# Patient Record
Sex: Male | Born: 1975
Health system: Southern US, Community
[De-identification: ages and names within clinical notes are randomized; demographics above are authoritative.]

## PROBLEM LIST (undated history)

## (undated) DIAGNOSIS — T7840XA Allergy, unspecified, initial encounter: Secondary | ICD-10-CM

## (undated) DIAGNOSIS — M86171 Other acute osteomyelitis, right ankle and foot: Secondary | ICD-10-CM

## (undated) DIAGNOSIS — G629 Polyneuropathy, unspecified: Secondary | ICD-10-CM

## (undated) DIAGNOSIS — M14671 Charcot's joint, right ankle and foot: Secondary | ICD-10-CM

## (undated) DIAGNOSIS — E875 Hyperkalemia: Secondary | ICD-10-CM

## (undated) DIAGNOSIS — N289 Disorder of kidney and ureter, unspecified: Secondary | ICD-10-CM

## (undated) DIAGNOSIS — I10 Essential (primary) hypertension: Secondary | ICD-10-CM

## (undated) DIAGNOSIS — IMO0002 Reserved for concepts with insufficient information to code with codable children: Secondary | ICD-10-CM

## (undated) DIAGNOSIS — E119 Type 2 diabetes mellitus without complications: Secondary | ICD-10-CM

## (undated) DIAGNOSIS — K219 Gastro-esophageal reflux disease without esophagitis: Secondary | ICD-10-CM

## (undated) DIAGNOSIS — E109 Type 1 diabetes mellitus without complications: Secondary | ICD-10-CM

## (undated) DIAGNOSIS — Z5189 Encounter for other specified aftercare: Secondary | ICD-10-CM

## (undated) DIAGNOSIS — L97411 Non-pressure chronic ulcer of right heel and midfoot limited to breakdown of skin: Secondary | ICD-10-CM

## (undated) DIAGNOSIS — G4730 Sleep apnea, unspecified: Secondary | ICD-10-CM

## (undated) DIAGNOSIS — D689 Coagulation defect, unspecified: Secondary | ICD-10-CM

## (undated) HISTORY — DX: Reserved for concepts with insufficient information to code with codable children: IMO0002

## (undated) HISTORY — DX: Disorder of kidney and ureter, unspecified: N28.9

## (undated) HISTORY — DX: Allergy, unspecified, initial encounter: T78.40XA

## (undated) HISTORY — DX: Encounter for other specified aftercare: Z51.89

## (undated) HISTORY — PX: APPENDECTOMY: SHX54

## (undated) HISTORY — DX: Coagulation defect, unspecified: D68.9

## (undated) HISTORY — DX: Gastro-esophageal reflux disease without esophagitis: K21.9

## (undated) HISTORY — PX: ANKLE SURGERY: SHX546

## (undated) HISTORY — PX: EYE SURGERY: SHX253

## (undated) HISTORY — DX: Type 2 diabetes mellitus without complications: E11.9

---

## 2011-03-26 ENCOUNTER — Ambulatory Visit (INDEPENDENT_AMBULATORY_CARE_PROVIDER_SITE_OTHER): Payer: BC Managed Care – PPO | Admitting: Ophthalmology

## 2011-03-26 DIAGNOSIS — H43819 Vitreous degeneration, unspecified eye: Secondary | ICD-10-CM

## 2011-03-26 DIAGNOSIS — H2510 Age-related nuclear cataract, unspecified eye: Secondary | ICD-10-CM

## 2011-03-26 DIAGNOSIS — E11359 Type 2 diabetes mellitus with proliferative diabetic retinopathy without macular edema: Secondary | ICD-10-CM

## 2011-04-25 ENCOUNTER — Other Ambulatory Visit: Payer: Self-pay | Admitting: Internal Medicine

## 2011-04-25 DIAGNOSIS — R7989 Other specified abnormal findings of blood chemistry: Secondary | ICD-10-CM

## 2011-04-25 DIAGNOSIS — R809 Proteinuria, unspecified: Secondary | ICD-10-CM

## 2011-05-02 ENCOUNTER — Ambulatory Visit
Admission: RE | Admit: 2011-05-02 | Discharge: 2011-05-02 | Disposition: A | Discharge status: Home / Self Care | Payer: BC Managed Care – PPO | Source: Ambulatory Visit | Attending: Internal Medicine | Admitting: Internal Medicine

## 2011-05-02 DIAGNOSIS — R809 Proteinuria, unspecified: Secondary | ICD-10-CM

## 2011-05-02 DIAGNOSIS — R7989 Other specified abnormal findings of blood chemistry: Secondary | ICD-10-CM

## 2011-08-13 ENCOUNTER — Other Ambulatory Visit (HOSPITAL_COMMUNITY): Payer: Self-pay | Admitting: *Deleted

## 2011-08-14 ENCOUNTER — Encounter (HOSPITAL_COMMUNITY)
Admission: RE | Admit: 2011-08-14 | Discharge: 2011-08-14 | Disposition: A | Discharge status: Home / Self Care | Payer: BC Managed Care – PPO | Source: Ambulatory Visit | Attending: Nephrology | Admitting: Nephrology

## 2011-08-14 DIAGNOSIS — D509 Iron deficiency anemia, unspecified: Secondary | ICD-10-CM | POA: Insufficient documentation

## 2011-08-14 MED ORDER — FERUMOXYTOL INJECTION 510 MG/17 ML
INTRAVENOUS | Status: AC
  Administered 2011-08-14: 510 mg via INTRAVENOUS
  Filled 2011-08-14: qty 17

## 2011-08-14 MED ORDER — FERUMOXYTOL INJECTION 510 MG/17 ML
510.0000 mg | INTRAVENOUS | Status: DC
  Administered 2011-08-14: 510 mg via INTRAVENOUS

## 2011-08-14 MED ORDER — SODIUM CHLORIDE 0.9 % IV SOLN
INTRAVENOUS | Status: DC
  Administered 2011-08-14: 250 mL via INTRAVENOUS

## 2011-08-21 ENCOUNTER — Encounter (HOSPITAL_COMMUNITY)
Admission: RE | Admit: 2011-08-21 | Discharge: 2011-08-21 | Disposition: A | Discharge status: Home / Self Care | Payer: BC Managed Care – PPO | Source: Ambulatory Visit | Attending: Nephrology | Admitting: Nephrology

## 2011-08-21 MED ORDER — FERUMOXYTOL INJECTION 510 MG/17 ML: 510.0000 mg | INTRAVENOUS | Status: DC

## 2011-08-21 MED ORDER — FERUMOXYTOL INJECTION 510 MG/17 ML
INTRAVENOUS | Status: AC
  Administered 2011-08-21: 510 mg via INTRAVENOUS
  Filled 2011-08-21: qty 17

## 2011-08-21 MED ORDER — SODIUM CHLORIDE 0.9 % IV SOLN
INTRAVENOUS | Status: AC
  Administered 2011-08-21: 09:00:00 via INTRAVENOUS

## 2011-09-24 ENCOUNTER — Ambulatory Visit (INDEPENDENT_AMBULATORY_CARE_PROVIDER_SITE_OTHER): Payer: BC Managed Care – PPO | Admitting: Ophthalmology

## 2011-09-24 DIAGNOSIS — E1065 Type 1 diabetes mellitus with hyperglycemia: Secondary | ICD-10-CM

## 2011-09-24 DIAGNOSIS — H2510 Age-related nuclear cataract, unspecified eye: Secondary | ICD-10-CM

## 2011-09-24 DIAGNOSIS — E1039 Type 1 diabetes mellitus with other diabetic ophthalmic complication: Secondary | ICD-10-CM

## 2011-09-24 DIAGNOSIS — E11359 Type 2 diabetes mellitus with proliferative diabetic retinopathy without macular edema: Secondary | ICD-10-CM

## 2011-09-24 DIAGNOSIS — H43819 Vitreous degeneration, unspecified eye: Secondary | ICD-10-CM

## 2011-09-24 DIAGNOSIS — H2700 Aphakia, unspecified eye: Secondary | ICD-10-CM

## 2012-03-26 ENCOUNTER — Ambulatory Visit (INDEPENDENT_AMBULATORY_CARE_PROVIDER_SITE_OTHER): Payer: BC Managed Care – PPO | Admitting: Ophthalmology

## 2012-03-26 DIAGNOSIS — H35039 Hypertensive retinopathy, unspecified eye: Secondary | ICD-10-CM

## 2012-03-26 DIAGNOSIS — H43819 Vitreous degeneration, unspecified eye: Secondary | ICD-10-CM

## 2012-03-26 DIAGNOSIS — E11359 Type 2 diabetes mellitus with proliferative diabetic retinopathy without macular edema: Secondary | ICD-10-CM

## 2012-03-26 DIAGNOSIS — E1165 Type 2 diabetes mellitus with hyperglycemia: Secondary | ICD-10-CM

## 2012-03-26 DIAGNOSIS — I10 Essential (primary) hypertension: Secondary | ICD-10-CM

## 2012-03-26 DIAGNOSIS — E1139 Type 2 diabetes mellitus with other diabetic ophthalmic complication: Secondary | ICD-10-CM

## 2012-07-21 ENCOUNTER — Other Ambulatory Visit (HOSPITAL_COMMUNITY): Payer: Self-pay

## 2012-07-22 ENCOUNTER — Other Ambulatory Visit (HOSPITAL_COMMUNITY): Payer: Self-pay

## 2012-07-22 DIAGNOSIS — R0989 Other specified symptoms and signs involving the circulatory and respiratory systems: Secondary | ICD-10-CM

## 2012-08-08 ENCOUNTER — Encounter (HOSPITAL_COMMUNITY): Payer: BC Managed Care – PPO

## 2012-08-26 ENCOUNTER — Ambulatory Visit (HOSPITAL_COMMUNITY)
Admission: RE | Admit: 2012-08-26 | Discharge: 2012-08-26 | Disposition: A | Discharge status: Home / Self Care | Payer: BC Managed Care – PPO | Source: Ambulatory Visit | Attending: Cardiovascular Disease | Admitting: Cardiovascular Disease

## 2012-08-26 DIAGNOSIS — R0989 Other specified symptoms and signs involving the circulatory and respiratory systems: Secondary | ICD-10-CM | POA: Insufficient documentation

## 2012-08-26 NOTE — Progress Notes (Signed)
ABIs and lower extremity duplex doppler completed. Aris [place] RVT

## 2012-09-24 ENCOUNTER — Ambulatory Visit (INDEPENDENT_AMBULATORY_CARE_PROVIDER_SITE_OTHER): Payer: BC Managed Care – PPO | Admitting: Ophthalmology

## 2012-09-24 DIAGNOSIS — H43819 Vitreous degeneration, unspecified eye: Secondary | ICD-10-CM

## 2012-09-24 DIAGNOSIS — E1139 Type 2 diabetes mellitus with other diabetic ophthalmic complication: Secondary | ICD-10-CM

## 2012-09-24 DIAGNOSIS — E11359 Type 2 diabetes mellitus with proliferative diabetic retinopathy without macular edema: Secondary | ICD-10-CM

## 2012-09-24 DIAGNOSIS — H35039 Hypertensive retinopathy, unspecified eye: Secondary | ICD-10-CM

## 2012-09-24 DIAGNOSIS — H35379 Puckering of macula, unspecified eye: Secondary | ICD-10-CM

## 2012-09-24 DIAGNOSIS — H2510 Age-related nuclear cataract, unspecified eye: Secondary | ICD-10-CM

## 2012-09-24 DIAGNOSIS — I10 Essential (primary) hypertension: Secondary | ICD-10-CM

## 2013-01-15 ENCOUNTER — Ambulatory Visit
Admission: RE | Admit: 2013-01-15 | Discharge: 2013-01-15 | Disposition: A | Discharge status: Home / Self Care | Payer: BC Managed Care – PPO | Source: Ambulatory Visit | Attending: Nephrology | Admitting: Nephrology

## 2013-01-15 ENCOUNTER — Other Ambulatory Visit: Payer: Self-pay | Admitting: Nephrology

## 2013-01-15 DIAGNOSIS — R509 Fever, unspecified: Secondary | ICD-10-CM

## 2013-01-15 DIAGNOSIS — R059 Cough, unspecified: Secondary | ICD-10-CM

## 2013-01-15 DIAGNOSIS — R05 Cough: Secondary | ICD-10-CM

## 2013-03-27 ENCOUNTER — Ambulatory Visit (INDEPENDENT_AMBULATORY_CARE_PROVIDER_SITE_OTHER): Payer: BC Managed Care – PPO | Admitting: Ophthalmology

## 2013-03-27 DIAGNOSIS — I10 Essential (primary) hypertension: Secondary | ICD-10-CM

## 2013-03-27 DIAGNOSIS — E1139 Type 2 diabetes mellitus with other diabetic ophthalmic complication: Secondary | ICD-10-CM

## 2013-03-27 DIAGNOSIS — H43819 Vitreous degeneration, unspecified eye: Secondary | ICD-10-CM

## 2013-03-27 DIAGNOSIS — H35039 Hypertensive retinopathy, unspecified eye: Secondary | ICD-10-CM

## 2013-03-27 DIAGNOSIS — H35379 Puckering of macula, unspecified eye: Secondary | ICD-10-CM

## 2013-03-27 DIAGNOSIS — H2510 Age-related nuclear cataract, unspecified eye: Secondary | ICD-10-CM

## 2013-03-27 DIAGNOSIS — E11359 Type 2 diabetes mellitus with proliferative diabetic retinopathy without macular edema: Secondary | ICD-10-CM

## 2013-04-14 ENCOUNTER — Ambulatory Visit (INDEPENDENT_AMBULATORY_CARE_PROVIDER_SITE_OTHER): Payer: BC Managed Care – PPO

## 2013-04-14 VITALS — BP 126/75 | HR 77 | Resp 12 | Ht 66.0 in | Wt 198.0 lb

## 2013-04-14 DIAGNOSIS — M79609 Pain in unspecified limb: Secondary | ICD-10-CM

## 2013-04-14 DIAGNOSIS — E1049 Type 1 diabetes mellitus with other diabetic neurological complication: Secondary | ICD-10-CM

## 2013-04-14 DIAGNOSIS — E1040 Type 1 diabetes mellitus with diabetic neuropathy, unspecified: Secondary | ICD-10-CM

## 2013-04-14 DIAGNOSIS — B351 Tinea unguium: Secondary | ICD-10-CM

## 2013-04-14 DIAGNOSIS — E1142 Type 2 diabetes mellitus with diabetic polyneuropathy: Secondary | ICD-10-CM

## 2013-04-14 NOTE — Patient Instructions (Signed)
Diabetes and Foot Care Diabetes may cause you to have problems because of poor blood supply (circulation) to your feet and legs. This may cause the skin on your feet to become thinner, break easier, and heal more slowly. Your skin may become dry, and the skin may peel and crack. You may also have nerve damage in your legs and feet causing decreased feeling in them. You may not notice minor injuries to your feet that could lead to infections or more serious problems. Taking care of your feet is one of the most important things you can do for yourself.  HOME CARE INSTRUCTIONS  Wear shoes at all times, even in the house. Do not go barefoot. Bare feet are easily injured.  Check your feet daily for blisters, cuts, and redness. If you cannot see the bottom of your feet, use a mirror or ask someone for help.  Wash your feet with warm water (do not use hot water) and mild soap. Then pat your feet and the areas between your toes until they are completely dry. Do not soak your feet as this can dry your skin.  Apply a moisturizing lotion or petroleum jelly (that does not contain alcohol and is unscented) to the skin on your feet and to dry, brittle toenails. Do not apply lotion between your toes.  Trim your toenails straight across. Do not dig under them or around the cuticle. File the edges of your nails with an emery board or nail file.  Do not cut corns or calluses or try to remove them with medicine.  Wear clean socks or stockings every day. Make sure they are not too tight. Do not wear knee-high stockings since they may decrease blood flow to your legs.  Wear shoes that fit properly and have enough cushioning. To break in new shoes, wear them for just a few hours a day. This prevents you from injuring your feet. Always look in your shoes before you put them on to be sure there are no objects inside.  Do not cross your legs. This may decrease the blood flow to your feet.  If you find a minor scrape,  cut, or break in the skin on your feet, keep it and the skin around it clean and dry. These areas may be cleansed with mild soap and water. Do not cleanse the area with peroxide, alcohol, or iodine.  When you remove an adhesive bandage, be sure not to damage the skin around it.  If you have a wound, look at it several times a day to make sure it is healing.  Do not use heating pads or hot water bottles. They may burn your skin. If you have lost feeling in your feet or legs, you may not know it is happening until it is too late.  Make sure your health care provider performs a complete foot exam at least annually or more often if you have foot problems. Report any cuts, sores, or bruises to your health care provider immediately. SEEK MEDICAL CARE IF:   You have an injury that is not healing.  You have cuts or breaks in the skin.  You have an ingrown nail.  You notice redness on your legs or feet.  You feel burning or tingling in your legs or feet.  You have pain or cramps in your legs and feet.  Your legs or feet are numb.  Your feet always feel cold. SEEK IMMEDIATE MEDICAL CARE IF:   There is increasing redness,   swelling, or pain in or around a wound.  There is a red line that goes up your leg.  Pus is coming from a wound.  You develop a fever or as directed by your health care provider.  You notice a bad smell coming from an ulcer or wound. Document Released: 05/18/2000 Document Revised: 01/21/2013 Document Reviewed: 10/28/2012 ExitCare Patient Information 2014 [employer].  

## 2013-04-14 NOTE — Progress Notes (Signed)
  Subjective:    Patient ID: Javier Hunt, male    DOB: July 27, 1975, 37 y.o.   MRN: [id number]  HPI Comments: '' toenails trim''     Review of Systems  Respiratory: Positive for wheezing.   All other systems reviewed and are negative.       Objective:   Physical Exam Patient's last A1c was 8.6 continues to have difficulty with his diabetes. Patient is on insulin pump insulin-dependent diabetes. The extremity objective findings as follows vascular status intact DP postal for PT pulse one over 4 bilateral Refill timed 3-4 seconds all digits neurologically epicritic and proprioceptive sensations significantly diminished on Semmes Weinstein testing to forefoot and plantar digits and plantar arch bilateral. DTRs not elicited. There is normal plantar response noted. Dermatologically nails thick friable yellow discolored and brittle, there is also some dry scaling skin.       Assessment & Plan:  Diabetes with peripheral neuropathy no changes medication her health status continues to have difficulty with control, patient works third shifts has difficulty with his diet and medication scheduled. Continues to have thick brittle friable mycotic nails debrided x10 the presence of diabetes cocking factors as well as onychomycosis painful and tender nails debrided reappointed 3 months for continued palliative diabetic foot and nail care  Harriet Masson DPM

## 2013-06-16 ENCOUNTER — Ambulatory Visit (INDEPENDENT_AMBULATORY_CARE_PROVIDER_SITE_OTHER): Payer: BC Managed Care – PPO

## 2013-06-16 VITALS — BP 121/74 | HR 73 | Resp 16 | Ht 66.0 in | Wt 194.0 lb

## 2013-06-16 DIAGNOSIS — IMO0002 Reserved for concepts with insufficient information to code with codable children: Secondary | ICD-10-CM

## 2013-06-16 DIAGNOSIS — M79609 Pain in unspecified limb: Secondary | ICD-10-CM

## 2013-06-16 DIAGNOSIS — E1142 Type 2 diabetes mellitus with diabetic polyneuropathy: Secondary | ICD-10-CM

## 2013-06-16 DIAGNOSIS — E1065 Type 1 diabetes mellitus with hyperglycemia: Secondary | ICD-10-CM

## 2013-06-16 DIAGNOSIS — B351 Tinea unguium: Secondary | ICD-10-CM

## 2013-06-16 DIAGNOSIS — E1040 Type 1 diabetes mellitus with diabetic neuropathy, unspecified: Secondary | ICD-10-CM

## 2013-06-16 DIAGNOSIS — E1049 Type 1 diabetes mellitus with other diabetic neurological complication: Secondary | ICD-10-CM

## 2013-06-16 NOTE — Progress Notes (Signed)
   Subjective:    Patient ID: Javier Hunt, male    DOB: 04/09/1976, 38 y.o.   MRN: [id number]  HPI Comments: Toenail clipping today.     Review of Systems no new changes or findings     Objective:   Physical Exam Neurovascular status is intact pedal pulses palpable DP +2/4 bilateral PT plus one over 4 bilateral. Capillary refill time 4 seconds all digits. Neurologically epicritic and proprioceptive sensations intact although diminished on Semmes Weinstein testing to the digits and plantar forefoot. There is normal plantar response DTRs not listed. Dermatologically skin color pigment and hair growth are normal nails thick yellow brittle discolored friable consistent with onychomycosis. They're painful tender both on debridement and with enclosed shoe wear and ambulation. Patient been using Naftin cream for the athletes foot and this completed suggested topical antifungal such as over-the-counter Fungi-Nail to the affected nails twice daily for 12 months duration.       Assessment & Plan:  Assessment diabetes with peripheral neuropathy and angiopathy. Mycotic friable discolored nails debrided x10 the presence of diabetes and complications initiate topical antifungal therapy with Fungi-Nail recheck in 3 months for continued palliative nail care.  Harriet Masson DPM

## 2013-06-16 NOTE — Patient Instructions (Signed)
Onychomycosis/Fungal Toenails  WHAT IS IT? An infection that lies within the keratin of your nail plate that is caused by a fungus.  WHY ME? Fungal infections affect all ages, sexes, races, and creeds.  There may be many factors that predispose you to a fungal infection such as age, coexisting medical conditions such as diabetes, or an autoimmune disease; stress, medications, fatigue, genetics, etc.  Bottom line: fungus thrives in a warm, moist environment and your shoes offer such a location.  IS IT CONTAGIOUS? Theoretically, yes.  You do not want to share shoes, nail clippers or files with someone who has fungal toenails.  Walking around barefoot in the same room or sleeping in the same bed is unlikely to transfer the organism.  It is important to realize, however, that fungus can spread easily from one nail to the next on the same foot.  HOW DO WE TREAT THIS?  There are several ways to treat this condition.  Treatment may depend on many factors such as age, medications, pregnancy, liver and kidney conditions, etc.  It is best to ask your doctor which options are available to you.  1. No treatment.   Unlike many other medical concerns, you can live with this condition.  However for many people this can be a painful condition and may lead to ingrown toenails or a bacterial infection.  It is recommended that you keep the nails cut short to help reduce the amount of fungal nail. 2. Topical treatment.  These range from herbal remedies to prescription strength nail lacquers.  About 40-50% effective, topicals require twice daily application for approximately 9 to 12 months or until an entirely new nail has grown out.  The most effective topicals are medical grade medications available through physicians offices. 3. Oral antifungal medications.  With an 80-90% cure rate, the most common oral medication requires 3 to 4 months of therapy and stays in your system for a year as the new nail grows out.  Oral  antifungal medications do require blood work to make sure it is a safe drug for you.  A liver function panel will be performed prior to starting the medication and after the first month of treatment.  It is important to have the blood work performed to avoid any harmful side effects.  In general, this medication safe but blood work is required. 4. Laser Therapy.  This treatment is performed by applying a specialized laser to the affected nail plate.  This therapy is noninvasive, fast, and non-painful.  It is not covered by insurance and is therefore, out of pocket.  The results have been very good with a 80-95% cure rate.  The [facility] is the only practice in the area to offer this therapy. 5. Permanent Nail Avulsion.  Removing the entire nail so that a new nail will not grow back.  Recommendations to obtain over-the-counter topical antifungal such as Fungi-Nail, can be obtained at any pharmacy without prescription. Apply the medication twice daily to the affected nails for 12 months duration apply a very thin coat each affected nails every morning every evening after bath or shower reapply again. Discontinue only for causing skin irritation or rash it will take a minimum of one year to begin improving the nails.

## 2013-09-08 ENCOUNTER — Ambulatory Visit: Payer: BC Managed Care – PPO

## 2013-09-28 ENCOUNTER — Ambulatory Visit (INDEPENDENT_AMBULATORY_CARE_PROVIDER_SITE_OTHER): Payer: BC Managed Care – PPO | Admitting: Ophthalmology

## 2013-09-28 DIAGNOSIS — H43819 Vitreous degeneration, unspecified eye: Secondary | ICD-10-CM

## 2013-09-28 DIAGNOSIS — H35039 Hypertensive retinopathy, unspecified eye: Secondary | ICD-10-CM

## 2013-09-28 DIAGNOSIS — E11359 Type 2 diabetes mellitus with proliferative diabetic retinopathy without macular edema: Secondary | ICD-10-CM

## 2013-09-28 DIAGNOSIS — E1039 Type 1 diabetes mellitus with other diabetic ophthalmic complication: Secondary | ICD-10-CM

## 2013-09-28 DIAGNOSIS — H35379 Puckering of macula, unspecified eye: Secondary | ICD-10-CM

## 2013-09-28 DIAGNOSIS — H2510 Age-related nuclear cataract, unspecified eye: Secondary | ICD-10-CM

## 2013-09-28 DIAGNOSIS — I10 Essential (primary) hypertension: Secondary | ICD-10-CM

## 2013-09-28 DIAGNOSIS — E1065 Type 1 diabetes mellitus with hyperglycemia: Secondary | ICD-10-CM

## 2013-10-06 ENCOUNTER — Ambulatory Visit (INDEPENDENT_AMBULATORY_CARE_PROVIDER_SITE_OTHER): Payer: BC Managed Care – PPO

## 2013-10-06 VITALS — BP 102/56 | HR 75 | Resp 16

## 2013-10-06 DIAGNOSIS — E1040 Type 1 diabetes mellitus with diabetic neuropathy, unspecified: Secondary | ICD-10-CM

## 2013-10-06 DIAGNOSIS — M79609 Pain in unspecified limb: Secondary | ICD-10-CM

## 2013-10-06 DIAGNOSIS — E1049 Type 1 diabetes mellitus with other diabetic neurological complication: Secondary | ICD-10-CM

## 2013-10-06 DIAGNOSIS — B351 Tinea unguium: Secondary | ICD-10-CM

## 2013-10-06 DIAGNOSIS — E1065 Type 1 diabetes mellitus with hyperglycemia: Secondary | ICD-10-CM

## 2013-10-06 DIAGNOSIS — IMO0002 Reserved for concepts with insufficient information to code with codable children: Secondary | ICD-10-CM

## 2013-10-06 DIAGNOSIS — E1142 Type 2 diabetes mellitus with diabetic polyneuropathy: Secondary | ICD-10-CM

## 2013-10-06 MED ORDER — TAVABOROLE 5 % EX SOLN: CUTANEOUS | Status: DC

## 2013-10-06 NOTE — Progress Notes (Signed)
   Subjective:    Patient ID: Javier Hunt, male    DOB: 1975-11-19, 38 y.o.   MRN: [id number]  HPI Comments: "Trim the toenails"     Review of Systems no new systemic changes or findings noted    Objective:   Physical Exam Lower extremity objective findings follows vascular status is intact with DP +2/4 PT plus one over 4 bilateral capillary refill time 4 seconds all digits epicritic and proprioceptive sensations decreased on Semmes Weinstein testing to the toes and plantar arch. There is thickening friability discoloration nails with dark discoloration and brittleness hallux through fifth digits bilateral painful tender symptomatically on palpation and with ambulation include shoe wear. Patient been using topical Naftin cream we discussed Fungi-Nail my recommendation is that is a prescription prescription for Cranford Mon [facility] patient will initiate daily treatments for the next 12 months for the fungus nail       Assessment & Plan:  Assessment diabetes with peripheral neuropathy and complications painful mycotic dystrophic brittle nails debrided 1 through 5 bilateral also need keratin is forwarded to the pharmacy reappointed 3 months for continued palliative nail care as needed  Harriet Masson DPM

## 2013-10-06 NOTE — Patient Instructions (Signed)
Diabetes and Foot Care Diabetes may cause you to have problems because of poor blood supply (circulation) to your feet and legs. This may cause the skin on your feet to become thinner, break easier, and heal more slowly. Your skin may become dry, and the skin may peel and crack. You may also have nerve damage in your legs and feet causing decreased feeling in them. You may not notice minor injuries to your feet that could lead to infections or more serious problems. Taking care of your feet is one of the most important things you can do for yourself.  HOME CARE INSTRUCTIONS  Wear shoes at all times, even in the house. Do not go barefoot. Bare feet are easily injured.  Check your feet daily for blisters, cuts, and redness. If you cannot see the bottom of your feet, use a mirror or ask someone for help.  Wash your feet with warm water (do not use hot water) and mild soap. Then pat your feet and the areas between your toes until they are completely dry. Do not soak your feet as this can dry your skin.  Apply a moisturizing lotion or petroleum jelly (that does not contain alcohol and is unscented) to the skin on your feet and to dry, brittle toenails. Do not apply lotion between your toes.  Trim your toenails straight across. Do not dig under them or around the cuticle. File the edges of your nails with an emery board or nail file.  Do not cut corns or calluses or try to remove them with medicine.  Wear clean socks or stockings every day. Make sure they are not too tight. Do not wear knee-high stockings since they may decrease blood flow to your legs.  Wear shoes that fit properly and have enough cushioning. To break in new shoes, wear them for just a few hours a day. This prevents you from injuring your feet. Always look in your shoes before you put them on to be sure there are no objects inside.  Do not cross your legs. This may decrease the blood flow to your feet.  If you find a minor scrape,  cut, or break in the skin on your feet, keep it and the skin around it clean and dry. These areas may be cleansed with mild soap and water. Do not cleanse the area with peroxide, alcohol, or iodine.  When you remove an adhesive bandage, be sure not to damage the skin around it.  If you have a wound, look at it several times a day to make sure it is healing.  Do not use heating pads or hot water bottles. They may burn your skin. If you have lost feeling in your feet or legs, you may not know it is happening until it is too late.  Make sure your health care provider performs a complete foot exam at least annually or more often if you have foot problems. Report any cuts, sores, or bruises to your health care provider immediately. SEEK MEDICAL CARE IF:   You have an injury that is not healing.  You have cuts or breaks in the skin.  You have an ingrown nail.  You notice redness on your legs or feet.  You feel burning or tingling in your legs or feet.  You have pain or cramps in your legs and feet.  Your legs or feet are numb.  Your feet always feel cold. SEEK IMMEDIATE MEDICAL CARE IF:   There is increasing redness,   swelling, or pain in or around a wound.  There is a red line that goes up your leg.  Pus is coming from a wound.  You develop a fever or as directed by your health care provider.  You notice a bad smell coming from an ulcer or wound. Document Released: 05/18/2000 Document Revised: 01/21/2013 Document Reviewed: 10/28/2012 ExitCare Patient Information 2014 [employer].  

## 2014-01-12 ENCOUNTER — Ambulatory Visit (INDEPENDENT_AMBULATORY_CARE_PROVIDER_SITE_OTHER): Payer: BC Managed Care – PPO

## 2014-01-12 DIAGNOSIS — E1049 Type 1 diabetes mellitus with other diabetic neurological complication: Secondary | ICD-10-CM

## 2014-01-12 DIAGNOSIS — E1040 Type 1 diabetes mellitus with diabetic neuropathy, unspecified: Secondary | ICD-10-CM

## 2014-01-12 DIAGNOSIS — E1065 Type 1 diabetes mellitus with hyperglycemia: Secondary | ICD-10-CM

## 2014-01-12 DIAGNOSIS — M79609 Pain in unspecified limb: Secondary | ICD-10-CM

## 2014-01-12 DIAGNOSIS — B351 Tinea unguium: Secondary | ICD-10-CM

## 2014-01-12 DIAGNOSIS — M79606 Pain in leg, unspecified: Secondary | ICD-10-CM

## 2014-01-12 DIAGNOSIS — E1142 Type 2 diabetes mellitus with diabetic polyneuropathy: Secondary | ICD-10-CM

## 2014-01-12 DIAGNOSIS — IMO0002 Reserved for concepts with insufficient information to code with codable children: Secondary | ICD-10-CM

## 2014-01-12 NOTE — Progress Notes (Signed)
   Subjective:    Patient ID: Javier Hunt, male    DOB: [birth year], 38 y.o.   MRN: [id number]  HPI Comments: Pt presents for debridement of 10 toenails.     Review of Systems no new findings or systemic changes     Objective:   Physical Exam Lower extremity objective findings reveal pedal pulses palpable DP +2/4 bilateral PT one over 4 bilateral capillary refill time 3 seconds decreased sensation Semmes Weinstein to forefoot and digits there is normal plantar response DTRs not elicited neurologically skin color pigment normal hair growth absent nails thick brittle friable crumbly and dystrophic. Patient is been applying topical Kerydin as instructed advised to continue to do so for leaks 12 months duration it may be some early proximal clearing noted no open wounds or ulcers no secondary infection is noted current time mild flexible digital contractures noted       Assessment & Plan:  Assessment diabetes with peripheral neuropathy and complications painful mycotic dystrophic nails 1 through 5 bilateral debridement at this time return in 3 months for continued palliative care in the future. Continue topical Kerydin applications as instructed next  [employer] DPM

## 2014-01-12 NOTE — Patient Instructions (Signed)

## 2014-04-01 ENCOUNTER — Ambulatory Visit (INDEPENDENT_AMBULATORY_CARE_PROVIDER_SITE_OTHER): Payer: BC Managed Care – PPO | Admitting: Ophthalmology

## 2014-04-01 DIAGNOSIS — H35033 Hypertensive retinopathy, bilateral: Secondary | ICD-10-CM

## 2014-04-01 DIAGNOSIS — I10 Essential (primary) hypertension: Secondary | ICD-10-CM

## 2014-04-01 DIAGNOSIS — H43813 Vitreous degeneration, bilateral: Secondary | ICD-10-CM

## 2014-04-01 DIAGNOSIS — H35371 Puckering of macula, right eye: Secondary | ICD-10-CM

## 2014-04-01 DIAGNOSIS — E10351 Type 1 diabetes mellitus with proliferative diabetic retinopathy with macular edema: Secondary | ICD-10-CM

## 2014-04-01 DIAGNOSIS — E10359 Type 1 diabetes mellitus with proliferative diabetic retinopathy without macular edema: Secondary | ICD-10-CM

## 2014-04-01 DIAGNOSIS — E10311 Type 1 diabetes mellitus with unspecified diabetic retinopathy with macular edema: Secondary | ICD-10-CM

## 2014-04-20 ENCOUNTER — Ambulatory Visit (INDEPENDENT_AMBULATORY_CARE_PROVIDER_SITE_OTHER): Payer: BC Managed Care – PPO

## 2014-04-20 VITALS — BP 108/68 | HR 77 | Resp 18

## 2014-04-20 DIAGNOSIS — B351 Tinea unguium: Secondary | ICD-10-CM

## 2014-04-20 DIAGNOSIS — E1041 Type 1 diabetes mellitus with diabetic mononeuropathy: Secondary | ICD-10-CM

## 2014-04-20 DIAGNOSIS — E1065 Type 1 diabetes mellitus with hyperglycemia: Secondary | ICD-10-CM

## 2014-04-20 DIAGNOSIS — M79606 Pain in leg, unspecified: Secondary | ICD-10-CM

## 2014-04-20 DIAGNOSIS — E1040 Type 1 diabetes mellitus with diabetic neuropathy, unspecified: Secondary | ICD-10-CM

## 2014-04-20 DIAGNOSIS — IMO0002 Reserved for concepts with insufficient information to code with codable children: Secondary | ICD-10-CM

## 2014-04-20 NOTE — Progress Notes (Signed)
   Subjective:    Patient ID: Javier Hunt, male    DOB: [birth year], 38 y.o.   MRN: [id number]  HPI i am here to get my toenails trimmed up    Review of Systemsno new findings or systemic changes noted     Objective:   Physical Exam Lower extremity objective findings reveal vascular status intact DP +2 PT 1 over 4 bilateral capillary refill time 3 seconds. Normal plantar response DTRs not elicited decreased sensation to the forefoot digits and arch. Nails thick crumbly friable dystrophic 1 through 5 bilateral. No open wounds no ulcers no secondary infections. Mild digital contractures slight hammertoe deformities noted bilateral       Assessment & Plan:  Assessment diabetes with history peripheral neuropathy and complications patient does have painful mycotic dystrophic nails 1 through 5 bilateral which are debrided at this time. Recommended 3 month follow-up for palliative care in the future use topical antifungal as instructed follow-up in 3 months  Harriet Masson DPM

## 2014-06-23 ENCOUNTER — Telehealth: Payer: Self-pay | Admitting: *Deleted

## 2014-06-23 NOTE — Telephone Encounter (Signed)
"  I'm calling to check on the status of her authorization for Kerydin."  We just sent the information to [facility].  We are waiting on a response.  "Okay, it is still pending.  Thank you very much."

## 2014-06-23 NOTE — Telephone Encounter (Signed)
Request for authorization of Cranford Mon was sent to [facility] for approval.  Diagnosis is Onychomycosis. Patient has tried Fungi Nail and Kerydin in the past.  She is Diabetic with Neuropathy.  Cannot take oral medications, already on medications that can make liver toxic.  Nails are friable, brittle and discolored.  Patient has tried OTC medications, vinegar, listerine, etc with no success per Dr. Blenda Mounts.

## 2014-07-12 NOTE — Telephone Encounter (Signed)
[employer] sent a request for documentation to review to make decision about authorization of Kerydin.  I faxed the chart notes.

## 2014-07-20 ENCOUNTER — Ambulatory Visit (INDEPENDENT_AMBULATORY_CARE_PROVIDER_SITE_OTHER): Payer: BLUE CROSS/BLUE SHIELD

## 2014-07-20 VITALS — BP 106/62 | HR 76 | Resp 12

## 2014-07-20 DIAGNOSIS — E1065 Type 1 diabetes mellitus with hyperglycemia: Secondary | ICD-10-CM

## 2014-07-20 DIAGNOSIS — IMO0002 Reserved for concepts with insufficient information to code with codable children: Secondary | ICD-10-CM

## 2014-07-20 DIAGNOSIS — M79676 Pain in unspecified toe(s): Secondary | ICD-10-CM

## 2014-07-20 DIAGNOSIS — E1041 Type 1 diabetes mellitus with diabetic mononeuropathy: Secondary | ICD-10-CM

## 2014-07-20 DIAGNOSIS — E1040 Type 1 diabetes mellitus with diabetic neuropathy, unspecified: Secondary | ICD-10-CM

## 2014-07-20 DIAGNOSIS — B351 Tinea unguium: Secondary | ICD-10-CM

## 2014-07-20 DIAGNOSIS — M79606 Pain in leg, unspecified: Secondary | ICD-10-CM

## 2014-07-20 NOTE — Progress Notes (Signed)
   Subjective:    Patient ID: Savir Erdmann, male    DOB: Feb 12, 1976, 39 y.o.   MRN: [id number]  HPI patient presents this time for nail care Toenails trim.  Review of Systems no new findings or changes noted     Objective:   Physical Exam Vascular status appears to be intact DP +2 PT 1 over 4 bilateral Refill time 3 seconds. Plantar response DTRs not noted. Dermatologic the skin color pigment normal hair growth absent nails thick crumbly friable discolored 1 through 5 bilateral. No infections no open wounds no ulcers no secondary infections.       Assessment & Plan:  Assessment diabetes history peripheral neuropathy and angiopathy. Painful mycotic nails debrided 10 for future palliative care is needed patient should note his blood sugars been fluctuating last A1c back in January was 8.1 likely due to the holidays elevating his blood sugars. You monitor recheck in 3 months as recommended  Harriet Masson DPM

## 2014-07-20 NOTE — Patient Instructions (Signed)
Diabetes and Foot Care Diabetes may cause you to have problems because of poor blood supply (circulation) to your feet and legs. This may cause the skin on your feet to become thinner, break easier, and heal more slowly. Your skin may become dry, and the skin may peel and crack. You may also have nerve damage in your legs and feet causing decreased feeling in them. You may not notice minor injuries to your feet that could lead to infections or more serious problems. Taking care of your feet is one of the most important things you can do for yourself.  HOME CARE INSTRUCTIONS  Wear shoes at all times, even in the house. Do not go barefoot. Bare feet are easily injured.  Check your feet daily for blisters, cuts, and redness. If you cannot see the bottom of your feet, use a mirror or ask someone for help.  Wash your feet with warm water (do not use hot water) and mild soap. Then pat your feet and the areas between your toes until they are completely dry. Do not soak your feet as this can dry your skin.  Apply a moisturizing lotion or petroleum jelly (that does not contain alcohol and is unscented) to the skin on your feet and to dry, brittle toenails. Do not apply lotion between your toes.  Trim your toenails straight across. Do not dig under them or around the cuticle. File the edges of your nails with an emery board or nail file.  Do not cut corns or calluses or try to remove them with medicine.  Wear clean socks or stockings every day. Make sure they are not too tight. Do not wear knee-high stockings since they may decrease blood flow to your legs.  Wear shoes that fit properly and have enough cushioning. To break in new shoes, wear them for just a few hours a day. This prevents you from injuring your feet. Always look in your shoes before you put them on to be sure there are no objects inside.  Do not cross your legs. This may decrease the blood flow to your feet.  If you find a minor scrape,  cut, or break in the skin on your feet, keep it and the skin around it clean and dry. These areas may be cleansed with mild soap and water. Do not cleanse the area with peroxide, alcohol, or iodine.  When you remove an adhesive bandage, be sure not to damage the skin around it.  If you have a wound, look at it several times a day to make sure it is healing.  Do not use heating pads or hot water bottles. They may burn your skin. If you have lost feeling in your feet or legs, you may not know it is happening until it is too late.  Make sure your health care provider performs a complete foot exam at least annually or more often if you have foot problems. Report any cuts, sores, or bruises to your health care provider immediately. SEEK MEDICAL CARE IF:   You have an injury that is not healing.  You have cuts or breaks in the skin.  You have an ingrown nail.  You notice redness on your legs or feet.  You feel burning or tingling in your legs or feet.  You have pain or cramps in your legs and feet.  Your legs or feet are numb.  Your feet always feel cold. SEEK IMMEDIATE MEDICAL CARE IF:   There is increasing redness,   swelling, or pain in or around a wound.  There is a red line that goes up your leg.  Pus is coming from a wound.  You develop a fever or as directed by your health care provider.  You notice a bad smell coming from an ulcer or wound. Document Released: 05/18/2000 Document Revised: 01/21/2013 Document Reviewed: 10/28/2012 Exit[employer] Patient Information 2015 [employer]. This information is not intended to replace advice given to you by your health care provider. Make sure you discuss any questions you have with your health care provider.  

## 2014-08-03 ENCOUNTER — Telehealth: Payer: Self-pay | Admitting: *Deleted

## 2014-08-03 NOTE — Telephone Encounter (Signed)
Patient will need to be placed on Penlac. Offer to prevent: A prescription for Penlac nail lacquer 6.6 mL bottle with 4 refills. Apply once daily to affected nails for 12 months removed once weekly or every 7 days using alcohol and repeat daily applications thereafter. Patient needs to try one of these generic for Penlac treatments rather than other options.  Harriet Masson DPM

## 2014-08-03 NOTE — Telephone Encounter (Signed)
Prescription for Cranford Mon was denied.  Diagnosis of Onychomycosis is needed as well as failure of Penlac, Sporanox or Lamisil.

## 2014-08-04 ENCOUNTER — Telehealth: Payer: Self-pay | Admitting: *Deleted

## 2014-08-04 MED ORDER — CICLOPIROX 8 % EX SOLN: Freq: Every day | CUTANEOUS | Status: DC

## 2014-08-04 NOTE — Telephone Encounter (Signed)
I left a message for the patient to call me back.  Need to inform him Cranford Mon was denied authorization from his insurance.  Dr. Blenda Mounts wants to prescribe Penlac.

## 2014-08-09 MED ORDER — CICLOPIROX 8 % EX SOLN: Freq: Every day | CUTANEOUS | Status: DC

## 2014-08-09 NOTE — Telephone Encounter (Signed)
I called and informed patient that his insurance denied Kerydin.  Dr. Blenda Mounts wants to prescribe Penlac which is an alternative that your insurance recommended.  Would you like to get this medication?  It is a topical as well.  "Yes, send it to [facility] on [facility]."  I will send it now.

## 2014-10-04 ENCOUNTER — Ambulatory Visit (INDEPENDENT_AMBULATORY_CARE_PROVIDER_SITE_OTHER): Payer: BLUE CROSS/BLUE SHIELD | Admitting: Ophthalmology

## 2014-10-04 DIAGNOSIS — E10319 Type 1 diabetes mellitus with unspecified diabetic retinopathy without macular edema: Secondary | ICD-10-CM | POA: Diagnosis not present

## 2014-10-04 DIAGNOSIS — E10359 Type 1 diabetes mellitus with proliferative diabetic retinopathy without macular edema: Secondary | ICD-10-CM

## 2014-10-04 DIAGNOSIS — H35373 Puckering of macula, bilateral: Secondary | ICD-10-CM | POA: Diagnosis not present

## 2014-10-04 DIAGNOSIS — H35033 Hypertensive retinopathy, bilateral: Secondary | ICD-10-CM

## 2014-10-04 DIAGNOSIS — H43813 Vitreous degeneration, bilateral: Secondary | ICD-10-CM

## 2014-10-04 DIAGNOSIS — H2513 Age-related nuclear cataract, bilateral: Secondary | ICD-10-CM

## 2014-10-04 DIAGNOSIS — I10 Essential (primary) hypertension: Secondary | ICD-10-CM | POA: Diagnosis not present

## 2014-10-19 ENCOUNTER — Ambulatory Visit (INDEPENDENT_AMBULATORY_CARE_PROVIDER_SITE_OTHER): Payer: BLUE CROSS/BLUE SHIELD | Admitting: Podiatry

## 2014-10-19 DIAGNOSIS — B351 Tinea unguium: Secondary | ICD-10-CM

## 2014-10-19 DIAGNOSIS — M79606 Pain in leg, unspecified: Secondary | ICD-10-CM | POA: Diagnosis not present

## 2014-10-19 MED ORDER — CICLOPIROX 8 % EX SOLN: Freq: Every day | CUTANEOUS | Status: DC

## 2014-10-19 NOTE — Progress Notes (Signed)
   Subjective:    Patient ID: Javier Hunt, male    DOB: 07-17-1975, 39 y.o.   MRN: [id number]  HPI Comments: Pt states he would like to have his toenails clipped, and he would like the Penlac called into the [facility] at [facility].  Pt states he hasn't used the Penlac because is was called into the wrong pharmacy.   HPI Presents today chief complaint of painful elongated toenails.  Objective: Pulses are palpable bilateral nails are thick, yellow dystrophic onychomycosis and painful palpation.   Assessment: Onychomycosis with pain in limb.  Plan: Treatment of nails in thickness and length as covered service secondary to pain.   Review of Systems     Objective:   Physical Exam        Assessment & Plan:

## 2015-01-18 ENCOUNTER — Ambulatory Visit: Payer: BLUE CROSS/BLUE SHIELD | Admitting: Podiatry

## 2015-01-20 ENCOUNTER — Encounter: Payer: Self-pay | Admitting: Podiatry

## 2015-01-20 ENCOUNTER — Ambulatory Visit (INDEPENDENT_AMBULATORY_CARE_PROVIDER_SITE_OTHER): Payer: BLUE CROSS/BLUE SHIELD | Admitting: Podiatry

## 2015-01-20 DIAGNOSIS — M79676 Pain in unspecified toe(s): Secondary | ICD-10-CM

## 2015-01-20 DIAGNOSIS — B351 Tinea unguium: Secondary | ICD-10-CM | POA: Diagnosis not present

## 2015-01-20 NOTE — Progress Notes (Signed)
He presents today with a chief complaint of painful elongated toenails and like to have them cut.  Objective: vital signs stable he's alert and oriented 3 pulses are palpable bilateral. His toenails are thick yellow dystrophic, mycotic and painful on palpation as well as debridement. No open wounds no other lesions noted.  Assessment: Pain at limbs secondary to onychomycosis 1 through 5 bilateral.  Plan: Debridement of nails 1 through 5 bilateral as a covered service secondary to pain bilateral foot. Follow-up with him in 3 months for reevaluation.

## 2015-04-06 ENCOUNTER — Ambulatory Visit (INDEPENDENT_AMBULATORY_CARE_PROVIDER_SITE_OTHER): Payer: BLUE CROSS/BLUE SHIELD | Admitting: Ophthalmology

## 2015-04-06 DIAGNOSIS — E11311 Type 2 diabetes mellitus with unspecified diabetic retinopathy with macular edema: Secondary | ICD-10-CM | POA: Diagnosis not present

## 2015-04-06 DIAGNOSIS — H35033 Hypertensive retinopathy, bilateral: Secondary | ICD-10-CM

## 2015-04-06 DIAGNOSIS — I10 Essential (primary) hypertension: Secondary | ICD-10-CM

## 2015-04-06 DIAGNOSIS — E113511 Type 2 diabetes mellitus with proliferative diabetic retinopathy with macular edema, right eye: Secondary | ICD-10-CM

## 2015-04-06 DIAGNOSIS — H43813 Vitreous degeneration, bilateral: Secondary | ICD-10-CM | POA: Diagnosis not present

## 2015-04-06 DIAGNOSIS — E113592 Type 2 diabetes mellitus with proliferative diabetic retinopathy without macular edema, left eye: Secondary | ICD-10-CM | POA: Diagnosis not present

## 2015-04-14 ENCOUNTER — Ambulatory Visit: Payer: BLUE CROSS/BLUE SHIELD | Admitting: Podiatry

## 2015-04-19 ENCOUNTER — Ambulatory Visit (INDEPENDENT_AMBULATORY_CARE_PROVIDER_SITE_OTHER): Payer: BLUE CROSS/BLUE SHIELD | Admitting: Podiatry

## 2015-04-19 ENCOUNTER — Encounter: Payer: Self-pay | Admitting: Podiatry

## 2015-04-19 VITALS — BP 142/76 | HR 72 | Resp 16

## 2015-04-19 DIAGNOSIS — M79676 Pain in unspecified toe(s): Secondary | ICD-10-CM

## 2015-04-19 DIAGNOSIS — B351 Tinea unguium: Secondary | ICD-10-CM | POA: Diagnosis not present

## 2015-04-19 NOTE — Progress Notes (Signed)
He presents today with chief complaint of painful elongated toenails 1 through 5 bilateral.  Objective: Vital signs are stable he is alert and oriented 3. Toenails are thick yellow dystrophic with mycotic severely elongated painful palpation as well as debridement.  Assessment: Diabetes mellitus1 with pain in limb secondary to diabetic peripheral neuropathy and onychomycosis bilateral.  Plan: Debridement of nails 1 through 5 bilateral covered service secondary to pain.

## 2015-07-26 ENCOUNTER — Ambulatory Visit (INDEPENDENT_AMBULATORY_CARE_PROVIDER_SITE_OTHER): Payer: Managed Care, Other (non HMO) | Admitting: Podiatry

## 2015-07-26 ENCOUNTER — Encounter: Payer: Self-pay | Admitting: Podiatry

## 2015-07-26 DIAGNOSIS — B351 Tinea unguium: Secondary | ICD-10-CM

## 2015-07-26 DIAGNOSIS — M79676 Pain in unspecified toe(s): Secondary | ICD-10-CM

## 2015-07-26 NOTE — Progress Notes (Signed)
He presents today with a chief complaint of painful elongated toenails.  Objective: Vital signs are stable he is alert and oriented 3. Pulses are palpable. Toenails are thick yellow dystrophic onychomycotic and painful palpation.  Assessment: Pain in limb secondary to onychomycosis.  Plan: Debridement of toenails 1 through 5 bilateral cover service secondary to pain.

## 2015-10-12 ENCOUNTER — Ambulatory Visit (INDEPENDENT_AMBULATORY_CARE_PROVIDER_SITE_OTHER): Payer: Managed Care, Other (non HMO) | Admitting: Ophthalmology

## 2015-10-12 DIAGNOSIS — E103592 Type 1 diabetes mellitus with proliferative diabetic retinopathy without macular edema, left eye: Secondary | ICD-10-CM

## 2015-10-12 DIAGNOSIS — H35373 Puckering of macula, bilateral: Secondary | ICD-10-CM | POA: Diagnosis not present

## 2015-10-12 DIAGNOSIS — H35033 Hypertensive retinopathy, bilateral: Secondary | ICD-10-CM | POA: Diagnosis not present

## 2015-10-12 DIAGNOSIS — I10 Essential (primary) hypertension: Secondary | ICD-10-CM

## 2015-10-12 DIAGNOSIS — E1037X1 Type 1 diabetes mellitus with diabetic macular edema, resolved following treatment, right eye: Secondary | ICD-10-CM | POA: Diagnosis not present

## 2015-10-12 DIAGNOSIS — E10311 Type 1 diabetes mellitus with unspecified diabetic retinopathy with macular edema: Secondary | ICD-10-CM | POA: Diagnosis not present

## 2015-10-12 DIAGNOSIS — H43813 Vitreous degeneration, bilateral: Secondary | ICD-10-CM | POA: Diagnosis not present

## 2015-10-25 ENCOUNTER — Ambulatory Visit (INDEPENDENT_AMBULATORY_CARE_PROVIDER_SITE_OTHER): Payer: Managed Care, Other (non HMO) | Admitting: Podiatry

## 2015-10-25 ENCOUNTER — Encounter: Payer: Self-pay | Admitting: Podiatry

## 2015-10-25 DIAGNOSIS — M79676 Pain in unspecified toe(s): Secondary | ICD-10-CM | POA: Diagnosis not present

## 2015-10-25 DIAGNOSIS — B351 Tinea unguium: Secondary | ICD-10-CM

## 2015-10-25 NOTE — Progress Notes (Signed)
He presents today with a chief complaint of thick yellow dystrophic painful nails.  Objective: Pulses are palpable bilateral. Nails are thick yellow dystrophic discolored with subungual debris. They're painful on palpation as well as debridement.  Assessment: Pain in limb secondary to onychomycosis.  Plan: Debridement of toenails 1 through 5 bilateral as a covered service secondary to pain. Follow-up with him 3 months.

## 2016-01-24 ENCOUNTER — Ambulatory Visit: Payer: Managed Care, Other (non HMO) | Admitting: Podiatry

## 2016-02-02 ENCOUNTER — Ambulatory Visit: Payer: Managed Care, Other (non HMO) | Admitting: Podiatry

## 2016-02-14 ENCOUNTER — Ambulatory Visit (INDEPENDENT_AMBULATORY_CARE_PROVIDER_SITE_OTHER): Payer: Managed Care, Other (non HMO) | Admitting: Podiatry

## 2016-02-14 DIAGNOSIS — B351 Tinea unguium: Secondary | ICD-10-CM

## 2016-02-14 DIAGNOSIS — E1065 Type 1 diabetes mellitus with hyperglycemia: Secondary | ICD-10-CM

## 2016-02-14 DIAGNOSIS — E1040 Type 1 diabetes mellitus with diabetic neuropathy, unspecified: Secondary | ICD-10-CM | POA: Diagnosis not present

## 2016-02-14 DIAGNOSIS — M79676 Pain in unspecified toe(s): Secondary | ICD-10-CM | POA: Diagnosis not present

## 2016-02-14 DIAGNOSIS — IMO0002 Reserved for concepts with insufficient information to code with codable children: Secondary | ICD-10-CM

## 2016-02-14 NOTE — Progress Notes (Signed)
He presents today for his diabetic checkup bilateral foot. He states that his nails are long thick yellow dystrophic and painful.  Objective: Vital signs are stable he is alert and oriented 3 pulses are intact neurologic sensorium is intact. No open lesions or wounds are noted. His toenails are thick yellow dystrophic with mycotic and painful.  Assessment: Diabetes without podiatric complications. Toenails are painful secondary to onychomycosis.  Plan: Debridement of toenails bilateral. Follow up with him in a few months.

## 2016-04-16 ENCOUNTER — Ambulatory Visit (INDEPENDENT_AMBULATORY_CARE_PROVIDER_SITE_OTHER): Payer: Managed Care, Other (non HMO) | Admitting: Ophthalmology

## 2016-04-16 DIAGNOSIS — H43813 Vitreous degeneration, bilateral: Secondary | ICD-10-CM

## 2016-04-16 DIAGNOSIS — E103511 Type 1 diabetes mellitus with proliferative diabetic retinopathy with macular edema, right eye: Secondary | ICD-10-CM | POA: Diagnosis not present

## 2016-04-16 DIAGNOSIS — E10311 Type 1 diabetes mellitus with unspecified diabetic retinopathy with macular edema: Secondary | ICD-10-CM | POA: Diagnosis not present

## 2016-04-16 DIAGNOSIS — E103592 Type 1 diabetes mellitus with proliferative diabetic retinopathy without macular edema, left eye: Secondary | ICD-10-CM

## 2016-04-16 DIAGNOSIS — I10 Essential (primary) hypertension: Secondary | ICD-10-CM

## 2016-04-16 DIAGNOSIS — H35033 Hypertensive retinopathy, bilateral: Secondary | ICD-10-CM

## 2016-04-16 DIAGNOSIS — H2513 Age-related nuclear cataract, bilateral: Secondary | ICD-10-CM

## 2016-05-15 ENCOUNTER — Encounter: Payer: Self-pay | Admitting: Podiatry

## 2016-05-15 ENCOUNTER — Ambulatory Visit (INDEPENDENT_AMBULATORY_CARE_PROVIDER_SITE_OTHER): Payer: Managed Care, Other (non HMO) | Admitting: Podiatry

## 2016-05-15 DIAGNOSIS — M79676 Pain in unspecified toe(s): Secondary | ICD-10-CM

## 2016-05-15 DIAGNOSIS — E1065 Type 1 diabetes mellitus with hyperglycemia: Secondary | ICD-10-CM

## 2016-05-15 DIAGNOSIS — B351 Tinea unguium: Secondary | ICD-10-CM

## 2016-05-15 DIAGNOSIS — IMO0002 Reserved for concepts with insufficient information to code with codable children: Secondary | ICD-10-CM

## 2016-05-15 DIAGNOSIS — E1040 Type 1 diabetes mellitus with diabetic neuropathy, unspecified: Secondary | ICD-10-CM

## 2016-05-16 NOTE — Progress Notes (Signed)
Presents today complaining of painful elongated toenails might have his nails cut.  Objective: Vital signs are stable alert and oriented 3. Pulses are palpable. Toenails are thick yellow dystrophic onychomycotic and painful palpation.  Assessment: Pain and limp secondary to onychomycosis.  Plan: Debridement of nails 1 through 5 bilateral. Follow-up with me as needed

## 2016-08-14 ENCOUNTER — Ambulatory Visit: Payer: Managed Care, Other (non HMO) | Admitting: Podiatry

## 2016-08-16 ENCOUNTER — Encounter: Payer: Self-pay | Admitting: Podiatry

## 2016-08-16 ENCOUNTER — Ambulatory Visit (INDEPENDENT_AMBULATORY_CARE_PROVIDER_SITE_OTHER): Payer: 59 | Admitting: Podiatry

## 2016-08-16 DIAGNOSIS — B351 Tinea unguium: Secondary | ICD-10-CM | POA: Diagnosis not present

## 2016-08-16 DIAGNOSIS — M79676 Pain in unspecified toe(s): Secondary | ICD-10-CM

## 2016-08-16 DIAGNOSIS — E1065 Type 1 diabetes mellitus with hyperglycemia: Secondary | ICD-10-CM

## 2016-08-16 DIAGNOSIS — IMO0002 Reserved for concepts with insufficient information to code with codable children: Secondary | ICD-10-CM

## 2016-08-16 DIAGNOSIS — E1040 Type 1 diabetes mellitus with diabetic neuropathy, unspecified: Secondary | ICD-10-CM

## 2016-08-16 NOTE — Progress Notes (Signed)
He presents today with a history of diabetes mellitus and stage II kidney failure with a chief complaint of painful elongated toenails that need to be trimmed. He states that he is really not having any pain in the rest of his foot.  Objective: Vital signs are stable he is alert and oriented 3 pulses are palpable bilateral. Neurologic sensorium is intact. Degenerative flexors intact. Muscle strength is normal bilateral. Toenails are long thick yellow dystrophic onychomycotic painful palpation as well as debridement.  Assessment: Pain in limb sigmoid onychomycosis.  Plan: Debridement of toenails 1 through 5 bilateral cover service secondary to pain diabetes.

## 2016-10-17 ENCOUNTER — Ambulatory Visit (INDEPENDENT_AMBULATORY_CARE_PROVIDER_SITE_OTHER): Payer: 59 | Admitting: Ophthalmology

## 2016-10-17 DIAGNOSIS — H43813 Vitreous degeneration, bilateral: Secondary | ICD-10-CM | POA: Diagnosis not present

## 2016-10-17 DIAGNOSIS — E113511 Type 2 diabetes mellitus with proliferative diabetic retinopathy with macular edema, right eye: Secondary | ICD-10-CM | POA: Diagnosis not present

## 2016-10-17 DIAGNOSIS — H35033 Hypertensive retinopathy, bilateral: Secondary | ICD-10-CM

## 2016-10-17 DIAGNOSIS — H35371 Puckering of macula, right eye: Secondary | ICD-10-CM

## 2016-10-17 DIAGNOSIS — E113592 Type 2 diabetes mellitus with proliferative diabetic retinopathy without macular edema, left eye: Secondary | ICD-10-CM | POA: Diagnosis not present

## 2016-10-17 DIAGNOSIS — I10 Essential (primary) hypertension: Secondary | ICD-10-CM

## 2016-10-17 DIAGNOSIS — E11311 Type 2 diabetes mellitus with unspecified diabetic retinopathy with macular edema: Secondary | ICD-10-CM | POA: Diagnosis not present

## 2016-11-15 ENCOUNTER — Encounter: Payer: Self-pay | Admitting: Podiatry

## 2016-11-15 ENCOUNTER — Ambulatory Visit (INDEPENDENT_AMBULATORY_CARE_PROVIDER_SITE_OTHER): Payer: 59 | Admitting: Podiatry

## 2016-11-15 DIAGNOSIS — E1065 Type 1 diabetes mellitus with hyperglycemia: Secondary | ICD-10-CM

## 2016-11-15 DIAGNOSIS — B351 Tinea unguium: Secondary | ICD-10-CM | POA: Diagnosis not present

## 2016-11-15 DIAGNOSIS — IMO0002 Reserved for concepts with insufficient information to code with codable children: Secondary | ICD-10-CM

## 2016-11-15 DIAGNOSIS — M79676 Pain in unspecified toe(s): Secondary | ICD-10-CM | POA: Diagnosis not present

## 2016-11-15 DIAGNOSIS — E1040 Type 1 diabetes mellitus with diabetic neuropathy, unspecified: Secondary | ICD-10-CM

## 2016-11-18 NOTE — Progress Notes (Signed)
Presents today chief complaint of painful elongated toenails.  Objective: Toenails are long thick yellow dystrophic onychomycosis painful on palpation as well as debridement. Pulses remain palpable.  Assessment: Pain limb secondary to onychomycosis.  Plan: Debridement toenails.

## 2017-02-19 ENCOUNTER — Encounter: Payer: Self-pay | Admitting: Podiatry

## 2017-02-19 ENCOUNTER — Ambulatory Visit (INDEPENDENT_AMBULATORY_CARE_PROVIDER_SITE_OTHER): Payer: 59 | Admitting: Podiatry

## 2017-02-19 DIAGNOSIS — E1040 Type 1 diabetes mellitus with diabetic neuropathy, unspecified: Secondary | ICD-10-CM | POA: Diagnosis not present

## 2017-02-19 DIAGNOSIS — IMO0002 Reserved for concepts with insufficient information to code with codable children: Secondary | ICD-10-CM

## 2017-02-19 DIAGNOSIS — B351 Tinea unguium: Secondary | ICD-10-CM

## 2017-02-19 DIAGNOSIS — M79676 Pain in unspecified toe(s): Secondary | ICD-10-CM

## 2017-02-19 DIAGNOSIS — E1065 Type 1 diabetes mellitus with hyperglycemia: Secondary | ICD-10-CM

## 2017-02-19 NOTE — Progress Notes (Signed)
He presents today she complained of painful elongated toenails 1 through 5 bilateral.  Objective: Toenails are long thick yellow dystrophic onychomycotic bilateral. Pulses are palpable pain.  Assessment: Pain in limb segment onychomycosis.  Plan: Debridement of toenails 1 through 5 bilateral.

## 2017-04-19 ENCOUNTER — Ambulatory Visit (INDEPENDENT_AMBULATORY_CARE_PROVIDER_SITE_OTHER): Payer: 59 | Admitting: Ophthalmology

## 2017-04-19 DIAGNOSIS — E103511 Type 1 diabetes mellitus with proliferative diabetic retinopathy with macular edema, right eye: Secondary | ICD-10-CM

## 2017-04-19 DIAGNOSIS — I10 Essential (primary) hypertension: Secondary | ICD-10-CM | POA: Diagnosis not present

## 2017-04-19 DIAGNOSIS — H2513 Age-related nuclear cataract, bilateral: Secondary | ICD-10-CM

## 2017-04-19 DIAGNOSIS — E10311 Type 1 diabetes mellitus with unspecified diabetic retinopathy with macular edema: Secondary | ICD-10-CM | POA: Diagnosis not present

## 2017-04-19 DIAGNOSIS — H43813 Vitreous degeneration, bilateral: Secondary | ICD-10-CM | POA: Diagnosis not present

## 2017-04-19 DIAGNOSIS — E103592 Type 1 diabetes mellitus with proliferative diabetic retinopathy without macular edema, left eye: Secondary | ICD-10-CM

## 2017-04-19 DIAGNOSIS — H35371 Puckering of macula, right eye: Secondary | ICD-10-CM | POA: Diagnosis not present

## 2017-04-19 DIAGNOSIS — H35033 Hypertensive retinopathy, bilateral: Secondary | ICD-10-CM

## 2017-05-21 ENCOUNTER — Ambulatory Visit (INDEPENDENT_AMBULATORY_CARE_PROVIDER_SITE_OTHER): Payer: 59 | Admitting: Podiatry

## 2017-05-21 ENCOUNTER — Encounter: Payer: Self-pay | Admitting: Podiatry

## 2017-05-21 DIAGNOSIS — B351 Tinea unguium: Secondary | ICD-10-CM

## 2017-05-21 DIAGNOSIS — M79676 Pain in unspecified toe(s): Secondary | ICD-10-CM | POA: Diagnosis not present

## 2017-05-21 NOTE — Progress Notes (Signed)
He presents today chief complaint of painful elongated toenails.  States that his hemoglobin A1c has increased to 7.2.  He denies any pain or open wounds.  Objective: Toenails are long thick yellow dystrophic onychomycotic tender on palpation as well as debridement.  Assessment: Pain in limb centered onychomycosis history of diabetes mellitus.  Plan: Treatment of toenails 1 through 5 bilateral cover service secondary to pain.

## 2017-08-20 ENCOUNTER — Ambulatory Visit (INDEPENDENT_AMBULATORY_CARE_PROVIDER_SITE_OTHER): Payer: 59 | Admitting: Podiatry

## 2017-08-20 ENCOUNTER — Encounter: Payer: Self-pay | Admitting: Podiatry

## 2017-08-20 DIAGNOSIS — E1065 Type 1 diabetes mellitus with hyperglycemia: Secondary | ICD-10-CM

## 2017-08-20 DIAGNOSIS — B351 Tinea unguium: Secondary | ICD-10-CM | POA: Diagnosis not present

## 2017-08-20 DIAGNOSIS — M79676 Pain in unspecified toe(s): Secondary | ICD-10-CM

## 2017-08-20 DIAGNOSIS — IMO0002 Reserved for concepts with insufficient information to code with codable children: Secondary | ICD-10-CM

## 2017-08-20 DIAGNOSIS — E1040 Type 1 diabetes mellitus with diabetic neuropathy, unspecified: Secondary | ICD-10-CM

## 2017-08-20 NOTE — Progress Notes (Signed)
He presents today chief complaint of painful elongated toenails and diabetes.  Objective: Vital signs are stable he is alert and oriented x3.  Pulses are palpable.  He has a long thick yellow dystrophic-like mycotic.  Assessment: Pain in limb secondary to onychomycosis.  Plan: Regular toenails 1 through 5 bilateral.

## 2017-10-18 ENCOUNTER — Encounter (INDEPENDENT_AMBULATORY_CARE_PROVIDER_SITE_OTHER): Payer: BLUE CROSS/BLUE SHIELD | Admitting: Ophthalmology

## 2017-10-18 DIAGNOSIS — E103511 Type 1 diabetes mellitus with proliferative diabetic retinopathy with macular edema, right eye: Secondary | ICD-10-CM

## 2017-10-18 DIAGNOSIS — E10311 Type 1 diabetes mellitus with unspecified diabetic retinopathy with macular edema: Secondary | ICD-10-CM

## 2017-10-18 DIAGNOSIS — I10 Essential (primary) hypertension: Secondary | ICD-10-CM | POA: Diagnosis not present

## 2017-10-18 DIAGNOSIS — H43813 Vitreous degeneration, bilateral: Secondary | ICD-10-CM | POA: Diagnosis not present

## 2017-10-18 DIAGNOSIS — H35033 Hypertensive retinopathy, bilateral: Secondary | ICD-10-CM

## 2017-10-18 DIAGNOSIS — H35371 Puckering of macula, right eye: Secondary | ICD-10-CM

## 2017-10-18 DIAGNOSIS — E103592 Type 1 diabetes mellitus with proliferative diabetic retinopathy without macular edema, left eye: Secondary | ICD-10-CM | POA: Diagnosis not present

## 2017-11-26 ENCOUNTER — Ambulatory Visit: Payer: 59 | Admitting: Podiatry

## 2017-12-24 ENCOUNTER — Ambulatory Visit (INDEPENDENT_AMBULATORY_CARE_PROVIDER_SITE_OTHER): Payer: BLUE CROSS/BLUE SHIELD | Admitting: Podiatry

## 2017-12-24 ENCOUNTER — Encounter: Payer: Self-pay | Admitting: Podiatry

## 2017-12-24 DIAGNOSIS — E1040 Type 1 diabetes mellitus with diabetic neuropathy, unspecified: Secondary | ICD-10-CM | POA: Diagnosis not present

## 2017-12-24 DIAGNOSIS — E1065 Type 1 diabetes mellitus with hyperglycemia: Secondary | ICD-10-CM | POA: Diagnosis not present

## 2017-12-24 DIAGNOSIS — B351 Tinea unguium: Secondary | ICD-10-CM | POA: Diagnosis not present

## 2017-12-24 DIAGNOSIS — M79676 Pain in unspecified toe(s): Secondary | ICD-10-CM | POA: Diagnosis not present

## 2017-12-24 DIAGNOSIS — IMO0002 Reserved for concepts with insufficient information to code with codable children: Secondary | ICD-10-CM

## 2017-12-25 NOTE — Progress Notes (Signed)
Presents today chief complaint of painful elongated toenails states that on losing more more feeling across the top of my feet diagnosed because the sugars been elevated.  Objective: Vital signs are stable he is alert and oriented x3.  Pulses are palpable.  Toenails are long thick yellow dystrophic onychomycotic there is no breaks in the skin no open lesions or wounds.  Assessment: Pain in limb secondary to onychomycosis.  Diabetic peripheral neuropathy.  Plan: Discussed etiology pathology conservative surgical therapies recommend he help control his diabetes and that should help with the neuropathy from progressing.  Also debrided toenails 1 through 5 bilateral.

## 2018-04-22 ENCOUNTER — Ambulatory Visit (INDEPENDENT_AMBULATORY_CARE_PROVIDER_SITE_OTHER): Payer: BLUE CROSS/BLUE SHIELD | Admitting: Podiatry

## 2018-04-22 ENCOUNTER — Encounter: Payer: Self-pay | Admitting: Podiatry

## 2018-04-22 ENCOUNTER — Encounter (INDEPENDENT_AMBULATORY_CARE_PROVIDER_SITE_OTHER): Payer: BLUE CROSS/BLUE SHIELD | Admitting: Ophthalmology

## 2018-04-22 DIAGNOSIS — H35371 Puckering of macula, right eye: Secondary | ICD-10-CM | POA: Diagnosis not present

## 2018-04-22 DIAGNOSIS — E103592 Type 1 diabetes mellitus with proliferative diabetic retinopathy without macular edema, left eye: Secondary | ICD-10-CM | POA: Diagnosis not present

## 2018-04-22 DIAGNOSIS — B351 Tinea unguium: Secondary | ICD-10-CM | POA: Diagnosis not present

## 2018-04-22 DIAGNOSIS — I10 Essential (primary) hypertension: Secondary | ICD-10-CM

## 2018-04-22 DIAGNOSIS — H43813 Vitreous degeneration, bilateral: Secondary | ICD-10-CM

## 2018-04-22 DIAGNOSIS — E103511 Type 1 diabetes mellitus with proliferative diabetic retinopathy with macular edema, right eye: Secondary | ICD-10-CM | POA: Diagnosis not present

## 2018-04-22 DIAGNOSIS — M79676 Pain in unspecified toe(s): Secondary | ICD-10-CM | POA: Diagnosis not present

## 2018-04-22 DIAGNOSIS — E10311 Type 1 diabetes mellitus with unspecified diabetic retinopathy with macular edema: Secondary | ICD-10-CM

## 2018-04-22 DIAGNOSIS — IMO0002 Reserved for concepts with insufficient information to code with codable children: Secondary | ICD-10-CM

## 2018-04-22 DIAGNOSIS — H2513 Age-related nuclear cataract, bilateral: Secondary | ICD-10-CM

## 2018-04-22 DIAGNOSIS — E1040 Type 1 diabetes mellitus with diabetic neuropathy, unspecified: Secondary | ICD-10-CM

## 2018-04-22 DIAGNOSIS — H35033 Hypertensive retinopathy, bilateral: Secondary | ICD-10-CM

## 2018-04-22 DIAGNOSIS — E1065 Type 1 diabetes mellitus with hyperglycemia: Secondary | ICD-10-CM

## 2018-04-23 NOTE — Progress Notes (Signed)
He presents today chief complaint of painful elongated toenails.  Objective: Toenails are long thick yellow dystrophic onychomycotic pulses are strongly palpable.  No open lesions or wounds.  Assessment: Diabetes type 1 well controlled with neuropathy.  Pain in limb secondary to onychomycosis.  Plan: Debridement of toenails 1 through 5 bilateral.

## 2018-07-02 ENCOUNTER — Ambulatory Visit (INDEPENDENT_AMBULATORY_CARE_PROVIDER_SITE_OTHER): Payer: BLUE CROSS/BLUE SHIELD | Admitting: Podiatry

## 2018-07-02 ENCOUNTER — Encounter: Payer: Self-pay | Admitting: Podiatry

## 2018-07-02 DIAGNOSIS — L601 Onycholysis: Secondary | ICD-10-CM | POA: Diagnosis not present

## 2018-07-02 MED ORDER — GENTAMICIN SULFATE 0.1 % EX CREA: 1.0000 "application " | TOPICAL_CREAM | Freq: Two times a day (BID) | CUTANEOUS | 1 refills | Status: DC

## 2018-07-09 NOTE — Progress Notes (Signed)
   HPI: 43 year old male with PMHx of DM presenting today with a chief complaint of pain to the right third toenail that began yesterday. He reports associated bleeding last night. He is unsure of any known trauma or injury. He believes the nail has become detached from the nail bed. He has not done anything for treatment. Patient is here for further evaluation and treatment.   Past Medical History:  Diagnosis Date  . Diabetes ([place])   . Kidney disease      Physical Exam: General: The patient is alert and oriented x3 in no acute distress.  Dermatology: Partially detached nail plate noted to the right 3rd toe. Skin is warm, dry and supple bilateral lower extremities. Negative for open lesions or macerations.  Vascular: Palpable pedal pulses bilaterally. No edema or erythema noted. Capillary refill within normal limits.  Neurological: Epicritic and protective threshold diminished bilaterally.   Musculoskeletal Exam: Range of motion within normal limits to all pedal and ankle joints bilateral. Muscle strength 5/5 in all groups bilateral.   Assessment: 1. Partially detached nail plate right third toe  2. DM with peripheral neuropathy    Plan of Care:  1. Patient evaluated.  2. Discussed treatment alternatives and plan of care. Explained nail avulsion procedure and post procedure course to patient. 3. Patient opted for total temporary nail avulsion of the right 3rd toe.  4. No anesthesia utilized since patient is neuropathic.   5. Light dressing applied. 6. Prescription for Gentamicin cream provided to patient to use daily with a bandage.  7. Return to clinic as needed.      Edrick Kins, DPM [facility]  Dr. Edrick Kins, DPM    [address], [place] [address]                Office [phone]  Fax [phone]

## 2018-07-24 ENCOUNTER — Ambulatory Visit: Payer: BLUE CROSS/BLUE SHIELD | Admitting: Podiatry

## 2018-07-25 ENCOUNTER — Ambulatory Visit (INDEPENDENT_AMBULATORY_CARE_PROVIDER_SITE_OTHER): Payer: BLUE CROSS/BLUE SHIELD | Admitting: Podiatry

## 2018-07-25 ENCOUNTER — Encounter: Payer: Self-pay | Admitting: Podiatry

## 2018-07-25 DIAGNOSIS — E1065 Type 1 diabetes mellitus with hyperglycemia: Secondary | ICD-10-CM | POA: Diagnosis not present

## 2018-07-25 DIAGNOSIS — E1040 Type 1 diabetes mellitus with diabetic neuropathy, unspecified: Secondary | ICD-10-CM

## 2018-07-25 DIAGNOSIS — M79676 Pain in unspecified toe(s): Secondary | ICD-10-CM

## 2018-07-25 DIAGNOSIS — B351 Tinea unguium: Secondary | ICD-10-CM | POA: Diagnosis not present

## 2018-07-25 DIAGNOSIS — IMO0002 Reserved for concepts with insufficient information to code with codable children: Secondary | ICD-10-CM

## 2018-07-25 NOTE — Progress Notes (Signed)
Complaint:  Visit Type: Patient returns to my office for continued preventative foot care services. Complaint: Patient states" my nails have grown long and thick and become painful to walk and wear shoes" Patient has been diagnosed with DM with no foot complications. The patient presents for preventative foot care services. No changes to ROS  Podiatric Exam: Vascular: dorsalis pedis and posterior tibial pulses are palpable bilateral. Capillary return is immediate. Temperature gradient is WNL. Skin turgor WNL  Sensorium: Diminished  Semmes Weinstein monofilament test. Normal tactile sensation bilaterally. Nail Exam: Pt has thick disfigured discolored nails with subungual debris noted bilateral entire nail hallux through fifth toenails with the exception 3rd toenail right foot.  Ulcer Exam: There is no evidence of ulcer or pre-ulcerative changes or infection. Orthopedic Exam: Muscle tone and strength are WNL. No limitations in general ROM. No crepitus or effusions noted. Foot type and digits show no abnormalities. Bony prominences are unremarkable. Skin: No Porokeratosis. No infection or ulcers  Diagnosis:  Onychomycosis, , Pain in right toe, pain in left toes  Treatment & Plan Procedures and Treatment: Consent by patient was obtained for treatment procedures.   Debridement of mycotic and hypertrophic toenails, 1 through 5 bilateral and clearing of subungual debris. No ulceration, no infection noted.  Return Visit-Office Procedure: Patient instructed to return to the office for a follow up visit 3 months for continued evaluation and treatment.    Gardiner Barefoot DPM

## 2018-08-07 LAB — HM COLONOSCOPY

## 2018-10-21 ENCOUNTER — Encounter (INDEPENDENT_AMBULATORY_CARE_PROVIDER_SITE_OTHER): Payer: BLUE CROSS/BLUE SHIELD | Admitting: Ophthalmology

## 2018-10-21 ENCOUNTER — Other Ambulatory Visit: Payer: Self-pay

## 2018-10-21 DIAGNOSIS — E103511 Type 1 diabetes mellitus with proliferative diabetic retinopathy with macular edema, right eye: Secondary | ICD-10-CM

## 2018-10-21 DIAGNOSIS — E10311 Type 1 diabetes mellitus with unspecified diabetic retinopathy with macular edema: Secondary | ICD-10-CM | POA: Diagnosis not present

## 2018-10-21 DIAGNOSIS — H35371 Puckering of macula, right eye: Secondary | ICD-10-CM

## 2018-10-21 DIAGNOSIS — I10 Essential (primary) hypertension: Secondary | ICD-10-CM | POA: Diagnosis not present

## 2018-10-21 DIAGNOSIS — E103592 Type 1 diabetes mellitus with proliferative diabetic retinopathy without macular edema, left eye: Secondary | ICD-10-CM | POA: Diagnosis not present

## 2018-10-21 DIAGNOSIS — H35033 Hypertensive retinopathy, bilateral: Secondary | ICD-10-CM

## 2018-10-21 DIAGNOSIS — H43813 Vitreous degeneration, bilateral: Secondary | ICD-10-CM

## 2018-10-24 ENCOUNTER — Other Ambulatory Visit: Payer: Self-pay

## 2018-10-24 ENCOUNTER — Encounter: Payer: Self-pay | Admitting: Podiatry

## 2018-10-24 ENCOUNTER — Ambulatory Visit (INDEPENDENT_AMBULATORY_CARE_PROVIDER_SITE_OTHER): Payer: BLUE CROSS/BLUE SHIELD | Admitting: Podiatry

## 2018-10-24 VITALS — Temp 97.7°F

## 2018-10-24 DIAGNOSIS — IMO0002 Reserved for concepts with insufficient information to code with codable children: Secondary | ICD-10-CM

## 2018-10-24 DIAGNOSIS — M79676 Pain in unspecified toe(s): Secondary | ICD-10-CM

## 2018-10-24 DIAGNOSIS — E1040 Type 1 diabetes mellitus with diabetic neuropathy, unspecified: Secondary | ICD-10-CM | POA: Diagnosis not present

## 2018-10-24 DIAGNOSIS — B351 Tinea unguium: Secondary | ICD-10-CM | POA: Diagnosis not present

## 2018-10-24 DIAGNOSIS — E1065 Type 1 diabetes mellitus with hyperglycemia: Secondary | ICD-10-CM

## 2018-10-24 NOTE — Progress Notes (Signed)
Complaint:  Visit Type: Patient returns to my office for continued preventative foot care services. Complaint: Patient states" my nails have grown long and thick and become painful to walk and wear shoes" Patient has been diagnosed with DM with no foot complications. The patient presents for preventative foot care services. No changes to ROS  Podiatric Exam: Vascular: dorsalis pedis and posterior tibial pulses are palpable bilateral. Capillary return is immediate. Temperature gradient is WNL. Skin turgor WNL  Sensorium: Diminished  Semmes Weinstein monofilament test. Normal tactile sensation bilaterally. Nail Exam: Pt has thick disfigured discolored nails with subungual debris noted bilateral entire nail hallux through fifth toenails with the exception 3rd toenail right foot.  Ulcer Exam: There is no evidence of ulcer or pre-ulcerative changes or infection. Orthopedic Exam: Muscle tone and strength are WNL. No limitations in general ROM. No crepitus or effusions noted. Foot type and digits show no abnormalities. Bony prominences are unremarkable. Skin: No Porokeratosis. No infection or ulcers  Diagnosis:  Onychomycosis, , Pain in right toe, pain in left toes  Treatment & Plan Procedures and Treatment: Consent by patient was obtained for treatment procedures.   Debridement of mycotic and hypertrophic toenails, 1 through 5 bilateral and clearing of subungual debris. No ulceration, no infection noted.  Return Visit-Office Procedure: Patient instructed to return to the office for a follow up visit 3 months for continued evaluation and treatment.    Gardiner Barefoot DPM

## 2019-01-23 ENCOUNTER — Ambulatory Visit: Payer: BLUE CROSS/BLUE SHIELD | Admitting: Podiatry

## 2019-02-16 ENCOUNTER — Other Ambulatory Visit: Payer: Self-pay

## 2019-02-16 DIAGNOSIS — Z20822 Contact with and (suspected) exposure to covid-19: Secondary | ICD-10-CM

## 2019-02-18 LAB — NOVEL CORONAVIRUS, NAA: SARS-CoV-2, NAA: NOT DETECTED

## 2019-02-24 ENCOUNTER — Other Ambulatory Visit: Payer: Self-pay | Admitting: Nephrology

## 2019-02-24 DIAGNOSIS — N182 Chronic kidney disease, stage 2 (mild): Secondary | ICD-10-CM

## 2019-02-24 DIAGNOSIS — N179 Acute kidney failure, unspecified: Secondary | ICD-10-CM

## 2019-02-25 ENCOUNTER — Other Ambulatory Visit: Payer: Self-pay

## 2019-02-25 ENCOUNTER — Encounter: Payer: Self-pay | Admitting: Podiatry

## 2019-02-25 ENCOUNTER — Ambulatory Visit (INDEPENDENT_AMBULATORY_CARE_PROVIDER_SITE_OTHER): Payer: BC Managed Care – PPO | Admitting: Podiatry

## 2019-02-25 DIAGNOSIS — M79676 Pain in unspecified toe(s): Secondary | ICD-10-CM

## 2019-02-25 DIAGNOSIS — E1040 Type 1 diabetes mellitus with diabetic neuropathy, unspecified: Secondary | ICD-10-CM

## 2019-02-25 DIAGNOSIS — E1065 Type 1 diabetes mellitus with hyperglycemia: Secondary | ICD-10-CM | POA: Diagnosis not present

## 2019-02-25 DIAGNOSIS — IMO0002 Reserved for concepts with insufficient information to code with codable children: Secondary | ICD-10-CM

## 2019-02-25 DIAGNOSIS — B351 Tinea unguium: Secondary | ICD-10-CM | POA: Diagnosis not present

## 2019-02-25 NOTE — Progress Notes (Signed)
Complaint:  Visit Type: Patient returns to my office for continued preventative foot care services. Complaint: Patient states" my nails have grown long and thick and become painful to walk and wear shoes" Patient has been diagnosed with DM with no foot complications. The patient presents for preventative foot care services. No changes to ROS  Podiatric Exam: Vascular: dorsalis pedis and posterior tibial pulses are palpable bilateral. Capillary return is immediate. Temperature gradient is WNL. Skin turgor WNL  Sensorium: Diminished  Semmes Weinstein monofilament test. Normal tactile sensation bilaterally. Nail Exam: Pt has thick disfigured discolored nails with subungual debris noted bilateral entire nail hallux through fifth toenails with the exception 3rd toenail right foot.  Ulcer Exam: There is no evidence of ulcer or pre-ulcerative changes or infection. Orthopedic Exam: Muscle tone and strength are WNL. No limitations in general ROM. No crepitus or effusions noted. Foot type and digits show no abnormalities. Bony prominences are unremarkable. Skin: No Porokeratosis. No infection or ulcers  Diagnosis:  Onychomycosis, , Pain in right toe, pain in left toes  Treatment & Plan Procedures and Treatment: Consent by patient was obtained for treatment procedures.   Debridement of mycotic and hypertrophic toenails, 1 through 5 bilateral and clearing of subungual debris. No ulceration, no infection noted.  Return Visit-Office Procedure: Patient instructed to return to the office for a follow up visit 3 months for continued evaluation and treatment.    Robbye Dede DPM 

## 2019-02-27 ENCOUNTER — Ambulatory Visit
Admission: RE | Admit: 2019-02-27 | Discharge: 2019-02-27 | Disposition: A | Discharge status: Home / Self Care | Payer: BC Managed Care – PPO | Source: Ambulatory Visit | Attending: Nephrology | Admitting: Nephrology

## 2019-02-27 DIAGNOSIS — N179 Acute kidney failure, unspecified: Secondary | ICD-10-CM

## 2019-02-27 DIAGNOSIS — N182 Chronic kidney disease, stage 2 (mild): Secondary | ICD-10-CM

## 2019-04-23 ENCOUNTER — Encounter (INDEPENDENT_AMBULATORY_CARE_PROVIDER_SITE_OTHER): Payer: BLUE CROSS/BLUE SHIELD | Admitting: Ophthalmology

## 2019-05-26 ENCOUNTER — Ambulatory Visit: Payer: BC Managed Care – PPO | Admitting: Podiatry

## 2019-06-27 ENCOUNTER — Other Ambulatory Visit: Payer: Self-pay | Admitting: Nephrology

## 2019-06-27 ENCOUNTER — Other Ambulatory Visit: Payer: Self-pay | Admitting: Internal Medicine

## 2019-06-27 DIAGNOSIS — M7989 Other specified soft tissue disorders: Secondary | ICD-10-CM

## 2019-06-30 ENCOUNTER — Ambulatory Visit
Admission: RE | Admit: 2019-06-30 | Discharge: 2019-06-30 | Disposition: A | Discharge status: Home / Self Care | Payer: BC Managed Care – PPO | Source: Ambulatory Visit | Attending: Nephrology | Admitting: Nephrology

## 2019-06-30 DIAGNOSIS — M7989 Other specified soft tissue disorders: Secondary | ICD-10-CM

## 2019-07-22 ENCOUNTER — Ambulatory Visit (HOSPITAL_COMMUNITY)
Admission: RE | Admit: 2019-07-22 | Discharge: 2019-07-22 | Disposition: A | Discharge status: Home / Self Care | Payer: BC Managed Care – PPO | Source: Ambulatory Visit | Attending: Vascular Surgery | Admitting: Vascular Surgery

## 2019-07-22 ENCOUNTER — Other Ambulatory Visit: Payer: Self-pay

## 2019-07-22 ENCOUNTER — Ambulatory Visit (INDEPENDENT_AMBULATORY_CARE_PROVIDER_SITE_OTHER): Payer: BC Managed Care – PPO | Admitting: Vascular Surgery

## 2019-07-22 DIAGNOSIS — R600 Localized edema: Secondary | ICD-10-CM | POA: Insufficient documentation

## 2019-07-22 DIAGNOSIS — M7989 Other specified soft tissue disorders: Secondary | ICD-10-CM

## 2019-07-22 NOTE — Progress Notes (Signed)
Referring Physician: Dr. Maudie Mercury  Patient name: Javier Hunt MRN: [id number] DOB: Mar 16, 1976 Sex: male  REASON FOR CONSULT: Right leg swelling  HPI: Javier Hunt is a 44 y.o. male, had fairly acute onset of right leg swelling about 1 month ago.  He states that in the past he has had bilateral leg swelling but this was usually related to diet.  He denies any prior history of DVT.  He has no recent trauma to the right leg.  He has no family history of these type of problems.  He does have chronic kidney disease stage II.  He is followed by Dr. Arty Baumgartner for this.  He does have diabetes.  He has no skin ulceration.    Past Medical History:  Diagnosis Date  . Diabetes ([place])   . Kidney disease    Past Surgical History:  Procedure Laterality Date  . APPENDECTOMY      No family history on file.  SOCIAL HISTORY: Social History   Socioeconomic History  . Marital status: Unknown    Spouse name: Not on file  . Number of children: Not on file  . Years of education: Not on file  . Highest education level: Not on file  Occupational History  . Not on file  Tobacco Use  . Smoking status: Never Smoker  . Smokeless tobacco: Never Used  Substance and Sexual Activity  . Alcohol use: No  . Drug use: Not on file  . Sexual activity: Not on file  Other Topics Concern  . Not on file  Social History Narrative  . Not on file   Social Determinants of Health   Financial Resource Strain:   . Difficulty of Paying Living Expenses: Not on file  Food Insecurity:   . Worried About Charity fundraiser in the Last Year: Not on file  . Ran Out of Food in the Last Year: Not on file  Transportation Needs:   . Lack of Transportation (Medical): Not on file  . Lack of Transportation (Non-Medical): Not on file  Physical Activity:   . Days of Exercise per Week: Not on file  . Minutes of Exercise per Session: Not on file  Stress:   . Feeling of Stress : Not on file  Social Connections:   .  Frequency of Communication with Friends and Family: Not on file  . Frequency of Social Gatherings with Friends and Family: Not on file  . Attends Religious Services: Not on file  . Active Member of Clubs or Organizations: Not on file  . Attends Archivist Meetings: Not on file  . Marital Status: Not on file  Intimate Partner Violence:   . Fear of Current or Ex-Partner: Not on file  . Emotionally Abused: Not on file  . Physically Abused: Not on file  . Sexually Abused: Not on file    No Known Allergies  Current Outpatient Medications  Medication Sig Dispense Refill  . amLODipine (NORVASC) 5 MG tablet Take 5 mg by mouth daily.    Marland Kitchen aspirin 81 MG tablet Take 81 mg by mouth daily.    Marland Kitchen atorvastatin (LIPITOR) 40 MG tablet     . Ciprofloxacin HCl 0.2 % otic solution PUT 0.25 ML (ONE VIAL) INTO BOTH EARS EVERY 12 HOURS FOR 7 DAYS    . CONTOUR NEXT TEST test strip USE 8 TIMES DAILY AND AS DIRECTED  12  . gentamicin cream (GARAMYCIN) 0.1 % Apply 1 application topically 2 (two) times daily. 30 g  1  . insulin aspart (NOVOLOG) 100 UNIT/ML injection Inject into the skin 3 (three) times daily before meals.    . metoprolol succinate (TOPROL-XL) 50 MG 24 hr tablet Take 50 mg by mouth daily. Take with or immediately following a meal.    . olmesartan (BENICAR) 20 MG tablet Take 20 mg by mouth daily.  3  . omeprazole (PRILOSEC) 40 MG capsule Take 40 mg by mouth daily.  3  . tadalafil (CIALIS) 20 MG tablet Take 20 mg by mouth daily.  8  . Vitamin D, Ergocalciferol, (DRISDOL) 50000 UNITS CAPS capsule Take 50,000 Units by mouth every 7 (seven) days.     No current facility-administered medications for this visit.    ROS:   General:  No weight loss, Fever, chills  HEENT: No recent headaches, no nasal bleeding, no visual changes, no sore throat  Neurologic: No dizziness, blackouts, seizures. No recent symptoms of stroke or mini- stroke. No recent episodes of slurred speech, or temporary  blindness.  Cardiac: No recent episodes of chest pain/pressure, no shortness of breath at rest.  No shortness of breath with exertion.  Denies history of atrial fibrillation or irregular heartbeat  Vascular: No history of rest pain in feet.  No history of claudication.  No history of non-healing ulcer, No history of DVT   Pulmonary: No home oxygen, no productive cough, no hemoptysis,  No asthma or wheezing  Musculoskeletal:  [ ]  Arthritis, [ ]  Low back pain,  [ ]  Joint pain  Hematologic:No history of hypercoagulable state.  No history of easy bleeding.  No history of anemia  Gastrointestinal: No hematochezia or melena,  No gastroesophageal reflux, no trouble swallowing  Urinary: [X]  chronic Kidney disease, [ ]  on HD - [ ]  MWF or [ ]  TTHS, [ ]  Burning with urination, [ ]  Frequent urination, [ ]  Difficulty urinating;   Skin: No rashes  Psychological: No history of anxiety,  No history of depression   Physical Examination  There were no vitals filed for this visit.  General:  Alert and oriented, no acute distress HEENT: Normal Neck: No JVD Cardiac: Regular Rate and Rhythm without murmur Skin: No rash, no erythema skin temperature in the medial calf on the right leg is warmer compared to the left Extremity Pulses:  2+ radial, brachial, femoral, absent dorsalis pedis, posterior tibial pulses bilaterally Musculoskeletal: No deformity right lateral calf scar about 4 cm in diameter edema right lower extremity approximately 5 to 10% larger than the left lower extremity this is nonpitting edema the foot is not involved he does not really have any swelling above the knee No obvious mass in the groin or throughout the right leg  Neurologic: Upper and lower extremity motor 5/5 and symmetric  DATA:  Patient had a venous reflux exam performed today.  This showed no evidence of DVT.  There was an enlarged lymph node in the right groin  ASSESSMENT: Right leg swelling no obvious venous etiology  for this.  However he does have some warmth over the medial aspect of his right leg.  He does not really have any open wounds or erythema suggestive of an obvious cellulitis but certainly he could be at risk for this with his diabetes.  Patient did not have any palpable pulses in his leg most likely secondary to calcification from his diabetes but we also need to rule out arterial occlusive disease.   PLAN: She was given a prescription today for lower extremity compression stockings.  He will  continue to wear these for the next 3 months.  He will see me back in follow-up with arterial duplex and ABIs at that time.  If he has progressive increased pain redness swelling he may need further treatment for cellulitis in his right lower extremity.   Ruta Hinds, MD [facility] Office: [phone] Pager: [phone]

## 2019-07-24 ENCOUNTER — Other Ambulatory Visit: Payer: Self-pay | Admitting: *Deleted

## 2019-07-24 DIAGNOSIS — R600 Localized edema: Secondary | ICD-10-CM

## 2019-08-11 ENCOUNTER — Observation Stay (HOSPITAL_COMMUNITY): Payer: BC Managed Care – PPO

## 2019-08-11 ENCOUNTER — Observation Stay (HOSPITAL_COMMUNITY)
Admission: EM | Admit: 2019-08-11 | Discharge: 2019-08-12 | Disposition: A | Discharge status: Home / Self Care | Payer: BC Managed Care – PPO | Attending: Internal Medicine | Admitting: Internal Medicine

## 2019-08-11 ENCOUNTER — Other Ambulatory Visit: Payer: Self-pay

## 2019-08-11 ENCOUNTER — Other Ambulatory Visit: Payer: Self-pay | Admitting: Internal Medicine

## 2019-08-11 ENCOUNTER — Encounter (HOSPITAL_COMMUNITY): Payer: Self-pay

## 2019-08-11 DIAGNOSIS — Z20822 Contact with and (suspected) exposure to covid-19: Secondary | ICD-10-CM | POA: Insufficient documentation

## 2019-08-11 DIAGNOSIS — Z7982 Long term (current) use of aspirin: Secondary | ICD-10-CM | POA: Diagnosis not present

## 2019-08-11 DIAGNOSIS — E669 Obesity, unspecified: Secondary | ICD-10-CM | POA: Diagnosis not present

## 2019-08-11 DIAGNOSIS — N1831 Chronic kidney disease, stage 3a: Secondary | ICD-10-CM | POA: Insufficient documentation

## 2019-08-11 DIAGNOSIS — D649 Anemia, unspecified: Secondary | ICD-10-CM

## 2019-08-11 DIAGNOSIS — Z888 Allergy status to other drugs, medicaments and biological substances status: Secondary | ICD-10-CM | POA: Diagnosis not present

## 2019-08-11 DIAGNOSIS — R0602 Shortness of breath: Secondary | ICD-10-CM

## 2019-08-11 DIAGNOSIS — Z79899 Other long term (current) drug therapy: Secondary | ICD-10-CM | POA: Diagnosis not present

## 2019-08-11 DIAGNOSIS — D509 Iron deficiency anemia, unspecified: Secondary | ICD-10-CM | POA: Diagnosis not present

## 2019-08-11 DIAGNOSIS — Z881 Allergy status to other antibiotic agents status: Secondary | ICD-10-CM | POA: Insufficient documentation

## 2019-08-11 DIAGNOSIS — I130 Hypertensive heart and chronic kidney disease with heart failure and stage 1 through stage 4 chronic kidney disease, or unspecified chronic kidney disease: Secondary | ICD-10-CM | POA: Diagnosis not present

## 2019-08-11 DIAGNOSIS — I5031 Acute diastolic (congestive) heart failure: Secondary | ICD-10-CM | POA: Diagnosis not present

## 2019-08-11 DIAGNOSIS — E1122 Type 2 diabetes mellitus with diabetic chronic kidney disease: Secondary | ICD-10-CM | POA: Insufficient documentation

## 2019-08-11 DIAGNOSIS — R600 Localized edema: Secondary | ICD-10-CM | POA: Diagnosis not present

## 2019-08-11 DIAGNOSIS — Z9641 Presence of insulin pump (external) (internal): Secondary | ICD-10-CM | POA: Diagnosis not present

## 2019-08-11 DIAGNOSIS — R609 Edema, unspecified: Secondary | ICD-10-CM

## 2019-08-11 LAB — CBC
HCT: 24.5 % — ABNORMAL LOW (ref 39.0–52.0)
Hemoglobin: 7.3 g/dL — ABNORMAL LOW (ref 13.0–17.0)
MCH: 24.8 pg — ABNORMAL LOW (ref 26.0–34.0)
MCHC: 29.8 g/dL — ABNORMAL LOW (ref 30.0–36.0)
MCV: 83.3 fL (ref 80.0–100.0)
Platelets: 470 10*3/uL — ABNORMAL HIGH (ref 150–400)
RBC: 2.94 MIL/uL — ABNORMAL LOW (ref 4.22–5.81)
RDW: 12.8 % (ref 11.5–15.5)
WBC: 9.2 10*3/uL (ref 4.0–10.5)
nRBC: 0 % (ref 0.0–0.2)

## 2019-08-11 LAB — DIFFERENTIAL
Abs Immature Granulocytes: 0.03 10*3/uL (ref 0.00–0.07)
Basophils Absolute: 0 10*3/uL (ref 0.0–0.1)
Basophils Relative: 0 %
Eosinophils Absolute: 0.4 10*3/uL (ref 0.0–0.5)
Eosinophils Relative: 4 %
Immature Granulocytes: 0 %
Lymphocytes Relative: 18 %
Lymphs Abs: 1.7 10*3/uL (ref 0.7–4.0)
Monocytes Absolute: 1.1 10*3/uL — ABNORMAL HIGH (ref 0.1–1.0)
Monocytes Relative: 12 %
Neutro Abs: 6 10*3/uL (ref 1.7–7.7)
Neutrophils Relative %: 66 %

## 2019-08-11 LAB — SODIUM, URINE, RANDOM: Sodium, Ur: 63 mmol/L

## 2019-08-11 LAB — URINALYSIS, ROUTINE W REFLEX MICROSCOPIC
Bilirubin Urine: NEGATIVE
Glucose, UA: 150 mg/dL — AB
Ketones, ur: NEGATIVE mg/dL
Leukocytes,Ua: NEGATIVE
Nitrite: NEGATIVE
Protein, ur: 100 mg/dL — AB
Specific Gravity, Urine: 1.008 (ref 1.005–1.030)
pH: 5 (ref 5.0–8.0)

## 2019-08-11 LAB — COMPREHENSIVE METABOLIC PANEL
ALT: 12 U/L (ref 0–44)
AST: 15 U/L (ref 15–41)
Albumin: 2.6 g/dL — ABNORMAL LOW (ref 3.5–5.0)
Alkaline Phosphatase: 90 U/L (ref 38–126)
Anion gap: 8 (ref 5–15)
BUN: 38 mg/dL — ABNORMAL HIGH (ref 6–20)
CO2: 25 mmol/L (ref 22–32)
Calcium: 8.7 mg/dL — ABNORMAL LOW (ref 8.9–10.3)
Chloride: 103 mmol/L (ref 98–111)
Creatinine, Ser: 1.98 mg/dL — ABNORMAL HIGH (ref 0.61–1.24)
GFR calc Af Amer: 46 mL/min — ABNORMAL LOW (ref >60)
GFR calc non Af Amer: 40 mL/min — ABNORMAL LOW (ref >60)
Glucose, Bld: 203 mg/dL — ABNORMAL HIGH (ref 70–99)
Potassium: 4.5 mmol/L (ref 3.5–5.1)
Sodium: 136 mmol/L (ref 135–145)
Total Bilirubin: 0.4 mg/dL (ref 0.3–1.2)
Total Protein: 7.1 g/dL (ref 6.5–8.1)

## 2019-08-11 LAB — IRON AND TIBC
Iron: 20 ug/dL — ABNORMAL LOW (ref 45–182)
Saturation Ratios: 10 % — ABNORMAL LOW (ref 17.9–39.5)
TIBC: 210 ug/dL — ABNORMAL LOW (ref 250–450)
UIBC: 190 ug/dL

## 2019-08-11 LAB — RETICULOCYTES
Immature Retic Fract: 9.9 % (ref 2.3–15.9)
RBC.: 2.87 MIL/uL — ABNORMAL LOW (ref 4.22–5.81)
Retic Count, Absolute: 70.9 10*3/uL (ref 19.0–186.0)
Retic Ct Pct: 2.5 % (ref 0.4–3.1)

## 2019-08-11 LAB — VITAMIN B12: Vitamin B-12: 710 pg/mL (ref 180–914)

## 2019-08-11 LAB — PROTEIN / CREATININE RATIO, URINE
Creatinine, Urine: 52.53 mg/dL
Protein Creatinine Ratio: 1.92 mg/mg{Cre} — ABNORMAL HIGH (ref 0.00–0.15)
Total Protein, Urine: 101 mg/dL

## 2019-08-11 LAB — FOLATE: Folate: 12.6 ng/mL (ref >5.9)

## 2019-08-11 LAB — PREPARE RBC (CROSSMATCH)

## 2019-08-11 LAB — FERRITIN: Ferritin: 411 ng/mL — ABNORMAL HIGH (ref 24–336)

## 2019-08-11 LAB — POC OCCULT BLOOD, ED: Fecal Occult Bld: NEGATIVE

## 2019-08-11 MED ORDER — SODIUM CHLORIDE 0.9 % IV SOLN: 10.0000 mL/h | Freq: Once | INTRAVENOUS | Status: DC

## 2019-08-11 NOTE — ED Notes (Signed)
Paper consent at bedside.

## 2019-08-11 NOTE — ED Provider Notes (Signed)
[facility] DEPT Provider Note   CSN: [id number] Arrival date & time: 08/11/19  1559     History Chief Complaint  Patient presents with  . Abnormal Lab  . Leg Swelling    Javier Hunt is a 44 y.o. male with a past medical history of CKD, HTN, HLD presenting to the ED from PCPs office for low hemoglobin.  Went to his PCP this morning due to ongoing concerns of his right lower extremity edema.  States that the edema has been present for the past 2 months.  He had a negative ultrasound to rule out DVT 3 weeks ago.  He was told to wear compression stockings and placed on a diuretic.  States that his symptoms have not improved.  He had another ultrasound done today but does not know the results of this.  He does report some shortness of breath and fatigue over the past few days.  Has not noticed any dark or tarry stools.  Has not needed a blood transfusion in the past.  Denies any abdominal pain, chest pain, history of DVT or PE, injuries or falls.  He has been compliant with his home medications.  He was also placed on an antibiotic by his PCP today for concerns for cellulitis of the right lower extremity. Patient has had 2 colonoscopies in the past 4 years that showed polyps which were removed but no other abnormalities.  HPI     Past Medical History:  Diagnosis Date  . Diabetes ([place])   . Kidney disease     There are no problems to display for this patient.   Past Surgical History:  Procedure Laterality Date  . APPENDECTOMY         Family History  Problem Relation Age of Onset  . Heart failure Mother   . Stroke Mother   . Cancer Mother   . Cancer Father   . Heart failure Father     Social History   Tobacco Use  . Smoking status: Never Smoker  . Smokeless tobacco: Never Used  Substance Use Topics  . Alcohol use: No  . Drug use: Never    Home Medications Prior to Admission medications   Medication Sig Start Date End Date Taking?  Authorizing Provider  amLODipine (NORVASC) 5 MG tablet Take 5 mg by mouth daily.    [provider]  aspirin 81 MG tablet Take 81 mg by mouth daily.    [provider]  atorvastatin (LIPITOR) 40 MG tablet  08/11/17   [provider]  Ciprofloxacin HCl 0.2 % otic solution PUT 0.25 ML (ONE VIAL) INTO BOTH EARS EVERY 12 HOURS FOR 7 DAYS 11/25/18   [provider]  CONTOUR NEXT TEST test strip USE 8 TIMES DAILY AND AS DIRECTED 05/16/17   [provider]  gentamicin cream (GARAMYCIN) 0.1 % Apply 1 application topically 2 (two) times daily. 07/02/18   Edrick Kins, DPM  insulin aspart (NOVOLOG) 100 UNIT/ML injection Inject into the skin 3 (three) times daily before meals.    [provider]  metoprolol succinate (TOPROL-XL) 50 MG 24 hr tablet Take 50 mg by mouth daily. Take with or immediately following a meal.    [provider]  olmesartan (BENICAR) 20 MG tablet Take 20 mg by mouth daily. 06/22/17   [provider]  omeprazole (PRILOSEC) 40 MG capsule Take 40 mg by mouth daily. 06/22/17   [provider]  tadalafil (CIALIS) 20 MG tablet Take 20 mg  by mouth daily. 05/15/17   [provider]  Vitamin D, Ergocalciferol, (DRISDOL) 50000 UNITS CAPS capsule Take 50,000 Units by mouth every 7 (seven) days.    [provider]    Allergies    Keflex [cephalexin] and Lisinopril  Review of Systems   Review of Systems  Constitutional: Negative for appetite change, chills and fever.  HENT: Negative for ear pain, rhinorrhea, sneezing and sore throat.   Eyes: Negative for photophobia and visual disturbance.  Respiratory: Positive for shortness of breath. Negative for cough, chest tightness and wheezing.   Cardiovascular: Positive for leg swelling. Negative for chest pain and palpitations.  Gastrointestinal: Negative for abdominal pain, blood in stool, constipation, diarrhea, nausea and vomiting.  Genitourinary:  Negative for dysuria, hematuria and urgency.  Musculoskeletal: Negative for myalgias.  Skin: Positive for color change. Negative for rash.  Neurological: Negative for dizziness, weakness and light-headedness.    Physical Exam Updated Vital Signs BP (!) 150/71   Pulse 87   Temp 98.2 F (36.8 C) (Oral)   Resp 16   Ht 5\' 6"  (1.676 m)   Wt 90.3 kg   SpO2 98%   BMI 32.12 kg/m   Physical Exam Vitals and nursing note reviewed. Exam conducted with a chaperone present.  Constitutional:      General: He is not in acute distress.    Appearance: He is well-developed.  HENT:     Head: Normocephalic and atraumatic.     Nose: Nose normal.  Eyes:     General: No scleral icterus.       Left eye: No discharge.     Conjunctiva/sclera: Conjunctivae normal.  Cardiovascular:     Rate and Rhythm: Normal rate and regular rhythm.     Heart sounds: Normal heart sounds. No murmur. No friction rub. No gallop.   Pulmonary:     Effort: Pulmonary effort is normal. No respiratory distress.     Breath sounds: Normal breath sounds.  Abdominal:     General: Bowel sounds are normal. There is no distension.     Palpations: Abdomen is soft.     Tenderness: There is no abdominal tenderness. There is no guarding.  Genitourinary:    Comments: Brown stool noted on exam. No external abnormalities or tenderness. RN served as Producer, television/film/video for exam. Musculoskeletal:        General: Normal range of motion.     Cervical back: Normal range of motion and neck supple.     Right lower leg: Edema present.     Comments: RLE edema and erythema noted. 2+ DP pulses palpated bilaterally. No specific calf tenderness. Sensation intact to light touch of BLE.  Skin:    General: Skin is warm and dry.     Findings: No rash.  Neurological:     Mental Status: He is alert.     Motor: No abnormal muscle tone.     Coordination: Coordination normal.     ED Results / Procedures / Treatments   Labs (all labs ordered are listed,  but only abnormal results are displayed) Labs Reviewed  COMPREHENSIVE METABOLIC PANEL - Abnormal; Notable for the following components:      Result Value   Glucose, Bld 203 (*)    BUN 38 (*)    Creatinine, Ser 1.98 (*)    Calcium 8.7 (*)    Albumin 2.6 (*)    GFR calc non Af Amer 40 (*)    GFR calc Af Amer 46 (*)    All  other components within normal limits  CBC - Abnormal; Notable for the following components:   RBC 2.94 (*)    Hemoglobin 7.3 (*)    HCT 24.5 (*)    MCH 24.8 (*)    MCHC 29.8 (*)    Platelets 470 (*)    All other components within normal limits  RETICULOCYTES - Abnormal; Notable for the following components:   RBC. 2.87 (*)    All other components within normal limits  DIFFERENTIAL - Abnormal; Notable for the following components:   Monocytes Absolute 1.1 (*)    All other components within normal limits  SARS CORONAVIRUS 2 (TAT 6-24 HRS)  VITAMIN B12  FOLATE  IRON AND TIBC  FERRITIN  POC OCCULT BLOOD, ED  TYPE AND SCREEN  PREPARE RBC (CROSSMATCH)    EKG None  Radiology No results found.  Procedures .Critical Care Performed by: Delia Heady, PA-C Authorized by: Delia Heady, PA-C   Critical care provider statement:    Critical care time (minutes):  35   Critical care was necessary to treat or prevent imminent or life-threatening deterioration of the following conditions:  Circulatory failure, CNS failure or compromise, respiratory failure, renal failure and cardiac failure   Critical care was time spent personally by me on the following activities:  Development of treatment plan with patient or surrogate, discussions with consultants, evaluation of patient's response to treatment, examination of patient, obtaining history from patient or surrogate, ordering and performing treatments and interventions, ordering and review of laboratory studies, ordering and review of radiographic studies, pulse oximetry, re-evaluation of patient's condition and review  of old charts   I assumed direction of critical care for this patient from another provider in my specialty: no     (including critical care time)  Medications Ordered in ED Medications  0.9 %  sodium chloride infusion (has no administration in time range)    ED Course  I have reviewed the triage vital signs and the nursing notes.  Pertinent labs & imaging results that were available during my care of the patient were reviewed by me and considered in my medical decision making (see chart for details).  Clinical Course as of Aug 11 1847  Tue Aug 11, 2019  1710 Right lower extremity ultrasound done today: IMPRESSION:   1. No evidence of deep venous thrombosis.  2. Right lower extremity edema and mildly enlarged right groin lymph nodes.    [HK]  1723 Hemoglobin(!): 7.3 [HK]  1804 Creatinine(!): 1.98 [HK]    Clinical Course User Index [HK] Delia Heady, PA-C   MDM Rules/Calculators/A&P                      44 year old male with a past medical history of CKD, hypertension, hyperlipidemia presenting to the ED from PCPs office. He has been having right lower extremity edema for the past 2 months. He has a negative ultrasound 3 weeks ago and then another negative ultrasound for DVT today. PCP collected blood work which showed hemoglobin of 6.9. He was also prescribed antibiotics for a possible RLE infection. He was sent to the ER for further evaluation and treatment of this. He denies history of anemia in the past and no history of prior GI bleeds. Has not noticed any dark or tarry stools. On exam there is edema noted of the RLE with erythema. Area is neurovascularly intact. No specific calf tenderness. I reviewed lab work that was sent from [facility] office. Hemoccult is negative here. His  hemoglobin here in 7.3. As patient is symptomatic from his anemia and unsure of cause, feel he will benefit from admission for transfusion and further workup. I have ordered an anemia panel and COVID  test. Hospitalist to admit.  Final Clinical Impression(s) / ED Diagnoses Final diagnoses:  Symptomatic anemia  Peripheral edema    Rx / DC Orders ED Discharge Orders    None      Portions of this note were generated with Dragon dictation software. Dictation errors may occur despite best attempts at proofreading.    Delia Heady, PA-C 08/11/19 [place], [place], DO 08/11/19 1902

## 2019-08-11 NOTE — ED Triage Notes (Signed)
Patient states he was called by his PCP office today and was told he had a low Hgb.  Patient also c/o right leg swelling mid January. Patient states he has had an [place] 3 weeks ago and was negative for blood clot. Patient states he had another US done today, but has not been notified of the results.

## 2019-08-11 NOTE — ED Notes (Signed)
Per PCP-sending to ED for low Hgb 6.0-abnormal creatine-possible infection

## 2019-08-11 NOTE — H&P (Signed)
History and Physical  Javier Hunt MGQ:[id number] DOB: 10/31/1975 DOA: 08/11/2019  Referring physician: ER provider PCP: [name], MD  Outpatient Specialists: Nephrology  Patient coming from: PCPs office  Chief Complaint: Low hemoglobin  HPI:  Patient is a 44 year old African-American male with past medical history significant for diabetes mellitus, chronic kidney disease with baseline serum creatinine of 1.8, likely previously undiagnosed hypertension, obesity and significant right lower extremity edema (patient also has mild edema involving left lower extremity).  Apparently, patient has had right lower extremity edema for over 2 months.  Patient has had negative venous Doppler ultrasound of the right lower extremity on 2 occasions at least.  Patient went to see the primary care provider for follow-up of the right lower extremity edema and was told that his hemoglobin was low.  On further questioning, patient endorsed shortness of breath, dyspnea on exertion and fatigue.  Patient reports feeling cold with associated night sweats, but no fever.  On presentation to the hospital, patient was found to have hemoglobin of 7.3 g/dL, serum creatinine of 1.98 (baseline is 1.8), negative fecal occult blood, MCV of 83.3, 20, TIBC of 210, ferritin of 411 and folate of 12.6.  No headache, no neck pain, no chest pain, no URI symptoms, no GI symptoms and no urinary symptoms.  Patient be admitted for further assessment and management.  ED Course: ER provider is transfusing 1 unit of packed red blood cells.  Pertinent labs: As documented above.  Imaging: independently reviewed.   Review of Systems:  Negative for fever, visual changes, sore throat, rash, new muscle aches, chest pain, dysuria, bleeding, n/v/abdominal pain.  Past Medical History:  Diagnosis Date  . Diabetes ([place])   . Kidney disease     Past Surgical History:  Procedure Laterality Date  . APPENDECTOMY       reports that he has never  smoked. He has never used smokeless tobacco. He reports that he does not drink alcohol or use drugs.  Allergies  Allergen Reactions  . Lisinopril Cough  . Keflex [Cephalexin] Rash    Family History  Problem Relation Age of Onset  . Heart failure Mother   . Stroke Mother   . Cancer Mother   . Cancer Father   . Heart failure Father      Prior to Admission medications   Medication Sig Start Date End Date Taking? Authorizing Provider  amLODipine (NORVASC) 5 MG tablet Take 5 mg by mouth daily.   Yes [provider]  aspirin 81 MG tablet Take 81 mg by mouth daily.   Yes [provider]  atorvastatin (LIPITOR) 40 MG tablet Take 40 mg by mouth daily at 6 PM.  08/11/17  Yes [provider]  ezetimibe (ZETIA) 10 MG tablet Take 10 mg by mouth daily. 07/14/19  Yes [provider]  Insulin Human (INSULIN PUMP) SOLN Inject into the skin. Novolog. Base: 25.45 units per day.  1 unit per 10 grams of carbohydrates.   Yes [provider]  metoprolol succinate (TOPROL-XL) 50 MG 24 hr tablet Take 50 mg by mouth daily. Take with or immediately following a meal.   Yes [provider]  olmesartan (BENICAR) 5 MG tablet Take 10 mg by mouth daily. 07/14/19  Yes [provider]  omeprazole (PRILOSEC) 40 MG capsule Take 40 mg by mouth daily. 06/22/17  Yes [provider]  tadalafil (CIALIS) 20 MG tablet Take 20 mg by mouth daily as needed for erectile dysfunction.  05/15/17  Yes [provider]  Vitamin D, Ergocalciferol, (DRISDOL) 50000 UNITS CAPS capsule Take 50,000 Units by mouth 2 (two) times a week. Monday and Wednesday   Yes [provider]  CONTOUR NEXT TEST test strip USE 8 TIMES DAILY AND AS DIRECTED 05/16/17   [provider]  gentamicin cream (GARAMYCIN) 0.1 % Apply 1 application topically 2 (two) times daily. Patient not taking: Reported on 08/11/2019 07/02/18   Edrick Kins, DPM    Physical Exam: Vitals:    08/11/19 1610 08/11/19 1619 08/11/19 1731 08/11/19 1852  BP: 140/68  (!) 150/71 (!) 156/64  Pulse: 85  87 91  Resp: 16  16 20   Temp: 98.2 F (36.8 C)     TempSrc: Oral     SpO2: 97%  98% 100%  Weight:  90.3 kg    Height:  5\' 6"  (1.676 m)      Constitutional:  . Appears calm and comfortable.  Patient has freckles. Eyes:  . Pallor. No jaundice.  ENMT:  . external ears, nose appear normal Neck:  . Neck is supple. No JVD Respiratory:  . CTA bilaterally, no w/r/r.  . Respiratory effort normal. No retractions or accessory muscle use Cardiovascular:  . S1S2 . 2+ to 3 right lower extremity edema, left lower extremity edema is under 1.   Abdomen:  . Abdomen is obese, soft and non tender. Organs are difficult to assess. Neurologic:  . Awake and alert. . Moves all limbs.  Wt Readings from Last 3 Encounters:  08/11/19 90.3 kg  06/16/13 88 kg  04/14/13 89.8 kg    I have personally reviewed following labs and imaging studies  Labs on Admission:  CBC: Recent Labs  Lab 08/11/19 1642  WBC 9.2  NEUTROABS 6.0  HGB 7.3*  HCT 24.5*  MCV 83.3  PLT 409*   Basic Metabolic Panel: Recent Labs  Lab 08/11/19 1642  NA 136  K 4.5  CL 103  CO2 25  GLUCOSE 203*  BUN 38*  CREATININE 1.98*  CALCIUM 8.7*   Liver Function Tests: Recent Labs  Lab 08/11/19 1642  AST 15  ALT 12  ALKPHOS 90  BILITOT 0.4  PROT 7.1  ALBUMIN 2.6*   No results for input(s): LIPASE, AMYLASE in the last 168 hours. No results for input(s): AMMONIA in the last 168 hours. Coagulation Profile: No results for input(s): INR, PROTIME in the last 168 hours. Cardiac Enzymes: No results for input(s): CKTOTAL, CKMB, CKMBINDEX, TROPONINI in the last 168 hours. BNP (last 3 results) No results for input(s): PROBNP in the last 8760 hours. HbA1C: No results for input(s): HGBA1C in the last 72 hours. CBG: No results for input(s): GLUCAP in the last 168 hours. Lipid Profile: No results for input(s):  CHOL, HDL, LDLCALC, TRIG, CHOLHDL, LDLDIRECT in the last 72 hours. Thyroid Function Tests: No results for input(s): TSH, T4TOTAL, FREET4, T3FREE, THYROIDAB in the last 72 hours. Anemia Panel: Recent Labs    08/11/19 1810  VITAMINB12 710  FOLATE 12.6  FERRITIN 411*  TIBC 210*  IRON 20*  RETICCTPCT 2.5   Urine analysis: No results found for: COLORURINE, APPEARANCEUR, LABSPEC, PHURINE, GLUCOSEU, HGBUR, BILIRUBINUR, KETONESUR, PROTEINUR, UROBILINOGEN, NITRITE, LEUKOCYTESUR Sepsis Labs: @LABRCNTIP (procalcitonin:4,lacticidven:4) )No results found for this or any previous visit (from the past 240 hour(s)).    Radiological Exams on Admission: No results found.   Active Problems:   Symptomatic anemia   Assessment/Plan Symptomatic anemia: -Admit patient -Monitor H/H -Transfuse patient with packed red blood cells. -Etiology is likely multifactorial (  suspect combined anemia of chronic inflammation and chronic kidney disease.  Cannot totally rule out iron deficiency) -Patient has chronic kidney disease -Ferritin is greater than 400, with low TIBC and low iron.   -Fecal occult blood is negative -Low threshold to consult GI and nephrology team.  Chronic kidney disease stage IIIa: -Optimize blood sugar control -Judicious use of ARB or ACE inhibitor  -Patient follows up with nephrology team.  Diabetes mellitus with likely proteinuria: -Check UPC -ARB or ACE inhibitor -Monitor renal function closely -Optimize blood sugar control  Likely hypertension: -Continue current medication -Ensure blood pressure is optimized.  Mild obesity: -Diet and exercise -Further management on outpatient basis.  Bilateral lower extremity edema, significantly worse on the right: -Venous Doppler ultrasound has been negative on couple of occasions -Proceed with CT scan of the abdomen without contrast -Further management will depend on above.   DVT prophylaxis: SCD until active bleeding is ruled  out Code Status: Full code Family Communication:  Disposition Plan: Home eventually Consults called: None Admission status: Observation  Time spent: 65 minutes.   Dana Allan, MD  [facility] Pager #: [phone] 7PM-7AM contact night coverage as above  08/11/2019, 8:15 PM

## 2019-08-12 ENCOUNTER — Observation Stay (HOSPITAL_BASED_OUTPATIENT_CLINIC_OR_DEPARTMENT_OTHER): Payer: BC Managed Care – PPO

## 2019-08-12 ENCOUNTER — Observation Stay (HOSPITAL_COMMUNITY): Payer: BC Managed Care – PPO

## 2019-08-12 DIAGNOSIS — D649 Anemia, unspecified: Secondary | ICD-10-CM | POA: Diagnosis not present

## 2019-08-12 DIAGNOSIS — R609 Edema, unspecified: Secondary | ICD-10-CM | POA: Diagnosis not present

## 2019-08-12 DIAGNOSIS — R0602 Shortness of breath: Secondary | ICD-10-CM | POA: Insufficient documentation

## 2019-08-12 DIAGNOSIS — I5031 Acute diastolic (congestive) heart failure: Secondary | ICD-10-CM | POA: Diagnosis not present

## 2019-08-12 DIAGNOSIS — R600 Localized edema: Secondary | ICD-10-CM | POA: Insufficient documentation

## 2019-08-12 LAB — CBC
HCT: 24.9 % — ABNORMAL LOW (ref 39.0–52.0)
Hemoglobin: 7.6 g/dL — ABNORMAL LOW (ref 13.0–17.0)
MCH: 25.4 pg — ABNORMAL LOW (ref 26.0–34.0)
MCHC: 30.5 g/dL (ref 30.0–36.0)
MCV: 83.3 fL (ref 80.0–100.0)
Platelets: 458 10*3/uL — ABNORMAL HIGH (ref 150–400)
RBC: 2.99 MIL/uL — ABNORMAL LOW (ref 4.22–5.81)
RDW: 13 % (ref 11.5–15.5)
WBC: 6.8 10*3/uL (ref 4.0–10.5)
nRBC: 0 % (ref 0.0–0.2)

## 2019-08-12 LAB — PHOSPHORUS: Phosphorus: 3.3 mg/dL (ref 2.5–4.6)

## 2019-08-12 LAB — CBG MONITORING, ED
Glucose-Capillary: 188 mg/dL — ABNORMAL HIGH (ref 70–99)
Glucose-Capillary: 313 mg/dL — ABNORMAL HIGH (ref 70–99)
Glucose-Capillary: 326 mg/dL — ABNORMAL HIGH (ref 70–99)
Glucose-Capillary: 336 mg/dL — ABNORMAL HIGH (ref 70–99)
Glucose-Capillary: 266 mg/dL — ABNORMAL HIGH (ref 70–99)

## 2019-08-12 LAB — BRAIN NATRIURETIC PEPTIDE: B Natriuretic Peptide: 52 pg/mL (ref 0.0–100.0)

## 2019-08-12 LAB — BASIC METABOLIC PANEL
Anion gap: 7 (ref 5–15)
BUN: 31 mg/dL — ABNORMAL HIGH (ref 6–20)
CO2: 23 mmol/L (ref 22–32)
Calcium: 8.5 mg/dL — ABNORMAL LOW (ref 8.9–10.3)
Chloride: 105 mmol/L (ref 98–111)
Creatinine, Ser: 1.66 mg/dL — ABNORMAL HIGH (ref 0.61–1.24)
GFR calc Af Amer: 57 mL/min — ABNORMAL LOW (ref >60)
GFR calc non Af Amer: 49 mL/min — ABNORMAL LOW (ref >60)
Glucose, Bld: 258 mg/dL — ABNORMAL HIGH (ref 70–99)
Potassium: 5.4 mmol/L — ABNORMAL HIGH (ref 3.5–5.1)
Sodium: 135 mmol/L (ref 135–145)

## 2019-08-12 LAB — PROTIME-INR
INR: 1.1 (ref 0.8–1.2)
Prothrombin Time: 14.3 seconds (ref 11.4–15.2)

## 2019-08-12 LAB — VITAMIN B12: Vitamin B-12: 654 pg/mL (ref 180–914)

## 2019-08-12 LAB — ECHOCARDIOGRAM COMPLETE
Height: 66 in
Weight: 3184 oz

## 2019-08-12 LAB — SARS CORONAVIRUS 2 (TAT 6-24 HRS): SARS Coronavirus 2: NEGATIVE

## 2019-08-12 LAB — TSH: TSH: 4.172 u[IU]/mL (ref 0.350–4.500)

## 2019-08-12 LAB — ABO/RH: ABO/RH(D): O POS

## 2019-08-12 LAB — HEMOGLOBIN A1C
Hgb A1c MFr Bld: 6.9 % — ABNORMAL HIGH (ref 4.8–5.6)
Mean Plasma Glucose: 151.33 mg/dL

## 2019-08-12 LAB — PREPARE RBC (CROSSMATCH)

## 2019-08-12 LAB — HIV ANTIBODY (ROUTINE TESTING W REFLEX): HIV Screen 4th Generation wRfx: NONREACTIVE

## 2019-08-12 LAB — MAGNESIUM: Magnesium: 1.9 mg/dL (ref 1.7–2.4)

## 2019-08-12 MED ORDER — VITAMIN D (ERGOCALCIFEROL) 1.25 MG (50000 UNIT) PO CAPS
50000.0000 [IU] | ORAL_CAPSULE | ORAL | Status: DC
  Administered 2019-08-12: 50000 [IU] via ORAL
  Filled 2019-08-12: qty 1

## 2019-08-12 MED ORDER — INSULIN GLARGINE 100 UNIT/ML ~~LOC~~ SOLN
25.0000 [IU] | SUBCUTANEOUS | Status: AC
  Administered 2019-08-12: 25 [IU] via SUBCUTANEOUS
  Filled 2019-08-12: qty 0.25

## 2019-08-12 MED ORDER — IRBESARTAN 75 MG PO TABS
37.5000 mg | ORAL_TABLET | Freq: Every day | ORAL | Status: DC
  Administered 2019-08-12: 37.5 mg via ORAL
  Filled 2019-08-12: qty 0.5

## 2019-08-12 MED ORDER — FERROUS SULFATE 325 (65 FE) MG PO TABS: 325.0000 mg | ORAL_TABLET | Freq: Two times a day (BID) | ORAL | Status: DC

## 2019-08-12 MED ORDER — INSULIN ASPART 100 UNIT/ML ~~LOC~~ SOLN
0.0000 [IU] | Freq: Three times a day (TID) | SUBCUTANEOUS | Status: DC
  Administered 2019-08-12 (×2): 7 [IU] via SUBCUTANEOUS
  Administered 2019-08-12: 5 [IU] via SUBCUTANEOUS
  Filled 2019-08-12: qty 0.09

## 2019-08-12 MED ORDER — FERROUS SULFATE 325 (65 FE) MG PO TABS: 325.0000 mg | ORAL_TABLET | Freq: Two times a day (BID) | ORAL | 3 refills | Status: DC

## 2019-08-12 MED ORDER — AMLODIPINE BESYLATE 5 MG PO TABS
5.0000 mg | ORAL_TABLET | Freq: Every day | ORAL | Status: DC
Start: 2019-08-12 — End: 2019-08-12
  Administered 2019-08-12: 5 mg via ORAL
  Filled 2019-08-12: qty 1

## 2019-08-12 MED ORDER — EZETIMIBE 10 MG PO TABS
10.0000 mg | ORAL_TABLET | Freq: Every day | ORAL | Status: DC
  Administered 2019-08-12: 10 mg via ORAL
  Filled 2019-08-12: qty 1

## 2019-08-12 MED ORDER — ATORVASTATIN CALCIUM 40 MG PO TABS
40.0000 mg | ORAL_TABLET | Freq: Every day | ORAL | Status: DC
  Filled 2019-08-12: qty 1

## 2019-08-12 MED ORDER — PANTOPRAZOLE SODIUM 40 MG PO TBEC
40.0000 mg | DELAYED_RELEASE_TABLET | Freq: Every day | ORAL | Status: DC
  Administered 2019-08-12: 40 mg via ORAL
  Filled 2019-08-12: qty 1

## 2019-08-12 MED ORDER — ASPIRIN EC 81 MG PO TBEC
81.0000 mg | DELAYED_RELEASE_TABLET | Freq: Every day | ORAL | Status: DC
  Administered 2019-08-12: 81 mg via ORAL
  Filled 2019-08-12: qty 1

## 2019-08-12 MED ORDER — SENNOSIDES-DOCUSATE SODIUM 8.6-50 MG PO TABS: 2.0000 | ORAL_TABLET | Freq: Every day | ORAL | 1 refills | Status: AC | PRN

## 2019-08-12 MED ORDER — INSULIN ASPART 100 UNIT/ML ~~LOC~~ SOLN
0.0000 [IU] | Freq: Every day | SUBCUTANEOUS | Status: DC
  Filled 2019-08-12: qty 0.05

## 2019-08-12 MED ORDER — INSULIN ASPART 100 UNIT/ML ~~LOC~~ SOLN
3.0000 [IU] | SUBCUTANEOUS | Status: AC
  Administered 2019-08-12: 3 [IU] via SUBCUTANEOUS
  Filled 2019-08-12: qty 0.03

## 2019-08-12 MED ORDER — METOPROLOL SUCCINATE ER 50 MG PO TB24
50.0000 mg | ORAL_TABLET | Freq: Every day | ORAL | Status: DC
  Administered 2019-08-12: 50 mg via ORAL
  Filled 2019-08-12: qty 1

## 2019-08-12 MED ORDER — SODIUM CHLORIDE 0.9 % IV SOLN
510.0000 mg | Freq: Once | INTRAVENOUS | Status: AC
  Administered 2019-08-12: 510 mg via INTRAVENOUS
  Filled 2019-08-12: qty 510

## 2019-08-12 MED ORDER — SODIUM CHLORIDE 0.9% IV SOLUTION: Freq: Once | INTRAVENOUS | Status: AC

## 2019-08-12 MED ORDER — INSULIN ASPART 100 UNIT/ML ~~LOC~~ SOLN
5.0000 [IU] | Freq: Three times a day (TID) | SUBCUTANEOUS | Status: DC
  Administered 2019-08-12 (×2): 5 [IU] via SUBCUTANEOUS
  Filled 2019-08-12: qty 0.05

## 2019-08-12 NOTE — ED Notes (Signed)
Pt provided with meal tray at this time 

## 2019-08-12 NOTE — Progress Notes (Signed)
Inpatient Diabetes Program Recommendations  AACE/ADA: New Consensus Statement on Inpatient Glycemic Control (2015)  Target Ranges:  Prepandial:   less than 140 mg/dL      Peak postprandial:   less than 180 mg/dL (1-2 hours)      Critically ill patients:  140 - 180 mg/dL   Lab Results  Component Value Date   GLUCAP 326 (H) 08/12/2019   HGBA1C 6.9 (H) 08/12/2019    Review of Glycemic Control Results for Javier Hunt, Javier Hunt (MRN [id number]) as of 08/12/2019 09:40  Ref. Range 08/12/2019 01:25 08/12/2019 08:40 08/12/2019 09:13  Glucose-Capillary Latest Ref Range: 70 - 99 mg/dL 188 (H) 313 (H) 326 (H)   Diabetes history: DM 1 since age 44 Outpatient Diabetes medications: Insulin pump-670G with medtronic sensor- Per review of insulin pump settings, his average basal dose for the past 7 days was 24.5 units- He also covers CHO with 1 units/10 grams of CHO and his correction factor is approximately 40 mg/dL with a goal oc 100 mg/dL Current orders for Inpatient glycemic control:  Novolog sensitive tid with meals  Inpatient Diabetes Program Recommendations:    Reviewed insulin pump.  Per patient he was running out of insulin in his pump.  He received Novolog 7 units this AM for blood sugar of 313 mg/dL.  Needs basal insulin ASAP.  Recommend Lantus 25 units daily with first dose now and Novolog 3 units to cover CHO in meal he just consumed (45 grams of CHO).   Also please add Novolog 5 units tid with meals (hold if patient eats less than 50% or if patient NPO).  Discussed plan with patient and reminded him that insulin pump should not be restarted until 24 hours after Lantus administered.  Thanks,  Adah Perl, RN, BC-ADM Inpatient Diabetes Coordinator Pager [phone] (8a-5p)

## 2019-08-12 NOTE — Discharge Summary (Signed)
Physician Discharge Summary  Javier Hunt Hunt:[id number] DOB: 04/07/1976 DOA: 08/11/2019  PCP: [name], MD  Admit date: 08/11/2019 Discharge date: 08/12/2019  Admitted From:home  Disposition:  home Recommendations for Outpatient Follow-up:  1. Follow up with PCP in 1-2 weeks 2. Please obtain BMP/CBC in one week  Home Health none Equipment/Devices none  discharge Condition: Stable and improved CODE STATUS full code Diet recommendation: Cardiac diet Brief/Interim Summary: 44 year old African-American male with past medical history significant for diabetes mellitus, chronic kidney disease with baseline serum creatinine of 1.8, likely previously undiagnosed hypertension, obesity and significant right lower extremity edema (patient also has mild edema involving left lower extremity).  Apparently, patient has had right lower extremity edema for over 2 months.  Patient has had negative venous Doppler ultrasound of the right lower extremity on 2 occasions at least.  Patient went to see the primary care provider for follow-up of the right lower extremity edema and was told that his hemoglobin was low.  On further questioning, patient endorsed shortness of breath, dyspnea on exertion and fatigue.  Patient reports feeling cold with associated night sweats, but no fever.  On presentation to the hospital, patient was found to have hemoglobin of 7.3 g/dL, serum creatinine of 1.98 (baseline is 1.8), negative fecal occult blood, MCV of 83.3, 20, TIBC of 210, ferritin of 411 and folate of 12.6.  No headache, no neck pain, no chest pain, no URI symptoms, no GI symptoms and no urinary symptoms.  Patient be admitted for further assessment and management.  Ct abd/pelvis-No CT evidence for acute intra-abdominal or pelvic abnormality. Moderate nonspecific perinephric fat stranding without hydronephrosis  2.9 cm hypodense liver mass, likely corresponding to echogenic mass on ultrasound, possible hemangioma though  measures slightly larger than on the prior ultrasound (1.8 cm on ultrasound). This could be more definitively characterized with contrast-enhanced CT or MRI if /when clinically feasible.  Discharge Diagnoses:  Active Problems:   Symptomatic anemia  #1 symptomatic iron deficiency anemia-likely a combination of anemia of chronic disease and CKD-patient was given 2 units of blood transfusion.  He was also given 1 unit of iron transfusion.  His fecal occult blood was negative.  He was asked to follow-up with GI as an outpatient.  #2 history of CKD stage IIIa follow-up with outpatient nephrologist.  #3 type 2 diabetes has an insulin pump in place continue  #4 history of essential hypertension continue home meds.  #5right lower extremity edema/cellulitis  -pcp started on antibiotics continue.  Estimated body mass index is 32.12 kg/m as calculated from the following:   Height as of this encounter: 5\' 6"  (1.676 m).   Weight as of this encounter: 90.3 kg.  Discharge Instructions  Discharge Instructions    Diet - low sodium heart healthy   Complete by: As directed    Increase activity slowly   Complete by: As directed      Allergies as of 08/12/2019      Reactions   Lisinopril Cough   Keflex [cephalexin] Rash      Medication List    STOP taking these medications   gentamicin cream 0.1 % Commonly known as: GARAMYCIN     TAKE these medications   amLODipine 5 MG tablet Commonly known as: NORVASC Take 5 mg by mouth daily.   aspirin 81 MG tablet Take 81 mg by mouth daily.   atorvastatin 40 MG tablet Commonly known as: LIPITOR Take 40 mg by mouth daily at 6 PM.   Contour Next Test  test strip Generic drug: glucose blood USE 8 TIMES DAILY AND AS DIRECTED   ezetimibe 10 MG tablet Commonly known as: ZETIA Take 10 mg by mouth daily.   insulin pump Soln Inject into the skin. Novolog. Base: 25.45 units per day.  1 unit per 10 grams of carbohydrates.   metoprolol  succinate 50 MG 24 hr tablet Commonly known as: TOPROL-XL Take 50 mg by mouth daily. Take with or immediately following a meal.   olmesartan 5 MG tablet Commonly known as: BENICAR Take 10 mg by mouth daily.   omeprazole 40 MG capsule Commonly known as: PRILOSEC Take 40 mg by mouth daily.   senna-docusate 8.6-50 MG tablet Commonly known as: Senokot-S Take 2 tablets by mouth daily as needed for mild constipation.   tadalafil 20 MG tablet Commonly known as: CIALIS Take 20 mg by mouth daily as needed for erectile dysfunction.   Vitamin D (Ergocalciferol) 1.25 MG (50000 UNIT) Caps capsule Commonly known as: DRISDOL Take 50,000 Units by mouth 2 (two) times a week. Monday and Wednesday      Follow-up Information    [name], MD Follow up.   Specialty: Internal Medicine Contact information: [address] [place] [address] [phone]          Allergies  Allergen Reactions  . Lisinopril Cough  . Keflex [Cephalexin] Rash    Consultations: none  Procedures/Studies: CT ABDOMEN PELVIS WO CONTRAST  Result Date: 08/11/2019 CLINICAL DATA:  Pitting edema to the lower extremity EXAM: CT ABDOMEN AND PELVIS WITHOUT CONTRAST TECHNIQUE: Multidetector CT imaging of the abdomen and pelvis was performed following the standard protocol without IV contrast. COMPARISON:  Ultrasound 06/30/2019 FINDINGS: Lower chest: Lung bases demonstrate no acute consolidation or effusion. The heart size is within normal limits. Hepatobiliary: 2.9 cm hypodense mass in the right hepatic lobe, likely corresponding to echogenic mass noted on ultrasound from September 2020. No calcified gallstone or biliary dilatation. Pancreas: Unremarkable. No pancreatic ductal dilatation or surrounding inflammatory changes. Spleen: Normal in size without focal abnormality. Adrenals/Urinary Tract: Adrenal glands are normal. No hydronephrosis. Moderate perinephric fat stranding. Thick-walled urinary bladder  Stomach/Bowel: Stomach is nonenlarged. No dilated small bowel. No bowel wall thickening hyperdense material within the colon and rectum. Vascular/Lymphatic: Mild aortic atherosclerosis. No aneurysm. Subcentimeter retroperitoneal lymph nodes. Mildly prominent right inguinal lymph nodes measuring up to 16 mm Reproductive: Prostate is unremarkable. Other: Negative for free air or free fluid Musculoskeletal: No acute or significant osseous findings. IMPRESSION: 1. No CT evidence for acute intra-abdominal or pelvic abnormality. Moderate nonspecific perinephric fat stranding without hydronephrosis 2. 2.9 cm hypodense liver mass, likely corresponding to echogenic mass on ultrasound, possible hemangioma though measures slightly larger than on the prior ultrasound (1.8 cm on ultrasound). This could be more definitively characterized with contrast-enhanced CT or MRI if /when clinically feasible. Electronically Signed   By: Donavan Foil M.D.   On: 08/11/2019 21:29   X-ray chest PA and lateral  Result Date: 08/12/2019 CLINICAL DATA:  Shortness of breath EXAM: CHEST - 2 VIEW COMPARISON:  01/15/2013 FINDINGS: The heart size and mediastinal contours are within normal limits. Both lungs are clear. The visualized skeletal structures are unremarkable. IMPRESSION: No active cardiopulmonary disease. Electronically Signed   By: Constance Holster M.D.   On: 08/12/2019 02:14   ECHOCARDIOGRAM COMPLETE  Result Date: 08/12/2019    ECHOCARDIOGRAM REPORT   Patient Name:   Javier Hunt Date of Exam: 08/12/2019 Medical Rec #:  [id number]  Height:       66.0 in Accession #:    [id number]     Weight:       199.0 lb Date of Birth:  1976-01-18       BSA:          1.996 m Patient Age:    62 years       BP:           150/81 mmHg Patient Gender: M              HR:           80 bpm. Exam Location:  Inpatient Procedure: 2D Echo, Cardiac Doppler and Color Doppler Indications:    CHF-Acute DIastolic 734.19  History:        Patient has no prior  history of Echocardiogram examinations.                 Risk Factors:Non-Smoker.  Sonographer:    Vickie Epley RDCS Referring Phys: [place]  1. Left ventricular ejection fraction, by estimation, is 55 to 60%. The left ventricle has normal function. The left ventricle has no regional wall motion abnormalities. Left ventricular diastolic parameters are consistent with Grade I diastolic dysfunction (impaired relaxation).  2. Right ventricular systolic function is normal. The right ventricular size is normal. There is normal pulmonary artery systolic pressure.  3. The mitral valve is abnormal. Trivial mitral valve regurgitation.  4. The aortic valve is tricuspid. Aortic valve regurgitation is not visualized. No aortic stenosis is present.  5. The inferior vena cava is normal in size with greater than 50% respiratory variability, suggesting right atrial pressure of 3 mmHg. FINDINGS  Left Ventricle: Left ventricular ejection fraction, by estimation, is 55 to 60%. The left ventricle has normal function. The left ventricle has no regional wall motion abnormalities. The left ventricular internal cavity size was normal in size. There is  no left ventricular hypertrophy. Left ventricular diastolic parameters are consistent with Grade I diastolic dysfunction (impaired relaxation). Normal left ventricular filling pressure. Right Ventricle: The right ventricular size is normal. No increase in right ventricular wall thickness. Right ventricular systolic function is normal. There is normal pulmonary artery systolic pressure. The tricuspid regurgitant velocity is 2.51 m/s, and  with an assumed right atrial pressure of 3 mmHg, the estimated right ventricular systolic pressure is 37.9 mmHg. Left Atrium: Left atrial size was normal in size. Right Atrium: Right atrial size was normal in size. Pericardium: There is no evidence of pericardial effusion. Mitral Valve: The mitral valve is abnormal. There is mild  thickening of the mitral valve leaflet(s). Trivial mitral valve regurgitation. Tricuspid Valve: The tricuspid valve is grossly normal. Tricuspid valve regurgitation is trivial. Aortic Valve: The aortic valve is tricuspid. Aortic valve regurgitation is not visualized. No aortic stenosis is present. Pulmonic Valve: The pulmonic valve was grossly normal. Pulmonic valve regurgitation is not visualized. Aorta: The aortic root, ascending aorta, aortic arch and descending aorta are all structurally normal, with no evidence of dilitation or obstruction. Venous: The inferior vena cava is normal in size with greater than 50% respiratory variability, suggesting right atrial pressure of 3 mmHg. IAS/Shunts: No atrial level shunt detected by color flow Doppler.  LEFT VENTRICLE PLAX 2D LVIDd:         4.90 cm      Diastology LVIDs:         3.60 cm      LV e' lateral:   13.80 cm/s LV  PW:         0.80 cm      LV E/e' lateral: 5.1 LV IVS:        0.80 cm      LV e' medial:    8.38 cm/s LVOT diam:     2.20 cm      LV E/e' medial:  8.4 LV SV:         76 LV SV Index:   38 LVOT Area:     3.80 cm  LV Volumes (MOD) LV vol d, MOD A2C: 103.0 ml LV vol d, MOD A4C: 148.0 ml LV vol s, MOD A2C: 46.7 ml LV vol s, MOD A4C: 65.9 ml LV SV MOD A2C:     56.3 ml LV SV MOD A4C:     148.0 ml LV SV MOD BP:      70.1 ml RIGHT VENTRICLE RV S prime:     13.90 cm/s TAPSE (M-mode): 2.0 cm LEFT ATRIUM           Index       RIGHT ATRIUM           Index LA diam:      4.00 cm 2.00 cm/m  RA Area:     14.60 cm LA Vol (A2C): 46.0 ml 23.05 ml/m RA Volume:   31.80 ml  15.94 ml/m LA Vol (A4C): 27.3 ml 13.68 ml/m  AORTIC VALVE LVOT Vmax:   91.40 cm/s LVOT Vmean:  59.000 cm/s LVOT VTI:    0.199 m  AORTA Ao Root diam: 2.60 cm MITRAL VALVE               TRICUSPID VALVE MV Area (PHT): 3.83 cm    TR Peak grad:   25.2 mmHg MV Decel Time: 198 msec    TR Vmax:        251.00 cm/s MV E velocity: 70.30 cm/s MV A velocity: 49.70 cm/s  SHUNTS MV E/A ratio:  1.41        Systemic  VTI:  0.20 m                            Systemic Diam: 2.20 cm Lyman Bishop MD Electronically signed by Lyman Bishop MD Signature Date/Time: 08/12/2019/10:41:01 AM    Final    VAS [place] LOWER EXTREMITY VENOUS REFLUX  Result Date: 07/27/2019  Lower Venous Reflux Study Indications: Swelling, and Pain.  Performing Technologist: Alvia Grove RVT  Examination Guidelines: A complete evaluation includes B-mode imaging, spectral Doppler, color Doppler, and power Doppler as needed of all accessible portions of each vessel. Bilateral testing is considered an integral part of a complete examination. Limited examinations for reoccurring indications may be performed as noted. The reflux portion of the exam is performed with the patient in reverse Trendelenburg. Significant venous reflux is defined as >500 ms in the superficial venous system, and >1 second in the deep venous system.  Venous Reflux Times +--------------+---------+------+-----------+------------+--------+ RIGHT         Reflux NoRefluxReflux TimeDiameter cmsComments                         Yes                                  +--------------+---------+------+-----------+------------+--------+ GSV at [facility]  0.87             +--------------+---------+------+-----------+------------+--------+ GSV prox thigh                              0.81             +--------------+---------+------+-----------+------------+--------+ GSV mid thigh                               0.42             +--------------+---------+------+-----------+------------+--------+ GSV dist thigh                              0.42             +--------------+---------+------+-----------+------------+--------+ GSV at knee                                 0.40             +--------------+---------+------+-----------+------------+--------+ GSV prox calf                               0.36              +--------------+---------+------+-----------+------------+--------+ GSV mid calf                                0.25             +--------------+---------+------+-----------+------------+--------+ GSV dist calf                               0.27             +--------------+---------+------+-----------+------------+--------+ SSV Pop Fossa                               0.75             +--------------+---------+------+-----------+------------+--------+ SSV prox calf                               0.59             +--------------+---------+------+-----------+------------+--------+ SSV mid calf                                0.42             +--------------+---------+------+-----------+------------+--------+   Summary: Right: - No evidence of deep vein thrombosis seen in the right lower extremity, from the common femoral through the popliteal veins. - There is no evidence of venous reflux seen in the right lower extremity deep system. - No evidence of superficial venous reflux seen in the right greater saphenous vein. - No evidence of superficial venous reflux seen in the right short saphenous vein. NOTE: Enlarged lymph node in the right groin  *See table(s) above for measurements and observations. Electronically signed by Harold Barban MD on 07/27/2019 at 9:57:54 AM.    Final     (Echo, Carotid, EGD, Colonoscopy, ERCP)    Subjective:  Resting in bed in nad  Discharge Exam: Vitals:   08/12/19 0930 08/12/19 1000  BP: (!) 145/93 (!) 149/83  Pulse: 80 80  Resp: (!) 8 16  Temp:    SpO2: 98% 96%   Vitals:   08/12/19 0730 08/12/19 0800 08/12/19 0930 08/12/19 1000  BP: (!) 150/68 (!) 162/85 (!) 145/93 (!) 149/83  Pulse: 79 78 80 80  Resp: 15 15 (!) 8 16  Temp:      TempSrc:      SpO2: 98% 96% 98% 96%  Weight:      Height:        General: Pt is alert, awake, not in acute distress Cardiovascular: RRR, S1/S2 +, no rubs, no gallops Respiratory: CTA bilaterally, no  wheezing, no rhonchi Abdominal: Soft, NT, ND, bowel sounds + Extremities:RLE EDEMA with erythema   The results of significant diagnostics from this hospitalization (including imaging, microbiology, ancillary and laboratory) are listed below for reference.     Microbiology: Recent Results (from the past 240 hour(s))  SARS CORONAVIRUS 2 (TAT 6-24 HRS) Nasopharyngeal Nasopharyngeal Swab     Status: None   Collection Time: 08/11/19  6:10 PM   Specimen: Nasopharyngeal Swab  Result Value Ref Range Status   SARS Coronavirus 2 NEGATIVE NEGATIVE Final    Comment: (NOTE) SARS-CoV-2 target nucleic acids are NOT DETECTED. The SARS-CoV-2 RNA is generally detectable in upper and lower respiratory specimens during the acute phase of infection. Negative results do not preclude SARS-CoV-2 infection, do not rule out co-infections with other pathogens, and should not be used as the sole basis for treatment or other patient management decisions. Negative results must be combined with clinical observations, patient history, and epidemiological information. The expected result is Negative. Fact Sheet for Patients: SugarRoll.be Fact Sheet for Healthcare Providers: https://www.woods-[employer].com/ This test is not yet approved or cleared by the [place] FDA and  has been authorized for detection and/or diagnosis of SARS-CoV-2 by FDA under an Emergency Use Authorization (EUA). This EUA will remain  in effect (meaning this test can be used) for the duration of the COVID-19 declaration under Section 56 4(b)(1) of the Act, 21 U.S.C. section 360bbb-3(b)(1), unless the authorization is terminated or revoked sooner. Performed at [facility], [place] [address], [place], [place] [address]      Labs: BNP (last 3 results) Recent Labs    08/12/19 0202  BNP 64.4   Basic Metabolic Panel: Recent Labs  Lab 08/11/19 1642 08/12/19 0121 08/12/19 0527  NA 136   --  135  K 4.5  --  5.4*  CL 103  --  105  CO2 25  --  23  GLUCOSE 203*  --  258*  BUN 38*  --  31*  CREATININE 1.98*  --  1.66*  CALCIUM 8.7*  --  8.5*  MG  --  1.9  --   PHOS  --  3.3  --    Liver Function Tests: Recent Labs  Lab 08/11/19 1642  AST 15  ALT 12  ALKPHOS 90  BILITOT 0.4  PROT 7.1  ALBUMIN 2.6*   No results for input(s): LIPASE, AMYLASE in the last 168 hours. No results for input(s): AMMONIA in the last 168 hours. CBC: Recent Labs  Lab 08/11/19 1642 08/12/19 0527  WBC 9.2 6.8  NEUTROABS 6.0  --   HGB 7.3* 7.6*  HCT 24.5* 24.9*  MCV 83.3 83.3  PLT 470* 458*   Cardiac Enzymes: No results for input(s): CKTOTAL, CKMB,  CKMBINDEX, TROPONINI in the last 168 hours. BNP: Invalid input(s): POCBNP CBG: Recent Labs  Lab 08/12/19 0125 08/12/19 0840 08/12/19 0913  GLUCAP 188* 313* 326*   D-Dimer No results for input(s): DDIMER in the last 72 hours. Hgb A1c Recent Labs    08/12/19 0121  HGBA1C 6.9*   Lipid Profile No results for input(s): CHOL, HDL, LDLCALC, TRIG, CHOLHDL, LDLDIRECT in the last 72 hours. Thyroid function studies Recent Labs    08/12/19 0121  TSH 4.172   Anemia work up Recent Labs    08/11/19 1810 08/12/19 0121  VITAMINB12 710 654  FOLATE 12.6  --   FERRITIN 411*  --   TIBC 210*  --   IRON 20*  --   RETICCTPCT 2.5  --    Urinalysis    Component Value Date/Time   COLORURINE STRAW (A) 08/11/2019 2030   APPEARANCEUR CLEAR 08/11/2019 2030   LABSPEC 1.008 08/11/2019 2030   PHURINE 5.0 08/11/2019 2030   GLUCOSEU 150 (A) 08/11/2019 2030   HGBUR SMALL (A) 08/11/2019 2030   BILIRUBINUR NEGATIVE 08/11/2019 2030   [place] 08/11/2019 2030   PROTEINUR 100 (A) 08/11/2019 2030   NITRITE NEGATIVE 08/11/2019 2030   LEUKOCYTESUR NEGATIVE 08/11/2019 2030   Sepsis Labs Invalid input(s): PROCALCITONIN,  WBC,  LACTICIDVEN Microbiology Recent Results (from the past 240 hour(s))  SARS CORONAVIRUS 2 (TAT 6-24 HRS)  Nasopharyngeal Nasopharyngeal Swab     Status: None   Collection Time: 08/11/19  6:10 PM   Specimen: Nasopharyngeal Swab  Result Value Ref Range Status   SARS Coronavirus 2 NEGATIVE NEGATIVE Final    Comment: (NOTE) SARS-CoV-2 target nucleic acids are NOT DETECTED. The SARS-CoV-2 RNA is generally detectable in upper and lower respiratory specimens during the acute phase of infection. Negative results do not preclude SARS-CoV-2 infection, do not rule out co-infections with other pathogens, and should not be used as the sole basis for treatment or other patient management decisions. Negative results must be combined with clinical observations, patient history, and epidemiological information. The expected result is Negative. Fact Sheet for Patients: SugarRoll.be Fact Sheet for Healthcare Providers: https://www.woods[employer].com/ This test is not yet approved or cleared by the [place] FDA and  has been authorized for detection and/or diagnosis of SARS-CoV-2 by FDA under an Emergency Use Authorization (EUA). This EUA will remain  in effect (meaning this test can be used) for the duration of the COVID-19 declaration under Section 56 4(b)(1) of the Act, 21 U.S.C. section 360bbb-3(b)(1), unless the authorization is terminated or revoked sooner. Performed at [facility], [place] [address] [place], [place] [address]      Time coordinating discharge: 38 minutes  SIGNED:   Georgette Shell, MD  [facility] 08/12/2019, 11:17 AM Pager   If 7PM-7AM, please contact night-coverage [email or web address] Password [email or web address]

## 2019-08-12 NOTE — Progress Notes (Signed)
  Echocardiogram 2D Echocardiogram has been performed.  Michiel Cowboy 08/12/2019, 8:43 AM

## 2019-08-12 NOTE — Discharge Instructions (Signed)
Anemia  Anemia is a condition in which you do not have enough red blood cells or hemoglobin. Hemoglobin is a substance in red blood cells that carries oxygen. When you do not have enough red blood cells or hemoglobin (are anemic), your body cannot get enough oxygen and your organs may not work properly. As a result, you may feel very tired or have other problems. What are the causes? Common causes of anemia include:  Excessive bleeding. Anemia can be caused by excessive bleeding inside or outside the body, including bleeding from the intestine or from periods in women.  Poor nutrition.  Long-lasting (chronic) kidney, thyroid, and liver disease.  Bone marrow disorders.  Cancer and treatments for cancer.  HIV (human immunodeficiency virus) and AIDS (acquired immunodeficiency syndrome).  Treatments for HIV and AIDS.  Spleen problems.  Blood disorders.  Infections, medicines, and autoimmune disorders that destroy red blood cells. What are the signs or symptoms? Symptoms of this condition include:  Minor weakness.  Dizziness.  Headache.  Feeling heartbeats that are irregular or faster than normal (palpitations).  Shortness of breath, especially with exercise.  Paleness.  Cold sensitivity.  Indigestion.  Nausea.  Difficulty sleeping.  Difficulty concentrating. Symptoms may occur suddenly or develop slowly. If your anemia is mild, you may not have symptoms. How is this diagnosed? This condition is diagnosed based on:  Blood tests.  Your medical history.  A physical exam.  Bone marrow biopsy. Your health care provider may also check your stool (feces) for blood and may do additional testing to look for the cause of your bleeding. You may also have other tests, including:  Imaging tests, such as a CT scan or MRI.  Endoscopy.  Colonoscopy. How is this treated? Treatment for this condition depends on the cause. If you continue to lose a lot of blood, you may  need to be treated at a hospital. Treatment may include:  Taking supplements of iron, vitamin S31, or folic acid.  Taking a hormone medicine (erythropoietin) that can help to stimulate red blood cell growth.  Having a blood transfusion. This may be needed if you lose a lot of blood.  Making changes to your diet.  Having surgery to remove your spleen. Follow these instructions at home:  Take over-the-counter and prescription medicines only as told by your health care provider.  Take supplements only as told by your health care provider.  Follow any diet instructions that you were given.  Keep all follow-up visits as told by your health care provider. This is important. Contact a health care provider if:  You develop new bleeding anywhere in the body. Get help right away if:  You are very weak.  You are short of breath.  You have pain in your abdomen or chest.  You are dizzy or feel faint.  You have trouble concentrating.  You have bloody or black, tarry stools.  You vomit repeatedly or you vomit up blood. Summary  Anemia is a condition in which you do not have enough red blood cells or enough of a substance in your red blood cells that carries oxygen (hemoglobin).  Symptoms may occur suddenly or develop slowly.  If your anemia is mild, you may not have symptoms.  This condition is diagnosed with blood tests as well as a medical history and physical exam. Other tests may be needed.  Treatment for this condition depends on the cause of the anemia. This information is not intended to replace advice given to you by  your health care provider. Make sure you discuss any questions you have with your health care provider. Document Revised: 05/03/2017 Document Reviewed: 06/22/2016 Elsevier Patient Education  [place].   Peripheral Edema  Peripheral edema is swelling that is caused by a buildup of fluid. Peripheral edema most often affects the lower legs, ankles,  and feet. It can also develop in the arms, hands, and face. The area of the body that has peripheral edema will look swollen. It may also feel heavy or warm. Your clothes may start to feel tight. Pressing on the area may make a temporary dent in your skin. You may not be able to move your swollen arm or leg as much as usual. There are many causes of peripheral edema. It can happen because of a complication of other conditions such as congestive heart failure, kidney disease, or a problem with your blood circulation. It also can be a side effect of certain medicines or because of an infection. It often happens to women during pregnancy. Sometimes, the cause is not known. Follow these instructions at home: Managing pain, stiffness, and swelling   Raise (elevate) your legs while you are sitting or lying down.  Move around often to prevent stiffness and to lessen swelling.  Do not sit or stand for long periods of time.  Wear support stockings as told by your health care provider. Medicines  Take over-the-counter and prescription medicines only as told by your health care provider.  Your health care provider may prescribe medicine to help your body get rid of excess water (diuretic). General instructions  Pay attention to any changes in your symptoms.  Follow instructions from your health care provider about limiting salt (sodium) in your diet. Sometimes, eating less salt may reduce swelling.  Moisturize skin daily to help prevent skin from cracking and draining.  Keep all follow-up visits as told by your health care provider. This is important. Contact a health care provider if you have:  A fever.  Edema that starts suddenly or is getting worse, especially if you are pregnant or have a medical condition.  Swelling in only one leg.  Increased swelling, redness, or pain in one or both of your legs.  Drainage or sores at the area where you have edema. Get help right away if  you:  Develop shortness of breath, especially when you are lying down.  Have pain in your chest or abdomen.  Feel weak.  Feel faint. Summary  Peripheral edema is swelling that is caused by a buildup of fluid. Peripheral edema most often affects the lower legs, ankles, and feet.  Move around often to prevent stiffness and to lessen swelling. Do not sit or stand for long periods of time.  Pay attention to any changes in your symptoms.  Contact a health care provider if you have edema that starts suddenly or is getting worse, especially if you are pregnant or have a medical condition.  Get help right away if you develop shortness of breath, especially when lying down. This information is not intended to replace advice given to you by your health care provider. Make sure you discuss any questions you have with your health care provider. Document Revised: 02/12/2018 Document Reviewed: 02/12/2018 Elsevier Patient Education  [place].

## 2019-08-12 NOTE — ED Notes (Signed)
Pt to be discharged after Blood infusion and iron infusion per Zigmund Daniel MD

## 2019-08-13 LAB — BPAM RBC
Blood Product Expiration Date: 202104112359
Blood Product Expiration Date: 202104112359
ISSUE DATE / TIME: 202103092037
ISSUE DATE / TIME: 202103101253
Unit Type and Rh: 5100
Unit Type and Rh: 5100

## 2019-08-13 LAB — TYPE AND SCREEN
ABO/RH(D): O POS
Antibody Screen: NEGATIVE
Unit division: 0
Unit division: 0

## 2019-08-13 LAB — ANTINUCLEAR ANTIBODIES, IFA: ANA Ab, IFA: NEGATIVE

## 2019-08-27 ENCOUNTER — Inpatient Hospital Stay (HOSPITAL_COMMUNITY): Payer: BC Managed Care – PPO

## 2019-08-27 ENCOUNTER — Ambulatory Visit: Payer: BC Managed Care – PPO | Admitting: Podiatry

## 2019-08-27 ENCOUNTER — Emergency Department (HOSPITAL_COMMUNITY)
Admit: 2019-08-27 | Discharge: 2019-08-27 | Disposition: A | Discharge status: Home / Self Care | Payer: BC Managed Care – PPO | Attending: Emergency Medicine | Admitting: Emergency Medicine

## 2019-08-27 ENCOUNTER — Inpatient Hospital Stay (HOSPITAL_COMMUNITY)
Admission: EM | Admit: 2019-08-27 | Discharge: 2019-09-01 | DRG: 041 | Disposition: A | Discharge status: Home Health | Payer: BC Managed Care – PPO | Attending: Internal Medicine | Admitting: Internal Medicine

## 2019-08-27 ENCOUNTER — Encounter: Payer: Self-pay | Admitting: Podiatry

## 2019-08-27 ENCOUNTER — Ambulatory Visit (INDEPENDENT_AMBULATORY_CARE_PROVIDER_SITE_OTHER): Payer: BC Managed Care – PPO

## 2019-08-27 ENCOUNTER — Other Ambulatory Visit: Payer: Self-pay

## 2019-08-27 ENCOUNTER — Encounter (HOSPITAL_COMMUNITY): Payer: Self-pay | Admitting: Emergency Medicine

## 2019-08-27 VITALS — BP 159/94 | HR 87 | Temp 101.5°F

## 2019-08-27 DIAGNOSIS — Z9641 Presence of insulin pump (external) (internal): Secondary | ICD-10-CM | POA: Diagnosis present

## 2019-08-27 DIAGNOSIS — E1022 Type 1 diabetes mellitus with diabetic chronic kidney disease: Secondary | ICD-10-CM | POA: Diagnosis present

## 2019-08-27 DIAGNOSIS — Z20822 Contact with and (suspected) exposure to covid-19: Secondary | ICD-10-CM | POA: Diagnosis present

## 2019-08-27 DIAGNOSIS — M14671 Charcot's joint, right ankle and foot: Secondary | ICD-10-CM | POA: Diagnosis not present

## 2019-08-27 DIAGNOSIS — R609 Edema, unspecified: Secondary | ICD-10-CM | POA: Diagnosis not present

## 2019-08-27 DIAGNOSIS — L97413 Non-pressure chronic ulcer of right heel and midfoot with necrosis of muscle: Secondary | ICD-10-CM | POA: Diagnosis not present

## 2019-08-27 DIAGNOSIS — Z888 Allergy status to other drugs, medicaments and biological substances status: Secondary | ICD-10-CM | POA: Diagnosis not present

## 2019-08-27 DIAGNOSIS — R509 Fever, unspecified: Secondary | ICD-10-CM | POA: Diagnosis present

## 2019-08-27 DIAGNOSIS — D631 Anemia in chronic kidney disease: Secondary | ICD-10-CM | POA: Diagnosis present

## 2019-08-27 DIAGNOSIS — Z881 Allergy status to other antibiotic agents status: Secondary | ICD-10-CM | POA: Diagnosis not present

## 2019-08-27 DIAGNOSIS — M86171 Other acute osteomyelitis, right ankle and foot: Secondary | ICD-10-CM | POA: Diagnosis present

## 2019-08-27 DIAGNOSIS — E10621 Type 1 diabetes mellitus with foot ulcer: Secondary | ICD-10-CM

## 2019-08-27 DIAGNOSIS — N183 Chronic kidney disease, stage 3 unspecified: Secondary | ICD-10-CM

## 2019-08-27 DIAGNOSIS — E11610 Type 2 diabetes mellitus with diabetic neuropathic arthropathy: Secondary | ICD-10-CM | POA: Diagnosis not present

## 2019-08-27 DIAGNOSIS — L97509 Non-pressure chronic ulcer of other part of unspecified foot with unspecified severity: Secondary | ICD-10-CM

## 2019-08-27 DIAGNOSIS — L97519 Non-pressure chronic ulcer of other part of right foot with unspecified severity: Secondary | ICD-10-CM | POA: Diagnosis not present

## 2019-08-27 DIAGNOSIS — D509 Iron deficiency anemia, unspecified: Secondary | ICD-10-CM | POA: Diagnosis present

## 2019-08-27 DIAGNOSIS — E1069 Type 1 diabetes mellitus with other specified complication: Secondary | ICD-10-CM | POA: Diagnosis present

## 2019-08-27 DIAGNOSIS — K219 Gastro-esophageal reflux disease without esophagitis: Secondary | ICD-10-CM | POA: Diagnosis present

## 2019-08-27 DIAGNOSIS — E10610 Type 1 diabetes mellitus with diabetic neuropathic arthropathy: Principal | ICD-10-CM | POA: Diagnosis present

## 2019-08-27 DIAGNOSIS — E785 Hyperlipidemia, unspecified: Secondary | ICD-10-CM | POA: Diagnosis present

## 2019-08-27 DIAGNOSIS — I129 Hypertensive chronic kidney disease with stage 1 through stage 4 chronic kidney disease, or unspecified chronic kidney disease: Secondary | ICD-10-CM | POA: Diagnosis present

## 2019-08-27 DIAGNOSIS — E1142 Type 2 diabetes mellitus with diabetic polyneuropathy: Secondary | ICD-10-CM | POA: Diagnosis not present

## 2019-08-27 DIAGNOSIS — Z7982 Long term (current) use of aspirin: Secondary | ICD-10-CM

## 2019-08-27 DIAGNOSIS — E11621 Type 2 diabetes mellitus with foot ulcer: Secondary | ICD-10-CM

## 2019-08-27 DIAGNOSIS — M79672 Pain in left foot: Secondary | ICD-10-CM

## 2019-08-27 DIAGNOSIS — N184 Chronic kidney disease, stage 4 (severe): Secondary | ICD-10-CM

## 2019-08-27 DIAGNOSIS — L089 Local infection of the skin and subcutaneous tissue, unspecified: Secondary | ICD-10-CM | POA: Diagnosis present

## 2019-08-27 DIAGNOSIS — Z79899 Other long term (current) drug therapy: Secondary | ICD-10-CM

## 2019-08-27 DIAGNOSIS — L97415 Non-pressure chronic ulcer of right heel and midfoot with muscle involvement without evidence of necrosis: Secondary | ICD-10-CM | POA: Diagnosis not present

## 2019-08-27 DIAGNOSIS — L97416 Non-pressure chronic ulcer of right heel and midfoot with bone involvement without evidence of necrosis: Secondary | ICD-10-CM | POA: Diagnosis present

## 2019-08-27 DIAGNOSIS — Z794 Long term (current) use of insulin: Secondary | ICD-10-CM

## 2019-08-27 DIAGNOSIS — R600 Localized edema: Secondary | ICD-10-CM | POA: Diagnosis present

## 2019-08-27 DIAGNOSIS — M79671 Pain in right foot: Secondary | ICD-10-CM

## 2019-08-27 LAB — COMPREHENSIVE METABOLIC PANEL
ALT: 12 U/L (ref 0–44)
AST: 13 U/L — ABNORMAL LOW (ref 15–41)
Albumin: 2.5 g/dL — ABNORMAL LOW (ref 3.5–5.0)
Alkaline Phosphatase: 97 U/L (ref 38–126)
Anion gap: 9 (ref 5–15)
BUN: 29 mg/dL — ABNORMAL HIGH (ref 6–20)
CO2: 23 mmol/L (ref 22–32)
Calcium: 8.7 mg/dL — ABNORMAL LOW (ref 8.9–10.3)
Chloride: 106 mmol/L (ref 98–111)
Creatinine, Ser: 1.72 mg/dL — ABNORMAL HIGH (ref 0.61–1.24)
GFR calc Af Amer: 55 mL/min — ABNORMAL LOW (ref >60)
GFR calc non Af Amer: 47 mL/min — ABNORMAL LOW (ref >60)
Glucose, Bld: 147 mg/dL — ABNORMAL HIGH (ref 70–99)
Potassium: 4.3 mmol/L (ref 3.5–5.1)
Sodium: 138 mmol/L (ref 135–145)
Total Bilirubin: 0.6 mg/dL (ref 0.3–1.2)
Total Protein: 7.2 g/dL (ref 6.5–8.1)

## 2019-08-27 LAB — LACTIC ACID, PLASMA: Lactic Acid, Venous: 0.9 mmol/L (ref 0.5–1.9)

## 2019-08-27 LAB — CBC WITH DIFFERENTIAL/PLATELET
Abs Immature Granulocytes: 0.06 10*3/uL (ref 0.00–0.07)
Basophils Absolute: 0.1 10*3/uL (ref 0.0–0.1)
Basophils Relative: 0 %
Eosinophils Absolute: 0.3 10*3/uL (ref 0.0–0.5)
Eosinophils Relative: 2 %
HCT: 27.3 % — ABNORMAL LOW (ref 39.0–52.0)
Hemoglobin: 8.2 g/dL — ABNORMAL LOW (ref 13.0–17.0)
Immature Granulocytes: 0 %
Lymphocytes Relative: 11 %
Lymphs Abs: 1.4 10*3/uL (ref 0.7–4.0)
MCH: 25.2 pg — ABNORMAL LOW (ref 26.0–34.0)
MCHC: 30 g/dL (ref 30.0–36.0)
MCV: 84 fL (ref 80.0–100.0)
Monocytes Absolute: 1.3 10*3/uL — ABNORMAL HIGH (ref 0.1–1.0)
Monocytes Relative: 9 %
Neutro Abs: 10.4 10*3/uL — ABNORMAL HIGH (ref 1.7–7.7)
Neutrophils Relative %: 78 %
Platelets: 475 10*3/uL — ABNORMAL HIGH (ref 150–400)
RBC: 3.25 MIL/uL — ABNORMAL LOW (ref 4.22–5.81)
RDW: 14.3 % (ref 11.5–15.5)
WBC: 13.4 10*3/uL — ABNORMAL HIGH (ref 4.0–10.5)
nRBC: 0 % (ref 0.0–0.2)

## 2019-08-27 LAB — GLUCOSE, CAPILLARY: Glucose-Capillary: 135 mg/dL — ABNORMAL HIGH (ref 70–99)

## 2019-08-27 LAB — SEDIMENTATION RATE: Sed Rate: 126 mm/hr — ABNORMAL HIGH (ref 0–16)

## 2019-08-27 MED ORDER — ACETAMINOPHEN 325 MG PO TABS: 650.0000 mg | ORAL_TABLET | Freq: Four times a day (QID) | ORAL | Status: DC | PRN

## 2019-08-27 MED ORDER — METOPROLOL SUCCINATE ER 50 MG PO TB24
50.0000 mg | ORAL_TABLET | Freq: Every day | ORAL | Status: DC
  Administered 2019-08-27 – 2019-09-01 (×6): 50 mg via ORAL
  Filled 2019-08-27 (×6): qty 1

## 2019-08-27 MED ORDER — VANCOMYCIN HCL IN DEXTROSE 1-5 GM/200ML-% IV SOLN
1000.0000 mg | Freq: Once | INTRAVENOUS | Status: AC
  Administered 2019-08-27: 1000 mg via INTRAVENOUS
  Filled 2019-08-27: qty 200

## 2019-08-27 MED ORDER — SODIUM CHLORIDE 0.9 % IV SOLN: INTRAVENOUS | Status: AC

## 2019-08-27 MED ORDER — INSULIN ASPART 100 UNIT/ML ~~LOC~~ SOLN
0.0000 [IU] | SUBCUTANEOUS | Status: DC
  Administered 2019-08-28: 8 [IU] via SUBCUTANEOUS
  Administered 2019-08-28: 3 [IU] via SUBCUTANEOUS
  Administered 2019-08-28: 2 [IU] via SUBCUTANEOUS
  Administered 2019-08-28: 5 [IU] via SUBCUTANEOUS
  Administered 2019-08-28 (×2): 3 [IU] via SUBCUTANEOUS
  Administered 2019-08-29: 15 [IU] via SUBCUTANEOUS
  Administered 2019-08-29: 8 [IU] via SUBCUTANEOUS

## 2019-08-27 MED ORDER — GADOBUTROL 1 MMOL/ML IV SOLN
10.0000 mL | Freq: Once | INTRAVENOUS | Status: AC | PRN
  Administered 2019-08-27: 10 mL via INTRAVENOUS

## 2019-08-27 MED ORDER — SENNOSIDES-DOCUSATE SODIUM 8.6-50 MG PO TABS: 2.0000 | ORAL_TABLET | Freq: Every day | ORAL | Status: DC | PRN

## 2019-08-27 MED ORDER — VITAMIN D (ERGOCALCIFEROL) 1.25 MG (50000 UNIT) PO CAPS
50000.0000 [IU] | ORAL_CAPSULE | ORAL | Status: DC
  Administered 2019-08-31: 50000 [IU] via ORAL
  Filled 2019-08-27: qty 1

## 2019-08-27 MED ORDER — VANCOMYCIN HCL 500 MG/100ML IV SOLN
500.0000 mg | Freq: Once | INTRAVENOUS | Status: AC
  Administered 2019-08-27: 500 mg via INTRAVENOUS
  Filled 2019-08-27: qty 100

## 2019-08-27 MED ORDER — EZETIMIBE 10 MG PO TABS
10.0000 mg | ORAL_TABLET | Freq: Every day | ORAL | Status: DC
  Administered 2019-08-28 – 2019-09-01 (×5): 10 mg via ORAL
  Filled 2019-08-27 (×5): qty 1

## 2019-08-27 MED ORDER — ACETAMINOPHEN 650 MG RE SUPP: 650.0000 mg | Freq: Four times a day (QID) | RECTAL | Status: DC | PRN

## 2019-08-27 MED ORDER — VANCOMYCIN HCL IN DEXTROSE 1-5 GM/200ML-% IV SOLN
1000.0000 mg | INTRAVENOUS | Status: DC
  Administered 2019-08-28 – 2019-08-31 (×4): 1000 mg via INTRAVENOUS
  Filled 2019-08-27 (×4): qty 200

## 2019-08-27 MED ORDER — FERROUS SULFATE 325 (65 FE) MG PO TABS
325.0000 mg | ORAL_TABLET | Freq: Every day | ORAL | Status: DC
  Administered 2019-08-28 – 2019-09-01 (×5): 325 mg via ORAL
  Filled 2019-08-27 (×5): qty 1

## 2019-08-27 MED ORDER — ATORVASTATIN CALCIUM 40 MG PO TABS
40.0000 mg | ORAL_TABLET | Freq: Every day | ORAL | Status: DC
  Administered 2019-08-29 – 2019-09-01 (×4): 40 mg via ORAL
  Filled 2019-08-27 (×5): qty 1

## 2019-08-27 MED ORDER — LEVOFLOXACIN IN D5W 750 MG/150ML IV SOLN
750.0000 mg | Freq: Once | INTRAVENOUS | Status: AC
  Administered 2019-08-27: 750 mg via INTRAVENOUS
  Filled 2019-08-27: qty 150

## 2019-08-27 MED ORDER — AMLODIPINE BESYLATE 5 MG PO TABS
5.0000 mg | ORAL_TABLET | Freq: Every day | ORAL | Status: DC
  Administered 2019-08-27 – 2019-09-01 (×6): 5 mg via ORAL
  Filled 2019-08-27 (×6): qty 1

## 2019-08-27 MED ORDER — ENSURE ENLIVE PO LIQD
237.0000 mL | Freq: Two times a day (BID) | ORAL | Status: DC
  Administered 2019-08-29 (×2): 237 mL via ORAL

## 2019-08-27 MED ORDER — SODIUM CHLORIDE 0.9% FLUSH: 3.0000 mL | Freq: Once | INTRAVENOUS | Status: DC

## 2019-08-27 MED ORDER — PANTOPRAZOLE SODIUM 40 MG PO TBEC
40.0000 mg | DELAYED_RELEASE_TABLET | Freq: Every day | ORAL | Status: DC
  Administered 2019-08-27 – 2019-09-01 (×6): 40 mg via ORAL
  Filled 2019-08-27 (×6): qty 1

## 2019-08-27 MED ORDER — VANCOMYCIN HCL 500 MG/100ML IV SOLN
500.0000 mg | Freq: Once | INTRAVENOUS | Status: DC
  Filled 2019-08-27: qty 100

## 2019-08-27 MED ORDER — INSULIN PUMP
SUBCUTANEOUS | Status: DC
  Filled 2019-08-27: qty 1

## 2019-08-27 MED ORDER — LEVOFLOXACIN IN D5W 750 MG/150ML IV SOLN
750.0000 mg | INTRAVENOUS | Status: DC
  Administered 2019-08-28 – 2019-09-01 (×4): 750 mg via INTRAVENOUS
  Filled 2019-08-27 (×5): qty 150

## 2019-08-27 NOTE — ED Provider Notes (Signed)
[facility] DEPT Provider Note   CSN: [id number] Arrival date & time: 08/27/19  1625     History Chief Complaint  Patient presents with  . infected foot    right    Javier Hunt is a 44 y.o. male.  Javier Hunt is a 44 year old gentleman who presents to the ED with a worsening right foot wound.  He has a previous medical history significant for type 1 diabetes and recent hospitalization for anemia and right foot wound.  Following his recent hospital discharge, he completed a course of Augmentin for 10 days in the outpatient setting.  He reports that he had been monitoring of the top and sides of his feet and had generally been feeling well.  He decided to be seen by podiatry today because he noticed that he had a spot developing on the side of his right foot.  On presenting to podiatry today, they noted a terrible diabetic foot ulcer on the plantar surface of his right heel.  The heel was debrided by podiatry and he was told to present to the ED for further evaluation because he will likely need admission with IV antibiotics and likely surgery.  His right foot has been giving him trouble since about January of this past year.  Since that time, he is had significant swelling on his right leg.  Per his report, he has had multiple Dopplers which indicated no evidence of DVT and he has patent vasculature.  He denies any current chest pain or shortness of breath.        Past Medical History:  Diagnosis Date  . Diabetes ([place])   . Kidney disease     Patient Active Problem List   Diagnosis Date Noted  . Peripheral edema   . SOB (shortness of breath)   . Symptomatic anemia 08/11/2019    Past Surgical History:  Procedure Laterality Date  . APPENDECTOMY         Family History  Problem Relation Age of Onset  . Heart failure Mother   . Stroke Mother   . Cancer Mother   . Cancer Father   . Heart failure Father     Social History   Tobacco Use   . Smoking status: Never Smoker  . Smokeless tobacco: Never Used  Substance Use Topics  . Alcohol use: No  . Drug use: Never    Home Medications Prior to Admission medications   Medication Sig Start Date End Date Taking? Authorizing Provider  amLODipine (NORVASC) 5 MG tablet Take 5 mg by mouth daily.    [provider]  amoxicillin-clavulanate (AUGMENTIN) 500-125 MG tablet Take 1 tablet by mouth 2 (two) times daily. 08/11/19   [provider]  aspirin 81 MG tablet Take 81 mg by mouth daily.    [provider]  atorvastatin (LIPITOR) 40 MG tablet Take 40 mg by mouth daily at 6 PM.  08/11/17   [provider]  CONTOUR NEXT TEST test strip USE 8 TIMES DAILY AND AS DIRECTED 05/16/17   [provider]  ezetimibe (ZETIA) 10 MG tablet Take 10 mg by mouth daily. 07/14/19   [provider]  ferrous sulfate 325 (65 FE) MG tablet Take 1 tablet (325 mg total) by mouth 2 (two) times daily with a meal. 08/12/19   Georgette Shell, MD  Insulin Human (INSULIN PUMP) SOLN Inject into the skin. Novolog. Base: 25.45 units per day.  1 unit per 10 grams of carbohydrates.  [provider]  metoprolol succinate (TOPROL-XL) 50 MG 24 hr tablet Take 50 mg by mouth daily. Take with or immediately following a meal.    [provider]  olmesartan (BENICAR) 5 MG tablet Take 10 mg by mouth daily. 07/14/19   [provider]  omeprazole (PRILOSEC) 40 MG capsule Take 40 mg by mouth daily. 06/22/17   [provider]  senna-docusate (SENOKOT-S) 8.6-50 MG tablet Take 2 tablets by mouth daily as needed for mild constipation. 08/12/19 08/11/20  Georgette Shell, MD  tadalafil (CIALIS) 20 MG tablet Take 20 mg by mouth daily as needed for erectile dysfunction.  05/15/17   [provider]  Vitamin D, Ergocalciferol, (DRISDOL) 50000 UNITS CAPS capsule Take 50,000 Units by mouth 2 (two) times a week. Monday and Wednesday    [provider]    Allergies    Lisinopril and Keflex [cephalexin]  Review of Systems   Review of Systems  Constitutional: Positive for fever (not noticed unti presentation to the ED).  HENT: Negative for congestion.   Respiratory: Negative for cough, chest tightness, shortness of breath and wheezing.   Cardiovascular: Positive for leg swelling. Negative for chest pain and palpitations.  Gastrointestinal: Positive for constipation. Negative for abdominal pain, blood in stool, diarrhea, nausea and vomiting.  Endocrine: Negative for polyuria.  Genitourinary: Negative for dysuria.  Musculoskeletal: Negative for arthralgias.  Neurological: Negative for headaches.    Physical Exam Updated Vital Signs BP (!) 146/85   Pulse 88   Temp (!) 100.4 F (38 C) (Oral)   Resp 16   SpO2 95%   Physical Exam Constitutional:      Appearance: Normal appearance. He is normal weight.  HENT:     Head: Normocephalic and atraumatic.     Nose: Nose normal.  Eyes:     Extraocular Movements: Extraocular movements intact.     Conjunctiva/sclera: Conjunctivae normal.     Pupils: Pupils are equal, round, and reactive to light.  Cardiovascular:     Rate and Rhythm: Normal rate and regular rhythm.     Heart sounds: No murmur.  Pulmonary:     Effort: Pulmonary effort is normal. No respiratory distress.     Breath sounds: Normal breath sounds. No wheezing or rales.  Abdominal:     General: Abdomen is flat. Bowel sounds are normal. There is no distension.     Palpations: Abdomen is soft.     Tenderness: There is no abdominal tenderness. There is no guarding.  Musculoskeletal:        General: Swelling and deformity present.     Cervical back: Normal range of motion and neck supple.     Right lower leg: Edema present.  Skin:    General: Skin is warm.     Findings: Lesion present.  Neurological:     General: No focal deficit present.     Mental Status: He is alert and oriented to person, place, and  time. Mental status is at baseline.  Psychiatric:        Mood and Affect: Mood normal.        Behavior: Behavior normal.     ED Results / Procedures / Treatments   Labs (all labs ordered are listed, but only abnormal results are displayed) Labs Reviewed  COMPREHENSIVE METABOLIC PANEL - Abnormal; Notable for the following components:      Result Value   Glucose, Bld 147 (*)    BUN 29 (*)    Creatinine, Ser 1.72 (*)  Calcium 8.7 (*)    Albumin 2.5 (*)    AST 13 (*)    GFR calc non Af Amer 47 (*)    GFR calc Af Amer 55 (*)    All other components within normal limits  CBC WITH DIFFERENTIAL/PLATELET - Abnormal; Notable for the following components:   WBC 13.4 (*)    RBC 3.25 (*)    Hemoglobin 8.2 (*)    HCT 27.3 (*)    MCH 25.2 (*)    Platelets 475 (*)    Neutro Abs 10.4 (*)    Monocytes Absolute 1.3 (*)    All other components within normal limits  CULTURE, BLOOD (ROUTINE X 2)  CULTURE, BLOOD (ROUTINE X 2)  SARS CORONAVIRUS 2 (TAT 6-24 HRS)  LACTIC ACID, PLASMA  LACTIC ACID, PLASMA  SEDIMENTATION RATE  C-REACTIVE PROTEIN    EKG None  Radiology DG Ankle 2 Views Right  Result Date: 08/27/2019 Please see detailed radiograph report in office note.  DG Foot Complete Right  Result Date: 08/27/2019 Please see detailed radiograph report in office note.   Procedures Procedures (including critical care time)  Medications Ordered in ED Medications  sodium chloride flush (NS) 0.9 % injection 3 mL (has no administration in time range)  vancomycin (VANCOCIN) IVPB 1000 mg/200 mL premix (has no administration in time range)  levofloxacin (LEVAQUIN) IVPB 750 mg (has no administration in time range)    ED Course  I have reviewed the triage vital signs and the nursing notes.  Pertinent labs & imaging results that were available during my care of the patient were reviewed by me and considered in my medical decision making (see chart for details).    MDM  Rules/Calculators/A&P                      Javier Hunt is a 44 year old man presenting to the ED with a severe diabetic foot ulcer on the right foot.  He is febrile today with stable blood pressure and heart rate.  There is concern for osteomyelitis.  Basic labs and blood cultures taken.  Foot x-rays have already been performed today.  Discussed with podiatry, plan to admit to [facility] for debridement at least.  We will follow-up basic labs and plan for admission.  Started on empiric antibiotics.  Initial labs notable for mildly elevated WBC to 13.4.  Lactate normal.  Spoke with hospitalist for admission.   Final Clinical Impression(s) / ED Diagnoses Final diagnoses:  None    Rx / DC Orders ED Discharge Orders    None       Matilde Haymaker, MD 08/27/19 Gearlean Alf, MD 08/31/19 [phone]

## 2019-08-27 NOTE — Progress Notes (Signed)
A consult was received from an ED physician for vancomycin and levaquin per pharmacy dosing.  The patient's profile has been reviewed for ht/wt/allergies/indication/available labs.   A one time order has been placed for vancomycin 1gm and levaquin 750mg .    Further antibiotics/pharmacy consults should be ordered by admitting physician if indicated.                       Thank you, Dolly Rias RPh 08/27/2019, 6:48 PM

## 2019-08-27 NOTE — Progress Notes (Signed)
Right lower extremity venous duplex has been completed. Preliminary results can be found in CV Proc through chart review.  Results were given to [facility].  08/27/19 7:20 PM Carlos Levering RVT

## 2019-08-27 NOTE — ED Notes (Signed)
VAS at bedside.

## 2019-08-27 NOTE — ED Notes (Signed)
Pt transported to MR 

## 2019-08-27 NOTE — Progress Notes (Signed)
  Subjective:  Patient ID: Javier Hunt, male    DOB: 1975-08-24,  MRN: [id number]  Chief Complaint  Patient presents with  . Foot Pain    pt is here for bil foot/ ankle pain, pt states that the foot and ankle wound of the right foot/ ankle pt states that the right foot is elevated to the touch. Pt states that it is more of a pressure sensation than actual pain.    44 y.o. male presents for wound care. Hx confirmed with patient.  Objective:  Physical Exam: Wound Location: right heel Wound Measurement: 15x5, 4x5 central boggy area Wound Base: Necrotic eschar Peri-wound: Reddened, Macerated Odor Exudate: Scant/small amount Serosanguinous exudate + induration, + fluctuance and + tenderness Central boggy area  Temp 101.5, BP 159/94, Pulse 87       Radiographs:  X-ray of the right foot: hindfoot charcot changes, TN dislocation. No gas noted. Calcaneus oriented plantarly Assessment:   1. Charcot ankle, right   2. Diabetic ulcer of right heel associated with type 2 diabetes mellitus, with necrosis of muscle ([place])   3. DM type 2 with diabetic peripheral neuropathy ([facility])   4. Charcot's joint of right foot    Plan:  Patient was evaluated and treated and all questions answered.  Ulcer R Heel, 2/2 Charcot -Educated on etiology -XR reviewed with patient -Dressing applied consisting of betadine, sterile gauze, kerlix and ACE bandage -Offload ulcer with CAM boot -CAM boot dispensed -Discussed need for NWB. Will get crutches at the hospital. -Wound cleansed and debrided -Febrile today. Concern for deep infection given boggy heel. Would benefit from admission for IV abx, MRI right heel/ankle. Likely debridement -Discussed with patient he is high risk for lower limb amputation, but we could try a possible salvage approach.  -Will follow on admission. Please consult Dr. March Rummage upon admission.  Procedure: Excisional Debridement of Wound Rationale: Removal of non-viable soft tissue from  the wound to promote healing.  Anesthesia: none Pre-Debridement Wound Measurements: intact bulla  Post-Debridement Wound Measurements: 15 cm x 5 cm x 0.2 cm  Type of Debridement: Sharp Excisional Tissue Removed: Non-viable soft tissue Depth of Debridement: subcutaneous tissue. Technique: Sharp excisional debridement to bleeding, viable wound base.  Dressing: Dry, sterile, compression dressing. Disposition: Patient tolerated procedure well. Patient to return in 1 week for follow-up.

## 2019-08-27 NOTE — Progress Notes (Signed)
Pharmacy Antibiotic Note  Javier Hunt is a 44 y.o. male admitted on 08/27/2019 with worsening right foot wound.  Pharmacy has been consulted for vancomycin and Levaquin dosing.  Plan: Levaquin 750 mg iv q 24 hours  Vancomycin 1000 + 500 mg load followed by vancomycin 1000 mg IV Q 24 hrs. Goal AUC 400-550. Expected AUC: 487 SCr used: 1.72  F/U renal function, culture results, plans for antibiotics      Temp (24hrs), Avg:100.2 F (37.9 C), Min:98.7 F (37.1 C), Max:101.5 F (38.6 C)  Recent Labs  Lab 08/27/19 1731  WBC 13.4*  CREATININE 1.72*  LATICACIDVEN 0.9    CrCl cannot be calculated (Unknown ideal weight.).    Using previous weight 90.3 kg Ht: 66 inches  Allergies  Allergen Reactions  . Lisinopril Cough  . Keflex [Cephalexin] Rash    Antimicrobials this admission: 3/25 vancomycin >>  3/25 Levaquin >>   Dose adjustments this admission:  Microbiology results: 3/25 BCx: sent 3/25 SARS-CoV-2: sent   Thank you for allowing pharmacy to be a part of this patient's care.  [name] 08/27/2019 8:45 PM

## 2019-08-27 NOTE — H&P (Signed)
History and Physical    Javier Hunt:[id number] DOB: 19-Nov-1975 DOA: 08/27/2019  PCP: [name], MD Patient coming from: Podiatry office  Chief Complaint: Foot infection  HPI: Javier Hunt is a 44 y.o. male with medical history significant of hypertension, hyperlipidemia, type 1 diabetes, GERD, CKD stage III, recent hospitalization for anemia and right foot wound presenting to the ED from his podiatrist's office for evaluation of worsening foot infection.  Patient states he has had a foot wound since January.  He was told that he has some spots at the side of his foot.  He was not aware that his foot infection has now gotten much worse.  States he has type 1 diabetes and uses an insulin pump.  States his right leg has been swollen for a while and he has had test done on several occasions and been told he did not have blood clots in his leg.  Denies fevers or chills.  Denies cough, shortness of breath, nausea, vomiting, or abdominal pain.  No other complaints.  ED Course: Temperature 100.4 F. Not tachycardic or hypotensive.  WBC count 13.4.  Lactic acid normal.  Hemoglobin 8.2, stable compared to recent labs.  Platelet count 475, mildly elevated on recent labs as well.  Blood glucose 147.  BUN 29, creatinine 1.7.  Renal function stable compared to labs done 2 weeks ago, no prior labs for comparison.  ESR and CRP pending.  SARS-CoV-2 PCR test pending.  Blood culture x2 pending. X-rays of right foot and ankle were done at podiatry office earlier today.  Interpretation per podiatrist: "X-ray of the right foot: hindfoot charcot changes, TN dislocation. No gas noted. Calcaneus oriented plantarly"  Podiatry is aware that the patient is here at [facility] long and will consult in a.m. ED provider spoke to podiatry.  Patient noted to have edema of his right lower extremity.  Doppler was done in the ED and negative for DVT.  Patient received vancomycin and Levaquin.  Review of Systems:  All systems  reviewed and apart from history of presenting illness, are negative.  Past Medical History:  Diagnosis Date  . Diabetes ([place])   . Kidney disease     Past Surgical History:  Procedure Laterality Date  . APPENDECTOMY       reports that he has never smoked. He has never used smokeless tobacco. He reports that he does not drink alcohol or use drugs.  Allergies  Allergen Reactions  . Lisinopril Cough  . Keflex [Cephalexin] Rash    Family History  Problem Relation Age of Onset  . Heart failure Mother   . Stroke Mother   . Cancer Mother   . Cancer Father   . Heart failure Father     Prior to Admission medications   Medication Sig Start Date End Date Taking? Authorizing Provider  amLODipine (NORVASC) 5 MG tablet Take 5 mg by mouth daily.   Yes [provider]  aspirin 81 MG tablet Take 81 mg by mouth daily.   Yes [provider]  atorvastatin (LIPITOR) 40 MG tablet Take 40 mg by mouth daily at 6 PM.  08/11/17  Yes [provider]  ezetimibe (ZETIA) 10 MG tablet Take 10 mg by mouth daily. 07/14/19  Yes [provider]  ferrous sulfate 325 (65 FE) MG tablet Take 1 tablet (325 mg total) by mouth 2 (two) times daily with a meal. Patient taking differently: Take 325 mg by mouth daily with breakfast.  08/12/19  Yes Georgette Shell,  MD  Insulin Human (INSULIN PUMP) SOLN Inject into the skin. Novolog. Base: 25.45 units per day.  1 unit per 10 grams of carbohydrates.   Yes [provider]  metoprolol succinate (TOPROL-XL) 50 MG 24 hr tablet Take 50 mg by mouth daily. Take with or immediately following a meal.   Yes [provider]  olmesartan (BENICAR) 5 MG tablet Take 10 mg by mouth daily. 07/14/19  Yes [provider]  omeprazole (PRILOSEC) 40 MG capsule Take 40 mg by mouth daily. 06/22/17  Yes [provider]  senna-docusate (SENOKOT-S) 8.6-50 MG tablet Take 2 tablets by mouth daily as needed for mild constipation.  08/12/19 08/11/20 Yes Georgette Shell, MD  tadalafil (CIALIS) 20 MG tablet Take 20 mg by mouth daily as needed for erectile dysfunction.  05/15/17  Yes [provider]  Vitamin D, Ergocalciferol, (DRISDOL) 50000 UNITS CAPS capsule Take 50,000 Units by mouth 2 (two) times a week. Monday and Wednesday   Yes [provider]  CONTOUR NEXT TEST test strip USE 8 TIMES DAILY AND AS DIRECTED 05/16/17   [provider]    Physical Exam: Vitals:   08/27/19 2000 08/27/19 2015 08/27/19 2024 08/27/19 2049  BP: (!) 160/83 (!) 163/86    Pulse: 81 81    Resp: 16 18    Temp:   98.7 F (37.1 C)   TempSrc:   Oral   SpO2: 97% 98%    Weight:    88.9 kg  Height:    _0  (1.676 m)    Physical Exam  Constitutional: He is oriented to person, place, and time. He appears well-developed and well-nourished. No distress.  HENT:  Head: Normocephalic.  Eyes: Right eye exhibits no discharge. Left eye exhibits no discharge.  Cardiovascular: Normal rate, regular rhythm and intact distal pulses.  Pulmonary/Chest: Effort normal and breath sounds normal. No respiratory distress. He has no wheezes. He has no rales.  Abdominal: Soft. Bowel sounds are normal. He exhibits no distension. There is no abdominal tenderness. There is no guarding.  Musculoskeletal:        General: Edema present.     Cervical back: Neck supple.     Comments: Right lower extremity appears erythematous and erythematous Right foot in bandage.  Please see images.  Neurological: He is alert and oriented to person, place, and time.  Skin: Skin is warm and dry. He is not diaphoretic.               Labs on Admission: I have personally reviewed following labs and imaging studies  CBC: Recent Labs  Lab 08/27/19 1731  WBC 13.4*  NEUTROABS 10.4*  HGB 8.2*  HCT 27.3*  MCV 84.0  PLT 621*   Basic Metabolic Panel: Recent Labs  Lab 08/27/19 1731  NA 138  K 4.3  CL 106  CO2 23  GLUCOSE 147*  BUN 29*   CREATININE 1.72*  CALCIUM 8.7*   GFR: Estimated Creatinine Clearance: 57.2 mL/min (A) (by C-G formula based on SCr of 1.72 mg/dL (H)). Liver Function Tests: Recent Labs  Lab 08/27/19 1731  AST 13*  ALT 12  ALKPHOS 97  BILITOT 0.6  PROT 7.2  ALBUMIN 2.5*   No results for input(s): LIPASE, AMYLASE in the last 168 hours. No results for input(s): AMMONIA in the last 168 hours. Coagulation Profile: No results for input(s): INR, PROTIME in the last 168 hours. Cardiac Enzymes: No results for input(s): CKTOTAL, CKMB, CKMBINDEX, TROPONINI in the last 168 hours.  BNP (last 3 results) No results for input(s): PROBNP in the last 8760 hours. HbA1C: No results for input(s): HGBA1C in the last 72 hours. CBG: No results for input(s): GLUCAP in the last 168 hours. Lipid Profile: No results for input(s): CHOL, HDL, LDLCALC, TRIG, CHOLHDL, LDLDIRECT in the last 72 hours. Thyroid Function Tests: No results for input(s): TSH, T4TOTAL, FREET4, T3FREE, THYROIDAB in the last 72 hours. Anemia Panel: No results for input(s): VITAMINB12, FOLATE, FERRITIN, TIBC, IRON, RETICCTPCT in the last 72 hours. Urine analysis:    Component Value Date/Time   COLORURINE STRAW (A) 08/11/2019 2030   APPEARANCEUR CLEAR 08/11/2019 2030   LABSPEC 1.008 08/11/2019 2030   PHURINE 5.0 08/11/2019 2030   GLUCOSEU 150 (A) 08/11/2019 2030   HGBUR SMALL (A) 08/11/2019 2030   BILIRUBINUR NEGATIVE 08/11/2019 2030   [place] 08/11/2019 2030   PROTEINUR 100 (A) 08/11/2019 2030   NITRITE NEGATIVE 08/11/2019 2030   LEUKOCYTESUR NEGATIVE 08/11/2019 2030    Radiological Exams on Admission: DG Ankle 2 Views Right  Result Date: 08/27/2019 Please see detailed radiograph report in office note.  DG Foot Complete Right  Result Date: 08/27/2019 Please see detailed radiograph report in office note.  VAS [place] LOWER EXTREMITY VENOUS (DVT) (ONLY [facility] & [facility] 7a-7p)  Result Date: 08/27/2019  Lower Venous DVTStudy  Indications: Edema.  Risk Factors: None identified. Limitations: Poor ultrasound/tissue interface. Comparison Study: No prior studies. Performing Technologist: Oliver Hum RVT  Examination Guidelines: A complete evaluation includes B-mode imaging, spectral Doppler, color Doppler, and power Doppler as needed of all accessible portions of each vessel. Bilateral testing is considered an integral part of a complete examination. Limited examinations for reoccurring indications may be performed as noted. The reflux portion of the exam is performed with the patient in reverse Trendelenburg.  +---------+---------------+---------+-----------+----------+--------------+ RIGHT    CompressibilityPhasicitySpontaneityPropertiesThrombus Aging +---------+---------------+---------+-----------+----------+--------------+ CFV      Full           Yes      Yes                                 +---------+---------------+---------+-----------+----------+--------------+ SFJ      Full                                                        +---------+---------------+---------+-----------+----------+--------------+ FV Prox  Full                                                        +---------+---------------+---------+-----------+----------+--------------+ FV Mid   Full                                                        +---------+---------------+---------+-----------+----------+--------------+ FV DistalFull                                                        +---------+---------------+---------+-----------+----------+--------------+  PFV      Full                                                        +---------+---------------+---------+-----------+----------+--------------+ POP      Full           Yes      Yes                                 +---------+---------------+---------+-----------+----------+--------------+ PTV      Full                                                         +---------+---------------+---------+-----------+----------+--------------+ PERO     Full                                                        +---------+---------------+---------+-----------+----------+--------------+ Multiphasic flow was noted in the popliteal, posterior tibial, peroneal, and anterior tibial arteries.  +----+---------------+---------+-----------+----------+--------------+ LEFTCompressibilityPhasicitySpontaneityPropertiesThrombus Aging +----+---------------+---------+-----------+----------+--------------+ CFV Full           Yes      Yes                                 +----+---------------+---------+-----------+----------+--------------+     Summary: RIGHT: - There is no evidence of deep vein thrombosis in the lower extremity.  - No cystic structure found in the popliteal fossa. - Multiphasic flow was noted in the popliteal, posterior tibial, peroneal, and anterior tibial arteries.  LEFT: - No evidence of common femoral vein obstruction.  *See table(s) above for measurements and observations.    Preliminary     Assessment/Plan Principal Problem:   Diabetic foot ulcer ([place]) Active Problems:   Charcot foot due to diabetes mellitus ([place])   Edema of right lower extremity   Anemia   CKD (chronic kidney disease), stage III   Severe diabetic ulcer of the right heel secondary to Charcot foot with concern for possible osteomyelitis: Febrile and has mild leukocytosis.  Not tachycardic or hypotensive.  Lactic acid normal.  ESR and CRP pending.  X-ray of the foot was done at podiatry office and wound was cleaned and debrided.  Patient was sent to the hospital due to concern for deep infection.  Plan is to continue IV antibiotics, obtain MRI of right heel/ankle, and wound care consult.  Blood culture x2 pending.  Patient will need surgical debridement and is a high risk for lower limb amputation.  Podiatry is aware that he is here and will consult in  a.m.  Right lower extremity edema: Venous Doppler negative for DVT.  Anemia: Based on work-up done during recent hospitalization, anemia is due to combination of iron deficiency and chronic disease/CKD.  He was given 2 units of blood during his recent hospitalization.  His FOBT was negative at that time.  Hemoglobin currently 8.2, stable compared to recent labs.  Continue home iron supplement.  CKD stage III: Creatinine 1.7, stable compared to recent labs.  No prior labs for comparison.  Continue to monitor renal function during this hospitalization and ensure outpatient nephrology follow-up.  Type 1 diabetes: A1c 6.9 on 08/12/2019.  Patient has an insulin pump, continue.  Hypertension: Blood pressure slightly elevated.  Continue home amlodipine, metoprolol  Hyperlipidemia: Continue home Lipitor, Zetia  GERD: Continue PPI  DVT prophylaxis: SCDs Code Status: Full code Family Communication: No family available at this time. Disposition Plan: Anticipate discharge after clinical improvement.  Will likely need surgery for debridement. Consults called: Podiatry Admission status: It is my clinical opinion that admission to INPATIENT is reasonable and necessary because of the expectation that this patient will require hospital care that crosses at least 2 midnights to treat this condition based on the medical complexity of the problems presented.  Given the aforementioned information, the predictability of an adverse outcome is felt to be significant.  The medical decision making on this patient was of high complexity and the patient is at high risk for clinical deterioration, therefore this is a level 3 visit.  Shela Leff MD [facility]  If 7PM-7AM, please contact night-coverage [email or web address]  08/27/2019, 9:03 PM

## 2019-08-27 NOTE — ED Triage Notes (Signed)
Pt sent for right foot infection. Reports finished antibiotics on Sunday.

## 2019-08-27 NOTE — Plan of Care (Signed)
Per pt's RN, pt's insulin pump not working properly. So I ordered SSI and d/cd pump orders. Please eval pump in AM. thanks

## 2019-08-27 NOTE — ED Notes (Signed)
Attempted to call report with no answer

## 2019-08-28 ENCOUNTER — Inpatient Hospital Stay (HOSPITAL_COMMUNITY): Payer: BC Managed Care – PPO | Admitting: Anesthesiology

## 2019-08-28 ENCOUNTER — Encounter (HOSPITAL_COMMUNITY): Payer: Self-pay | Admitting: Internal Medicine

## 2019-08-28 ENCOUNTER — Encounter (HOSPITAL_COMMUNITY)
Admission: EM | Disposition: A | Discharge status: Home Health | Payer: Self-pay | Source: Home / Self Care | Attending: Internal Medicine

## 2019-08-28 DIAGNOSIS — L97415 Non-pressure chronic ulcer of right heel and midfoot with muscle involvement without evidence of necrosis: Secondary | ICD-10-CM

## 2019-08-28 DIAGNOSIS — M86171 Other acute osteomyelitis, right ankle and foot: Secondary | ICD-10-CM

## 2019-08-28 DIAGNOSIS — E11610 Type 2 diabetes mellitus with diabetic neuropathic arthropathy: Secondary | ICD-10-CM

## 2019-08-28 DIAGNOSIS — E10621 Type 1 diabetes mellitus with foot ulcer: Secondary | ICD-10-CM

## 2019-08-28 HISTORY — PX: IRRIGATION AND DEBRIDEMENT FOOT: SHX6602

## 2019-08-28 LAB — CBC
HCT: 25.9 % — ABNORMAL LOW (ref 39.0–52.0)
Hemoglobin: 7.7 g/dL — ABNORMAL LOW (ref 13.0–17.0)
MCH: 24.8 pg — ABNORMAL LOW (ref 26.0–34.0)
MCHC: 29.7 g/dL — ABNORMAL LOW (ref 30.0–36.0)
MCV: 83.3 fL (ref 80.0–100.0)
Platelets: 433 10*3/uL — ABNORMAL HIGH (ref 150–400)
RBC: 3.11 MIL/uL — ABNORMAL LOW (ref 4.22–5.81)
RDW: 14.3 % (ref 11.5–15.5)
WBC: 11.1 10*3/uL — ABNORMAL HIGH (ref 4.0–10.5)
nRBC: 0 % (ref 0.0–0.2)

## 2019-08-28 LAB — BASIC METABOLIC PANEL
Anion gap: 10 (ref 5–15)
BUN: 27 mg/dL — ABNORMAL HIGH (ref 6–20)
CO2: 21 mmol/L — ABNORMAL LOW (ref 22–32)
Calcium: 8.7 mg/dL — ABNORMAL LOW (ref 8.9–10.3)
Chloride: 105 mmol/L (ref 98–111)
Creatinine, Ser: 1.6 mg/dL — ABNORMAL HIGH (ref 0.61–1.24)
GFR calc Af Amer: 60 mL/min — ABNORMAL LOW (ref >60)
GFR calc non Af Amer: 52 mL/min — ABNORMAL LOW (ref >60)
Glucose, Bld: 221 mg/dL — ABNORMAL HIGH (ref 70–99)
Potassium: 4.4 mmol/L (ref 3.5–5.1)
Sodium: 136 mmol/L (ref 135–145)

## 2019-08-28 LAB — GLUCOSE, CAPILLARY
Glucose-Capillary: 140 mg/dL — ABNORMAL HIGH (ref 70–99)
Glucose-Capillary: 181 mg/dL — ABNORMAL HIGH (ref 70–99)
Glucose-Capillary: 188 mg/dL — ABNORMAL HIGH (ref 70–99)
Glucose-Capillary: 189 mg/dL — ABNORMAL HIGH (ref 70–99)
Glucose-Capillary: 216 mg/dL — ABNORMAL HIGH (ref 70–99)
Glucose-Capillary: 231 mg/dL — ABNORMAL HIGH (ref 70–99)
Glucose-Capillary: 248 mg/dL — ABNORMAL HIGH (ref 70–99)
Glucose-Capillary: 267 mg/dL — ABNORMAL HIGH (ref 70–99)

## 2019-08-28 LAB — C-REACTIVE PROTEIN: CRP: 9.7 mg/dL — ABNORMAL HIGH (ref <1.0)

## 2019-08-28 LAB — MRSA PCR SCREENING: MRSA by PCR: NEGATIVE

## 2019-08-28 LAB — SARS CORONAVIRUS 2 (TAT 6-24 HRS): SARS Coronavirus 2: NEGATIVE

## 2019-08-28 SURGERY — IRRIGATION AND DEBRIDEMENT FOOT
Anesthesia: Monitor Anesthesia Care | Laterality: Right

## 2019-08-28 MED ORDER — FENTANYL CITRATE (PF) 100 MCG/2ML IJ SOLN
INTRAMUSCULAR | Status: DC | PRN
  Administered 2019-08-28: 100 ug via INTRAVENOUS

## 2019-08-28 MED ORDER — BUPIVACAINE HCL (PF) 0.5 % IJ SOLN
INTRAMUSCULAR | Status: AC
  Filled 2019-08-28: qty 30

## 2019-08-28 MED ORDER — PHENYLEPHRINE HCL (PRESSORS) 10 MG/ML IV SOLN
INTRAVENOUS | Status: DC | PRN
  Administered 2019-08-28 (×2): 80 ug via INTRAVENOUS

## 2019-08-28 MED ORDER — PROMETHAZINE HCL 25 MG/ML IJ SOLN: 6.2500 mg | INTRAMUSCULAR | Status: DC | PRN

## 2019-08-28 MED ORDER — FENTANYL CITRATE (PF) 100 MCG/2ML IJ SOLN
INTRAMUSCULAR | Status: AC
  Filled 2019-08-28: qty 2

## 2019-08-28 MED ORDER — MIDAZOLAM HCL 2 MG/2ML IJ SOLN
INTRAMUSCULAR | Status: AC
  Filled 2019-08-28: qty 2

## 2019-08-28 MED ORDER — SODIUM CHLORIDE 0.9 % IV SOLN: INTRAVENOUS | Status: AC

## 2019-08-28 MED ORDER — MEPERIDINE HCL 50 MG/ML IJ SOLN: 6.2500 mg | INTRAMUSCULAR | Status: DC | PRN

## 2019-08-28 MED ORDER — ACETAMINOPHEN 10 MG/ML IV SOLN: 1000.0000 mg | Freq: Once | INTRAVENOUS | Status: DC | PRN

## 2019-08-28 MED ORDER — SODIUM CHLORIDE 0.9 % IR SOLN
Status: DC | PRN
  Administered 2019-08-28: 3000 mL

## 2019-08-28 MED ORDER — PROPOFOL 10 MG/ML IV BOLUS
INTRAVENOUS | Status: DC | PRN
  Administered 2019-08-28: 40 mg via INTRAVENOUS

## 2019-08-28 MED ORDER — BUPIVACAINE HCL (PF) 0.5 % IJ SOLN
INTRAMUSCULAR | Status: DC | PRN
  Administered 2019-08-28: 10 mL

## 2019-08-28 MED ORDER — PROPOFOL 500 MG/50ML IV EMUL: INTRAVENOUS | Status: DC | PRN

## 2019-08-28 MED ORDER — FENTANYL CITRATE (PF) 100 MCG/2ML IJ SOLN: 25.0000 ug | INTRAMUSCULAR | Status: DC | PRN

## 2019-08-28 MED ORDER — LACTATED RINGERS IV SOLN: INTRAVENOUS | Status: DC | PRN

## 2019-08-28 MED ORDER — MIDAZOLAM HCL 5 MG/5ML IJ SOLN
INTRAMUSCULAR | Status: DC | PRN
  Administered 2019-08-28: 2 mg via INTRAVENOUS

## 2019-08-28 MED ORDER — INSULIN GLARGINE 100 UNIT/ML ~~LOC~~ SOLN
10.0000 [IU] | Freq: Every day | SUBCUTANEOUS | Status: DC
  Administered 2019-08-28: 10 [IU] via SUBCUTANEOUS
  Filled 2019-08-28 (×2): qty 0.1

## 2019-08-28 MED ORDER — 0.9 % SODIUM CHLORIDE (POUR BTL) OPTIME
TOPICAL | Status: DC | PRN
  Administered 2019-08-28: 1000 mL

## 2019-08-28 MED ORDER — VANCOMYCIN HCL 1000 MG IV SOLR
INTRAVENOUS | Status: AC
  Filled 2019-08-28: qty 1000

## 2019-08-28 MED ORDER — PROPOFOL 500 MG/50ML IV EMUL
INTRAVENOUS | Status: DC | PRN
  Administered 2019-08-28: 75 ug/kg/min via INTRAVENOUS

## 2019-08-28 SURGICAL SUPPLY — 51 items
BLADE HEX COATED 2.75 (ELECTRODE) ×2 IMPLANT
BLADE SURG 15 STRL LF DISP TIS (BLADE) ×1 IMPLANT
BLADE SURG 15 STRL SS (BLADE) ×1
BNDG ELASTIC 3X5.8 VLCR STR LF (GAUZE/BANDAGES/DRESSINGS) ×2 IMPLANT
BNDG ELASTIC 4X5.8 VLCR STR LF (GAUZE/BANDAGES/DRESSINGS) IMPLANT
BNDG ESMARK 4X9 LF (GAUZE/BANDAGES/DRESSINGS) ×2 IMPLANT
BNDG GAUZE ELAST 4 BULKY (GAUZE/BANDAGES/DRESSINGS) ×2 IMPLANT
CHLORAPREP W/TINT 26 (MISCELLANEOUS) ×2 IMPLANT
COVER BACK TABLE 60X90IN (DRAPES) ×2 IMPLANT
COVER WAND RF STERILE (DRAPES) IMPLANT
CUFF TOURN SGL QUICK 18X4 (TOURNIQUET CUFF) IMPLANT
DRAPE EXTREMITY T 121X128X90 (DISPOSABLE) ×2 IMPLANT
DRAPE IMP U-DRAPE 54X76 (DRAPES) ×2 IMPLANT
DRAPE U-SHAPE 47X51 STRL (DRAPES) ×2 IMPLANT
DRSG EMULSION OIL 3X3 NADH (GAUZE/BANDAGES/DRESSINGS) ×2 IMPLANT
DRSG PAD ABDOMINAL 8X10 ST (GAUZE/BANDAGES/DRESSINGS) IMPLANT
DRSG VAC ATS MED SENSATRAC (GAUZE/BANDAGES/DRESSINGS) ×2 IMPLANT
ELECT REM PT RETURN 15FT ADLT (MISCELLANEOUS) ×2 IMPLANT
GAUZE 4X4 16PLY RFD (DISPOSABLE) IMPLANT
GAUZE SPONGE 4X4 12PLY STRL (GAUZE/BANDAGES/DRESSINGS) ×2 IMPLANT
GLOVE BIO SURGEON STRL SZ7.5 (GLOVE) ×2 IMPLANT
GLOVE BIOGEL PI IND STRL 8 (GLOVE) ×1 IMPLANT
GLOVE BIOGEL PI INDICATOR 8 (GLOVE) ×1
GOWN STRL REUS W/ TWL LRG LVL3 (GOWN DISPOSABLE) ×1 IMPLANT
GOWN STRL REUS W/ TWL XL LVL3 (GOWN DISPOSABLE) ×1 IMPLANT
GOWN STRL REUS W/TWL LRG LVL3 (GOWN DISPOSABLE) ×1
GOWN STRL REUS W/TWL XL LVL3 (GOWN DISPOSABLE) ×1
HANDPIECE INTERPULSE COAX TIP (DISPOSABLE) ×1
KIT BASIN OR (CUSTOM PROCEDURE TRAY) ×2 IMPLANT
MANIFOLD NEPTUNE II (INSTRUMENTS) ×2 IMPLANT
NEEDLE HYPO 25X1 1.5 SAFETY (NEEDLE) ×2 IMPLANT
NS IRRIG 1000ML POUR BTL (IV SOLUTION) IMPLANT
PACK ORTHO EXTREMITY (CUSTOM PROCEDURE TRAY) ×2 IMPLANT
PADDING CAST ABS 4INX4YD NS (CAST SUPPLIES) ×1
PADDING CAST ABS COTTON 4X4 ST (CAST SUPPLIES) ×1 IMPLANT
PENCIL SMOKE EVACUATOR (MISCELLANEOUS) IMPLANT
SET HNDPC FAN SPRY TIP SCT (DISPOSABLE) ×1 IMPLANT
SET IRRIG Y TYPE TUR BLADDER L (SET/KITS/TRAYS/PACK) IMPLANT
SLEEVE SCD COMPRESS KNEE MED (MISCELLANEOUS) ×2 IMPLANT
STOCKINETTE 8 INCH (MISCELLANEOUS) ×4 IMPLANT
SUT ETHILON 4 0 PS 2 18 (SUTURE) IMPLANT
SUT MNCRL AB 3-0 PS2 18 (SUTURE) IMPLANT
SUT MNCRL AB 4-0 PS2 18 (SUTURE) IMPLANT
SUT MON AB 5-0 PS2 18 (SUTURE) IMPLANT
SUT VIC AB 3-0 FS2 27 (SUTURE) ×2 IMPLANT
SUT VICRYL 4-0 PS2 18IN ABS (SUTURE) ×2 IMPLANT
SYR BULB 3OZ (MISCELLANEOUS) ×2 IMPLANT
SYR CONTROL 10ML LL (SYRINGE) ×2 IMPLANT
TOWEL OR 17X26 10 PK STRL BLUE (TOWEL DISPOSABLE) ×2 IMPLANT
UNDERPAD 30X36 HEAVY ABSORB (UNDERPADS AND DIAPERS) ×2 IMPLANT
YANKAUER SUCT BULB TIP NO VENT (SUCTIONS) ×2 IMPLANT

## 2019-08-28 NOTE — Progress Notes (Signed)
Patient report insulin pump was turned off during MRI procedure in the ED and was turned back on when it was completed; however, the continuous glucose monitor within the pump was not able to be turn back on. Insulin pump was able to administered insulin but was unable to monitor patient's glucose accordingly.  On-call provider notified and sliding scale insulin was ordered. Patient's insulin pump is now turned off by patient.

## 2019-08-28 NOTE — Consult Note (Signed)
WOC Nurse Consult Note: Reason for Consult: Full thickness neuropathic ulcer to right heel, early osteomyelitis noted.  Admitted for debridement vs surgical intervention.  Podiatry consult pending.  Podiatry orders from 08/27/19 are for betadine swabs and dry dressings daily.  Will continue these orders while waiting on MD consult.  Wound type:neuropathic ulcers Pressure Injury POA: NA Measurement: 15 cm x 5 cm with maroon discoloration to central wound bed and devitalized tissue noted.  Wound bed:30%devitalized maroon tissue 88%  rubrous slick tissue and 32% pale pink nongranulating tissue  Center is boggy with some purulence noted Drainage (amount, consistency, odor)minimal serosanguinous  Periwound: edema to right lower leg Dressing procedure/placement/frequency: Cleanse right heel with NS and pat dry.  Apply betadine to wound bed. And cover with dry dressing daily.  Will not follow at this time.  Please re-consult if needed.  Domenic Moras MSN, RN, FNP-BC CWON Wound, Ostomy, Continence Nurse Pager [phone]

## 2019-08-28 NOTE — Progress Notes (Signed)
PROGRESS NOTE  Javier Hunt  DOB: 01-Nov-1975  PCP: [name], MD KZS:[id number]  DOA: 08/27/2019 Admitted From: Home  LOS: 1 day   Chief Complaint  Patient presents with  . infected foot    right   Brief narrative: [name] is a 44 y.o. male with medical history significant of hypertension, hyperlipidemia, type 1 diabetes, GERD, CKD stage III, recent hospitalization for anemia and right foot wound. Patient presented to the ED on 3/25 from his podiatrist's office for evaluation of worsening foot infection.  Patient states he has had a foot wound since January.  Evidently he did not notice it getting worse.   He has type 1 diabetes mellitus and uses all insulin pump.    In the ED he had a temperature of 100.4, WBC count 13.4, lactic acid normal provide, BUN 29, creatinine 1.7, stable for last 2 weeks. MRI right ankle findings as below 1. Findings of advanced midfoot Charcot arthropathy with extensive fragmentation of the midfoot and inferior migration of the talus.  2. There is moderate to large ankle and midfoot effusion with synovitis. 3. Plantar surface ulceration without evidence of sinus tract or abscess. However there is findings suggestive of probable early osteomyelitis involving the calcaneus. 4. Findings which could be suggestive early osteomyelitis versus reactive marrow within the distal tibia and distal fibula. 5. Nonunited distal fibular fracture. 6. Probable nonunited small fracture of the sustentacular tali. 7. Probable partial prior tear of the spring ligament and the posterior tibiofibular ligament. 8. Partial insertional tear of the middle cord of the plantar fascia with plantar fasciitis 9. Extensive dorsal subcutaneous edema and denervation atrophy  Patient was admitted to hospitalist service.  Podiatry consultation was obtained.  Subjective: Patient was seen and examined this morning.  Pleasant, young, African-American male. Pain controlled. Insulin pump  not working.   Assessment/Plan: Acute osteomyelitis of right calcaneus Acute osteomyelitis of distal right tibia and distal fibula -MRI findings as above.  ID consulted.  Planned for I&D today. -Continue broad-spectrum IV antibiotics.  Right lower extremity edema: Venous Doppler negative for DVT.  Anemia:  -Based on work-up done during recent hospitalization, anemia is due to combination of iron deficiency and chronic disease/CKD.  He was given 2 units of blood during his recent hospitalization.  His FOBT was negative at that time.  -Hemoglobin was 8.2 on admission, 7.7 today.  No active bleeding.  Continue iron supplement.   CKD stage III: Creatinine 1.7, stable compared to recent labs.  No prior labs for comparison.  Continue to monitor renal function during this hospitalization and ensure outpatient nephrology follow-up.  Type 1 diabetes: A1c 6.9 on 08/12/2019.  -Insulin pump not working.  Patient has been started on Lantus with sliding scale insulin.  10 units of Lantus given this morning.  Continue to monitor.  Hypertension: Blood pressure slightly elevated.  Continue home amlodipine, metoprolol  Hyperlipidemia: Continue home Lipitor, Zetia  GERD: Continue PPI  DVT prophylaxis: SCDs Code Status: Full code Family Communication: No family available at this time. Disposition Plan: Anticipate discharge after clinical improvement.  Will likely need surgery for debridement. Consults called: Podiatry  Body mass index is 30.35 kg/m. Code Status:  Full code DVT prophylaxis:  SCDs Antimicrobials:  IV vancomycin, IV Levaquin Fluid: Start normal saline at 75 mL/h Diet: N.p.o. for now.  Diabetic diet post procedure Mobility: Needs PT eval post procedure Family Communication:  None at bedside Discharge plan:  Anticipated date and disposition: Probably next 2 to 3 days in  the hospital Barriers: Pending procedure.     Consultants:  Podiatry   Antimicrobials: Anti-infectives (From admission, onward)   Start     Dose/Rate Route Frequency Ordered Stop   08/28/19 2200  vancomycin (VANCOCIN) IVPB 1000 mg/200 mL premix     1,000 mg 200 mL/hr over 60 Minutes Intravenous Every 24 hours 08/27/19 2045     08/28/19 2000  levofloxacin (LEVAQUIN) IVPB 750 mg     750 mg 100 mL/hr over 90 Minutes Intravenous Every 24 hours 08/27/19 2045     08/27/19 2300  vancomycin (VANCOREADY) IVPB 500 mg/100 mL     500 mg 100 mL/hr over 60 Minutes Intravenous  Once 08/27/19 2045 08/28/19 0014   08/27/19 2045  vancomycin (VANCOREADY) IVPB 500 mg/100 mL  Status:  Discontinued     500 mg 100 mL/hr over 60 Minutes Intravenous  Once 08/27/19 2037 08/27/19 2303   08/27/19 1815  vancomycin (VANCOCIN) IVPB 1000 mg/200 mL premix     1,000 mg 200 mL/hr over 60 Minutes Intravenous  Once 08/27/19 1810 08/27/19 2041   08/27/19 1815  levofloxacin (LEVAQUIN) IVPB 750 mg     750 mg 100 mL/hr over 90 Minutes Intravenous  Once 08/27/19 1810 08/27/19 2212        Code Status: Full Code   Diet Order            Diet NPO time specified  Diet effective now              Infusions:  . sodium chloride    . levofloxacin (LEVAQUIN) IV    . vancomycin      Scheduled Meds: . amLODipine  5 mg Oral Daily  . atorvastatin  40 mg Oral q1800  . ezetimibe  10 mg Oral Daily  . feeding supplement (ENSURE ENLIVE)  237 mL Oral BID BM  . ferrous sulfate  325 mg Oral Q breakfast  . insulin aspart  0-15 Units Subcutaneous Q4H  . insulin glargine  10 Units Subcutaneous Daily  . metoprolol succinate  50 mg Oral Daily  . pantoprazole  40 mg Oral Daily  . sodium chloride flush  3 mL Intravenous Once  . [START ON 08/31/2019] Vitamin D (Ergocalciferol)  50,000 Units Oral Once per day on Mon Thu    PRN meds: acetaminophen **OR** acetaminophen, senna-docusate   Objective: Vitals:   08/28/19 0416 08/28/19 1352  BP: (!) 144/71 134/90  Pulse:  87 84  Resp: 18   Temp: 99 F (37.2 C) 98.7 F (37.1 C)  SpO2: 95% 96%    Intake/Output Summary (Last 24 hours) at 08/28/2019 1439 Last data filed at 08/28/2019 0200 Gross per 24 hour  Intake 326.96 ml  Output --  Net 326.96 ml   Filed Weights   08/27/19 2049 08/27/19 2242  Weight: 88.9 kg 85.3 kg   Weight change:  Body mass index is 30.35 kg/m.   Physical Exam: General exam: Appears calm and comfortable.  Skin: No rashes, lesions or ulcers. HEENT: Atraumatic, normocephalic, supple neck, no obvious bleeding Lungs: Clear to auscultation bilaterally CVS: Regular rate and rhythm, no murmur GI/Abd soft, nontender, nondistended, bowel sounds present CNS: Alert, awake, oriented x3 Psychiatry: Mood appropriate Extremities: Right foot with osteomyelitis  Data Review: I have personally reviewed the laboratory data and studies available.  Recent Labs  Lab 08/27/19 1731 08/28/19 0530  WBC 13.4* 11.1*  NEUTROABS 10.4*  --   HGB 8.2* 7.7*  HCT 27.3* 25.9*  MCV 84.0 83.3  PLT 475*  433*   Recent Labs  Lab 08/27/19 1731 08/28/19 0530  NA 138 136  K 4.3 4.4  CL 106 105  CO2 23 21*  GLUCOSE 147* 221*  BUN 29* 27*  CREATININE 1.72* 1.60*  CALCIUM 8.7* 8.7*    Signed, Terrilee Croak, MD [facility] Pager: [phone] (Secure Chat preferred). 08/28/2019

## 2019-08-28 NOTE — Transfer of Care (Signed)
Immediate Anesthesia Transfer of Care Note  Patient: Javier Hunt  Procedure(s) Performed: Procedure(s): RIght foot wound incision and drainage, debridement and irrigation, bone biopsy, application of wound VAC (Right)  Patient Location: PACU  Anesthesia Type:MAC  Level of Consciousness:  sedated, patient cooperative and responds to stimulation  Airway & Oxygen Therapy:Patient Spontanous Breathing and Patient connected to face mask oxgen  Post-op Assessment:  Report given to PACU RN and Post -op Vital signs reviewed and stable  Post vital signs:  Reviewed and stable  Last Vitals:  Vitals:   08/28/19 1746 08/28/19 1900  BP: (!) 158/82   Pulse: 76   Resp: 18   Temp: 37.3 C 36.9 C  SpO2: 32%     Complications: No apparent anesthesia complications

## 2019-08-28 NOTE — Anesthesia Preprocedure Evaluation (Signed)
Anesthesia Evaluation  Patient identified by MRN, date of birth, ID band Patient awake    Reviewed: Allergy & Precautions, NPO status , Patient's Chart, lab work & pertinent test results  Airway Mallampati: I       Dental no notable dental hx. (+) Teeth Intact   Pulmonary neg pulmonary ROS,    Pulmonary exam normal breath sounds clear to auscultation       Cardiovascular Normal cardiovascular exam Rhythm:Regular Rate:Normal     Neuro/Psych  Neuromuscular disease negative psych ROS   GI/Hepatic GERD  Medicated,  Endo/Other  diabetes, Insulin Dependent  Renal/GU Renal InsufficiencyRenal diseaseCKD III  negative genitourinary   Musculoskeletal   Abdominal Normal abdominal exam  (+)   Peds  Hematology  (+) Blood dyscrasia, anemia ,   Anesthesia Other Findings   Reproductive/Obstetrics                             Anesthesia Physical Anesthesia Plan  ASA: III  Anesthesia Plan: MAC   Post-op Pain Management:    Induction:   PONV Risk Score and Plan: 1 and Ondansetron  Airway Management Planned: Nasal Cannula, Natural Airway and Simple Face Mask  Additional Equipment: None  Intra-op Plan:   Post-operative Plan:   Informed Consent: I have reviewed the patients History and Physical, chart, labs and discussed the procedure including the risks, benefits and alternatives for the proposed anesthesia with the patient or authorized representative who has indicated his/her understanding and acceptance.     Dental advisory given  Plan Discussed with: CRNA  Anesthesia Plan Comments:         Anesthesia Quick Evaluation

## 2019-08-28 NOTE — Op Note (Signed)
Patient Name: Javier Hunt DOB: 06-23-1975  MRN: [id number]   Date of Service: 08/28/19  Surgeon: Dr. Hardie Pulley, DPM Assistants: None Pre-operative Diagnosis:  Right foot ulcer, osteomyelitis Post-operative Diagnosis:  Same Procedures:  1) Debridement below the deep fascia right foot  2) Bone biopsy right calcaneus  3) Applicaton of wound VAC Pathology/Specimens: ID Type Source Tests Collected by Time Destination  1 : right foot calcaneus bone Tissue PATH Bone resection SURGICAL PATHOLOGY Evelina Bucy, DPM 08/28/2019 1851   A : right heel swab Tissue PATH Other AEROBIC/ANAEROBIC CULTURE (SURGICAL/DEEP WOUND) Evelina Bucy, DPM 08/28/2019 1842   B : bone culture, right heel Tissue PATH Bone resection AEROBIC/ANAEROBIC CULTURE (SURGICAL/DEEP WOUND) Evelina Bucy, DPM 08/28/2019 1848    Anesthesia: MAC Hemostasis: none Estimated Blood Loss: 10 ml Materials: * No implants in log * Medications: none Complications: none  Indications for Procedure:  This is a 44 y.o. male with a large wound to the right heel with concerns of Charcot and possible osteomyelitis.  It was discussed to benefit from debridement of the wound.  All risk benefits of surgery discussed the patient no guarantees were given   Procedure in Detail: Patient was identified in pre-operative holding area. Formal consent was signed and the right lower extremity was marked. Patient was brought back to the operating room. Anesthesia was induced. The extremity was prepped and draped in the usual sterile fashion. Timeout was taken to confirm patient name, laterality, and procedure prior to incision.   Attention was then directed to the right heel.  The wound was sharply excisionally debrided with a 15 blade.  The wound was debrided until viable tissue was encountered.  The calcaneus bone was encountered but did appear viable.  A portion of this was sent for both bone culture and histology.  The wound was then thoroughly  irrigated with pulse lavage for a total of 3 L.  Post debridement the wound measured 5x6x1cm.  A black wound VAC sponge was applied to the wound base.  This was adhered with adherent dressing.  A hole was made and the draping to allow for bridging.  A black foam comma shaped piece was used to bridge the wound VAC from plantar to dorsal.  This was adhered with additional transparent dressing and set to suction to 125 with good seal noted.  The foot was then dressed with Ace bandage. Patient tolerated the procedure well.   Disposition: Following a period of post-operative monitoring, patient will be transferred back to the floor.

## 2019-08-28 NOTE — Brief Op Note (Signed)
08/28/2019  7:09 PM  PATIENT:  Javier Hunt  44 y.o. male  PRE-OPERATIVE DIAGNOSIS:  Diabetic ulcer, osteomyelitis  POST-OPERATIVE DIAGNOSIS:  Diabetic ulcer, osteomyelitis  PROCEDURE:  Procedure(s): RIght foot wound incision and drainage, debridement and irrigation, bone biopsy, application of wound VAC (Right)  SURGEON:  Surgeon(s) and Role:    * Evelina Bucy, DPM - Primary  PHYSICIAN ASSISTANT:   ASSISTANTS: none   ANESTHESIA:   local and MAC  EBL:  25 ml   BLOOD ADMINISTERED:none  DRAINS: wound VAC   LOCAL MEDICATIONS USED:  MARCAINE    and Amount: 10 ml  SPECIMEN:   ID Type Source Tests Collected by Time Destination  1 : right foot calcaneus bone Tissue PATH Bone resection SURGICAL PATHOLOGY Evelina Bucy, DPM 08/28/2019 1851   A : right heel swab Tissue PATH Other AEROBIC/ANAEROBIC CULTURE (SURGICAL/DEEP WOUND) Evelina Bucy, DPM 08/28/2019 1842   B : bone culture, right heel Tissue PATH Bone resection AEROBIC/ANAEROBIC CULTURE (SURGICAL/DEEP WOUND) Evelina Bucy, DPM 08/28/2019 1848       DISPOSITION OF SPECIMEN:  As above  COUNTS:  YES  TOURNIQUET:  * Missing tourniquet times found for documented tourniquets in log: [id number] *  DICTATION: .Viviann Spare Dictation  PLAN OF CARE: Discharge to home after PACU  PATIENT DISPOSITION:  PACU - hemodynamically stable.   Delay start of Pharmacological VTE agent (>24hrs) due to surgical blood loss or risk of bleeding: not applicable

## 2019-08-28 NOTE — Progress Notes (Signed)
Subjective:  Patient ID: Javier Hunt, male    DOB: September 04, 1975,  MRN: [id number]  Patient seen bedside. Leg and foot hurting less. Denies overnight issues Objective:   Vitals:   08/28/19 1352 08/28/19 1746  BP: 134/90 (!) 158/82  Pulse: 84 76  Resp:  18  Temp: 98.7 F (37.1 C) 99.2 F (37.3 C)  SpO2: 96% 96%   General AA&O x3. Normal mood and affect.  Vascular Right foot and leg warm and well perfused.  Neurologic Epicritic sensation grossly diminished.  Dermatologic  Dressing intact right foot  Orthopedic: Edema right leg, decreased since yesterday   Results for orders placed or performed during the hospital encounter of 08/27/19 (from the past 24 hour(s))  Culture, blood (routine x 2)     Status: None (Preliminary result)   Collection Time: 08/27/19  7:00 PM   Specimen: BLOOD  Result Value Ref Range   Specimen Description      BLOOD LEFT ANTECUBITAL Performed at [facility], [place] [address] [place], [place] [address]    Special Requests      BOTTLES DRAWN AEROBIC ONLY Blood Culture results may not be optimal due to an inadequate volume of blood received in culture bottles Performed at [facility] [address] [place], [place] [address]    Culture      NO GROWTH < 12 HOURS Performed at [facility] [address], [place], [place] [address]    Report Status PENDING   Culture, blood (routine x 2)     Status: None (Preliminary result)   Collection Time: 08/27/19  7:00 PM   Specimen: BLOOD LEFT HAND  Result Value Ref Range   Specimen Description      BLOOD LEFT HAND Performed at [facility] [address] [place], [place] [address]    Special Requests      BOTTLES DRAWN AEROBIC AND ANAEROBIC Blood Culture adequate volume Performed at [facility] [address], [place], [place] [address]    Culture      NO GROWTH < 12 HOURS Performed at [facility]  [address] [place], [place] [address]    Report Status PENDING   SARS CORONAVIRUS 2 (TAT 6-24 HRS) Nasopharyngeal Nasopharyngeal Swab     Status: None   Collection Time: 08/27/19  7:28 PM   Specimen: Nasopharyngeal Swab  Result Value Ref Range   SARS Coronavirus 2 NEGATIVE NEGATIVE  Glucose, capillary     Status: Abnormal   Collection Time: 08/27/19 10:46 PM  Result Value Ref Range   Glucose-Capillary 135 (H) 70 - 99 mg/dL  Glucose, capillary     Status: Abnormal   Collection Time: 08/28/19 12:07 AM  Result Value Ref Range   Glucose-Capillary 140 (H) 70 - 99 mg/dL  Glucose, capillary     Status: Abnormal   Collection Time: 08/28/19  4:13 AM  Result Value Ref Range   Glucose-Capillary 189 (H) 70 - 99 mg/dL  C-reactive protein     Status: Abnormal   Collection Time: 08/28/19  5:30 AM  Result Value Ref Range   CRP 9.7 (H) <1.0 mg/dL  CBC     Status: Abnormal   Collection Time: 08/28/19  5:30 AM  Result Value Ref Range   WBC 11.1 (H) 4.0 - 10.5 K/uL   RBC 3.11 (L) 4.22 - 5.81 MIL/uL   Hemoglobin 7.7 (L) 13.0 - 17.0 g/dL   HCT 25.9 (L) 39.0 - 52.0 %  MCV 83.3 80.0 - 100.0 fL   MCH 24.8 (L) 26.0 - 34.0 pg   MCHC 29.7 (L) 30.0 - 36.0 g/dL   RDW 14.3 11.5 - 15.5 %   Platelets 433 (H) 150 - 400 K/uL   nRBC 0.0 0.0 - 0.2 %  Basic metabolic panel     Status: Abnormal   Collection Time: 08/28/19  5:30 AM  Result Value Ref Range   Sodium 136 135 - 145 mmol/L   Potassium 4.4 3.5 - 5.1 mmol/L   Chloride 105 98 - 111 mmol/L   CO2 21 (L) 22 - 32 mmol/L   Glucose, Bld 221 (H) 70 - 99 mg/dL   BUN 27 (H) 6 - 20 mg/dL   Creatinine, Ser 1.60 (H) 0.61 - 1.24 mg/dL   Calcium 8.7 (L) 8.9 - 10.3 mg/dL   GFR calc non Af Amer 52 (L) >60 mL/min   GFR calc Af Amer 60 (L) >60 mL/min   Anion gap 10 5 - 15  Glucose, capillary     Status: Abnormal   Collection Time: 08/28/19  7:33 AM  Result Value Ref Range   Glucose-Capillary 181 (H) 70 - 99 mg/dL  Glucose, capillary     Status: Abnormal   Collection  Time: 08/28/19 11:56 AM  Result Value Ref Range   Glucose-Capillary 267 (H) 70 - 99 mg/dL  Glucose, capillary     Status: Abnormal   Collection Time: 08/28/19  4:29 PM  Result Value Ref Range   Glucose-Capillary 248 (H) 70 - 99 mg/dL    Assessment & Plan:  Patient was evaluated and treated and all questions answered.  Right foot diabetic ulcer with infection, concomitant Charcot, likely osteomyelitis -Patient seen and evaluated -MRI reviewed. Suggests possible OM. -Labs reviewed -Continue empiric IV abx -Discussed again that though he is at high risk of BKA, we can proceed with trial of salvage with incision and drainage, debridement of RLE wound, possible bone biopsy right foot, possible wound VAC application. Patient understands and wishes to proceed. No guarantees as to outcome were made. -Pre-op orders placed. -Lift NPO for breakfast, resume thereafter. -Surgery this evening.  Evelina Bucy, DPM  Accessible via secure chat for questions or concerns.

## 2019-08-28 NOTE — Progress Notes (Signed)
Inpatient Diabetes Program Recommendations  AACE/ADA: New Consensus Statement on Inpatient Glycemic Control (2015)  Target Ranges:  Prepandial:   less than 140 mg/dL      Peak postprandial:   less than 180 mg/dL (1-2 hours)      Critically ill patients:  140 - 180 mg/dL   Lab Results  Component Value Date   GLUCAP 267 (H) 08/28/2019   HGBA1C 6.9 (H) 08/12/2019    Review of Glycemic Control  Diabetes history: DM 1 since age 44 Outpatient Diabetes medications: Insulin pump-670G with medtronic sensor- Per review of insulin pump settings on 08/12/19 , his average basal dose for the past 7 days was 24.5 units- He also covers CHO with 1 units/10 grams of CHO and his correction factor is approximately 40 mg/dL with a goal oc 100 mg/dL Current orders for Inpatient glycemic control: Lantus 10 units QD, Novolog 0-9 units Q4H.   Inpatient Diabetes Program Recommendations:     Needs basal insulin ASAP. Has not been given per [facility] this morning.  Recommend Lantus 25 units daily starting 3/27 am Novolog 0-9 units tidwc and hs when eating Novolog 5 units tidwc for meal coverage insulin (1 unit per 10 g CHO)  Insulin pump should not be restarted until 24H after Lantus administered.  If CGM not working, will need glucose meter prescription at discharge.   Thank you. Lorenda Peck, RD, LDN, CDE Inpatient Diabetes Coordinator [phone]

## 2019-08-29 LAB — BASIC METABOLIC PANEL
Anion gap: 7 (ref 5–15)
BUN: 22 mg/dL — ABNORMAL HIGH (ref 6–20)
CO2: 25 mmol/L (ref 22–32)
Calcium: 8.4 mg/dL — ABNORMAL LOW (ref 8.9–10.3)
Chloride: 106 mmol/L (ref 98–111)
Creatinine, Ser: 1.66 mg/dL — ABNORMAL HIGH (ref 0.61–1.24)
GFR calc Af Amer: 57 mL/min — ABNORMAL LOW (ref >60)
GFR calc non Af Amer: 49 mL/min — ABNORMAL LOW (ref >60)
Glucose, Bld: 127 mg/dL — ABNORMAL HIGH (ref 70–99)
Potassium: 4 mmol/L (ref 3.5–5.1)
Sodium: 138 mmol/L (ref 135–145)

## 2019-08-29 LAB — GLUCOSE, CAPILLARY
Glucose-Capillary: 134 mg/dL — ABNORMAL HIGH (ref 70–99)
Glucose-Capillary: 258 mg/dL — ABNORMAL HIGH (ref 70–99)
Glucose-Capillary: 264 mg/dL — ABNORMAL HIGH (ref 70–99)
Glucose-Capillary: 320 mg/dL — ABNORMAL HIGH (ref 70–99)
Glucose-Capillary: 353 mg/dL — ABNORMAL HIGH (ref 70–99)
Glucose-Capillary: 367 mg/dL — ABNORMAL HIGH (ref 70–99)

## 2019-08-29 LAB — CBC WITH DIFFERENTIAL/PLATELET
Abs Immature Granulocytes: 0.06 10*3/uL (ref 0.00–0.07)
Basophils Absolute: 0 10*3/uL (ref 0.0–0.1)
Basophils Relative: 0 %
Eosinophils Absolute: 0.4 10*3/uL (ref 0.0–0.5)
Eosinophils Relative: 4 %
HCT: 25.2 % — ABNORMAL LOW (ref 39.0–52.0)
Hemoglobin: 7.7 g/dL — ABNORMAL LOW (ref 13.0–17.0)
Immature Granulocytes: 1 %
Lymphocytes Relative: 12 %
Lymphs Abs: 1.2 10*3/uL (ref 0.7–4.0)
MCH: 25.9 pg — ABNORMAL LOW (ref 26.0–34.0)
MCHC: 30.6 g/dL (ref 30.0–36.0)
MCV: 84.8 fL (ref 80.0–100.0)
Monocytes Absolute: 1.1 10*3/uL — ABNORMAL HIGH (ref 0.1–1.0)
Monocytes Relative: 11 %
Neutro Abs: 7.2 10*3/uL (ref 1.7–7.7)
Neutrophils Relative %: 72 %
Platelets: 458 10*3/uL — ABNORMAL HIGH (ref 150–400)
RBC: 2.97 MIL/uL — ABNORMAL LOW (ref 4.22–5.81)
RDW: 14.1 % (ref 11.5–15.5)
WBC: 10 10*3/uL (ref 4.0–10.5)
nRBC: 0 % (ref 0.0–0.2)

## 2019-08-29 MED ORDER — INSULIN ASPART 100 UNIT/ML ~~LOC~~ SOLN
0.0000 [IU] | Freq: Three times a day (TID) | SUBCUTANEOUS | Status: DC
  Administered 2019-08-29: 1 [IU] via SUBCUTANEOUS
  Administered 2019-08-29: 9 [IU] via SUBCUTANEOUS
  Administered 2019-08-29: 5 [IU] via SUBCUTANEOUS
  Administered 2019-08-30: 2 [IU] via SUBCUTANEOUS
  Administered 2019-08-30: 9 [IU] via SUBCUTANEOUS
  Administered 2019-08-30: 7 [IU] via SUBCUTANEOUS
  Administered 2019-08-31: 5 [IU] via SUBCUTANEOUS
  Administered 2019-08-31: 7 [IU] via SUBCUTANEOUS
  Administered 2019-08-31: 5 [IU] via SUBCUTANEOUS
  Administered 2019-09-01: 7 [IU] via SUBCUTANEOUS

## 2019-08-29 MED ORDER — INSULIN GLARGINE 100 UNIT/ML ~~LOC~~ SOLN
15.0000 [IU] | Freq: Every day | SUBCUTANEOUS | Status: DC
  Administered 2019-08-29: 15 [IU] via SUBCUTANEOUS
  Filled 2019-08-29 (×2): qty 0.15

## 2019-08-29 MED ORDER — INSULIN ASPART 100 UNIT/ML ~~LOC~~ SOLN
0.0000 [IU] | Freq: Every day | SUBCUTANEOUS | Status: DC
  Administered 2019-08-29: 4 [IU] via SUBCUTANEOUS
  Administered 2019-08-30 – 2019-08-31 (×2): 3 [IU] via SUBCUTANEOUS

## 2019-08-29 NOTE — Plan of Care (Signed)
  Problem: Clinical Measurements: Goal: Will remain free from infection Outcome: Progressing Goal: Diagnostic test results will improve Outcome: Progressing Goal: Respiratory complications will improve Outcome: Progressing Goal: Cardiovascular complication will be avoided Outcome: Progressing   Problem: Activity: Goal: Risk for activity intolerance will decrease Outcome: Progressing   Problem: Nutrition: Goal: Adequate nutrition will be maintained Outcome: Progressing   Problem: Coping: Goal: Level of anxiety will decrease Outcome: Progressing   Problem: Elimination: Goal: Will not experience complications related to bowel motility Outcome: Progressing Goal: Will not experience complications related to urinary retention Outcome: Progressing   Problem: Pain Managment: Goal: General experience of comfort will improve Outcome: Progressing   Problem: Safety: Goal: Ability to remain free from injury will improve Outcome: Progressing   Problem: Skin Integrity: Goal: Risk for impaired skin integrity will decrease Outcome: Progressing   

## 2019-08-29 NOTE — Progress Notes (Signed)
PROGRESS NOTE  Javier Hunt  DOB: 1975/08/20  PCP: [name], MD JKD:[id number]  DOA: 08/27/2019 Admitted From: Home  LOS: 2 days   Chief Complaint  Patient presents with  . infected foot    right   Brief narrative: Javier Hunt is a 44 y.o. male with medical history significant of hypertension, hyperlipidemia, type 1 diabetes, GERD, CKD stage III, recent hospitalization for anemia and right foot wound. Patient presented to the ED on 3/25 from his podiatrist's office for evaluation of worsening foot infection.  Patient states he has had a foot wound since January.  Evidently he did not notice it getting worse.   He has type 1 diabetes mellitus and uses all insulin pump.    In the ED he had a temperature of 100.4, WBC count 13.4, lactic acid normal provide, BUN 29, creatinine 1.7, stable for last 2 weeks. MRI right ankle findings as below 1. Findings of advanced midfoot Charcot arthropathy with extensive fragmentation of the midfoot and inferior migration of the talus.  2. There is moderate to large ankle and midfoot effusion with synovitis. 3. Plantar surface ulceration without evidence of sinus tract or abscess. However there is findings suggestive of probable early osteomyelitis involving the calcaneus. 4. Findings which could be suggestive early osteomyelitis versus reactive marrow within the distal tibia and distal fibula. 5. Nonunited distal fibular fracture. 6. Probable nonunited small fracture of the sustentacular tali. 7. Probable partial prior tear of the spring ligament and the posterior tibiofibular ligament. 8. Partial insertional tear of the middle cord of the plantar fascia with plantar fasciitis 9. Extensive dorsal subcutaneous edema and denervation atrophy  Patient was admitted to hospitalist service.  Podiatry consultation was obtained.  Subjective: Patient was seen and examined this morning.  Sitting up in bed.  Not in distress.  Pain controlled.  Right foot  with dressing on and wound VAC on.  Assessment/Plan: Acute osteomyelitis of right calcaneus Acute osteomyelitis of distal right tibia and distal fibula -MRI findings as above.   -Podiatry consult appreciated. -3/26, patient underwent right foot I&D, debridement and irrigation, bone biopsy and wound VAC application. -Currently on broad-spectrum IV antibiotics. -We will wait for culture report.  May need ID involvement and long-term antibiotics as well. -WBC count improved  Anemia:  -Based on work-up done during recent hospitalization, anemia is due to combination of iron deficiency and chronic disease/CKD.  He was given 2 units of blood during his recent hospitalization.  His FOBT was negative at that time.  -Hemoglobin was 8.2 on admission, 7.7 today.  No active bleeding.  Continue iron supplement.   CKD stage III:  Creatinine 1.7, stable compared to recent labs.  No prior labs for comparison.  Continue to monitor renal function during this hospitalization and ensure outpatient nephrology follow-up.  Type 1 diabetes: A1c 6.9 on 3/10 -Uses insulin pump at home -Currently on Lantus and sliding scale insulin.  10 units Lantus given yesterday.  Blood sugar running up to three hundreds in last 24 hours.  Increase Lantus to 15 units for this morning.  Start on ACHS sliding scale.  Needs further adjustment based on response.  Hypertension: Blood pressure slightly elevated.  Continue home amlodipine, metoprolol  Hyperlipidemia: Continue home Lipitor, Zetia  GERD: Continue PPI  Code Status:  Full code DVT prophylaxis:  SCDs Antimicrobials:  IV vancomycin, IV Levaquin Fluid: Continue saline at 75 mL/h Diet: Diabetic diet post procedure Mobility: Needs PT eval post procedure Family Communication:  None at bedside Discharge  plan:  Anticipated date and disposition: Probably next 2 to 3 days in the hospital Barriers: Waiting wound report.  Podiatry  follow-up.  Consultants:  Podiatry   Antimicrobials: Anti-infectives (From admission, onward)   Start     Dose/Rate Route Frequency Ordered Stop   08/28/19 2200  vancomycin (VANCOCIN) IVPB 1000 mg/200 mL premix     1,000 mg 200 mL/hr over 60 Minutes Intravenous Every 24 hours 08/27/19 2045     08/28/19 2000  levofloxacin (LEVAQUIN) IVPB 750 mg     750 mg 100 mL/hr over 90 Minutes Intravenous Every 24 hours 08/27/19 2045     08/27/19 2300  vancomycin (VANCOREADY) IVPB 500 mg/100 mL     500 mg 100 mL/hr over 60 Minutes Intravenous  Once 08/27/19 2045 08/28/19 0014   08/27/19 2045  vancomycin (VANCOREADY) IVPB 500 mg/100 mL  Status:  Discontinued     500 mg 100 mL/hr over 60 Minutes Intravenous  Once 08/27/19 2037 08/27/19 2303   08/27/19 1815  vancomycin (VANCOCIN) IVPB 1000 mg/200 mL premix     1,000 mg 200 mL/hr over 60 Minutes Intravenous  Once 08/27/19 1810 08/27/19 2041   08/27/19 1815  levofloxacin (LEVAQUIN) IVPB 750 mg     750 mg 100 mL/hr over 90 Minutes Intravenous  Once 08/27/19 1810 08/27/19 2212        Code Status: Full Code   Diet Order            Diet Carb Modified Fluid consistency: Thin; Room service appropriate? Yes  Diet effective now              Infusions:  . levofloxacin (LEVAQUIN) IV 750 mg (08/28/19 2016)  . vancomycin 1,000 mg (08/28/19 2152)    Scheduled Meds: . amLODipine  5 mg Oral Daily  . atorvastatin  40 mg Oral q1800  . ezetimibe  10 mg Oral Daily  . feeding supplement (ENSURE ENLIVE)  237 mL Oral BID BM  . ferrous sulfate  325 mg Oral Q breakfast  . insulin aspart  0-5 Units Subcutaneous QHS  . insulin aspart  0-9 Units Subcutaneous TID WC  . insulin glargine  15 Units Subcutaneous Daily  . metoprolol succinate  50 mg Oral Daily  . pantoprazole  40 mg Oral Daily  . sodium chloride flush  3 mL Intravenous Once  . [START ON 08/31/2019] Vitamin D (Ergocalciferol)  50,000 Units Oral Once per day on Mon Thu    PRN  meds: acetaminophen **OR** acetaminophen, senna-docusate   Objective: Vitals:   08/29/19 0327 08/29/19 0623  BP: 117/69 124/77  Pulse: 70 71  Resp: 18 20  Temp: 98.5 F (36.9 C) 98.7 F (37.1 C)  SpO2: 96% 95%    Intake/Output Summary (Last 24 hours) at 08/29/2019 0810 Last data filed at 08/29/2019 0600 Gross per 24 hour  Intake 1857.79 ml  Output 10 ml  Net 1847.79 ml   Filed Weights   08/27/19 2049 08/27/19 2242  Weight: 88.9 kg 85.3 kg   Weight change:  Body mass index is 30.35 kg/m.   Physical Exam: General exam: Appears calm and comfortable.  Skin: No rashes, lesions or ulcers. HEENT: Atraumatic, normocephalic, supple neck, no obvious bleeding Lungs: Clear to auscultation bilaterally CVS: Regular rate and rhythm, no murmur GI/Abd soft, nontender, nondistended, bowel sounds present CNS: Alert, awake, oriented x3 Psychiatry: Mood appropriate Extremities: Right foot with bandaged wound.  Wound VAC on.  Data Review: I have personally reviewed the laboratory data and studies available.  Recent Labs  Lab 08/27/19 1731 08/28/19 0530  WBC 13.4* 11.1*  NEUTROABS 10.4*  --   HGB 8.2* 7.7*  HCT 27.3* 25.9*  MCV 84.0 83.3  PLT 475* 433*   Recent Labs  Lab 08/27/19 1731 08/28/19 0530  NA 138 136  K 4.3 4.4  CL 106 105  CO2 23 21*  GLUCOSE 147* 221*  BUN 29* 27*  CREATININE 1.72* 1.60*  CALCIUM 8.7* 8.7*    Signed, Terrilee Croak, MD [facility] Pager: [phone] (Secure Chat preferred). 08/29/2019

## 2019-08-30 LAB — CBC WITH DIFFERENTIAL/PLATELET
Abs Immature Granulocytes: 0.04 10*3/uL (ref 0.00–0.07)
Basophils Absolute: 0 10*3/uL (ref 0.0–0.1)
Basophils Relative: 0 %
Eosinophils Absolute: 0.4 10*3/uL (ref 0.0–0.5)
Eosinophils Relative: 5 %
HCT: 28.1 % — ABNORMAL LOW (ref 39.0–52.0)
Hemoglobin: 8.1 g/dL — ABNORMAL LOW (ref 13.0–17.0)
Immature Granulocytes: 0 %
Lymphocytes Relative: 13 %
Lymphs Abs: 1.2 10*3/uL (ref 0.7–4.0)
MCH: 24.8 pg — ABNORMAL LOW (ref 26.0–34.0)
MCHC: 28.8 g/dL — ABNORMAL LOW (ref 30.0–36.0)
MCV: 85.9 fL (ref 80.0–100.0)
Monocytes Absolute: 0.9 10*3/uL (ref 0.1–1.0)
Monocytes Relative: 11 %
Neutro Abs: 6.3 10*3/uL (ref 1.7–7.7)
Neutrophils Relative %: 71 %
Platelets: 472 10*3/uL — ABNORMAL HIGH (ref 150–400)
RBC: 3.27 MIL/uL — ABNORMAL LOW (ref 4.22–5.81)
RDW: 14.2 % (ref 11.5–15.5)
WBC: 8.9 10*3/uL (ref 4.0–10.5)
nRBC: 0 % (ref 0.0–0.2)

## 2019-08-30 LAB — GLUCOSE, CAPILLARY
Glucose-Capillary: 177 mg/dL — ABNORMAL HIGH (ref 70–99)
Glucose-Capillary: 260 mg/dL — ABNORMAL HIGH (ref 70–99)
Glucose-Capillary: 263 mg/dL — ABNORMAL HIGH (ref 70–99)
Glucose-Capillary: 341 mg/dL — ABNORMAL HIGH (ref 70–99)
Glucose-Capillary: 350 mg/dL — ABNORMAL HIGH (ref 70–99)
Glucose-Capillary: 374 mg/dL — ABNORMAL HIGH (ref 70–99)

## 2019-08-30 LAB — BASIC METABOLIC PANEL
Anion gap: 10 (ref 5–15)
BUN: 22 mg/dL — ABNORMAL HIGH (ref 6–20)
CO2: 22 mmol/L (ref 22–32)
Calcium: 8.7 mg/dL — ABNORMAL LOW (ref 8.9–10.3)
Chloride: 104 mmol/L (ref 98–111)
Creatinine, Ser: 1.69 mg/dL — ABNORMAL HIGH (ref 0.61–1.24)
GFR calc Af Amer: 56 mL/min — ABNORMAL LOW (ref >60)
GFR calc non Af Amer: 48 mL/min — ABNORMAL LOW (ref >60)
Glucose, Bld: 381 mg/dL — ABNORMAL HIGH (ref 70–99)
Potassium: 4.5 mmol/L (ref 3.5–5.1)
Sodium: 136 mmol/L (ref 135–145)

## 2019-08-30 MED ORDER — INSULIN GLARGINE 100 UNIT/ML ~~LOC~~ SOLN
25.0000 [IU] | Freq: Every day | SUBCUTANEOUS | Status: DC
  Administered 2019-08-30: 25 [IU] via SUBCUTANEOUS
  Filled 2019-08-30 (×2): qty 0.25

## 2019-08-30 NOTE — Anesthesia Postprocedure Evaluation (Signed)
Anesthesia Post Note  Patient: Toriano Aikey  Procedure(s) Performed: RIght foot wound incision and drainage, debridement and irrigation, bone biopsy, application of wound VAC (Right )     Patient location during evaluation: PACU Anesthesia Type: MAC Level of consciousness: awake and sedated Pain management: pain level controlled Vital Signs Assessment: post-procedure vital signs reviewed and stable Respiratory status: spontaneous breathing Cardiovascular status: stable Postop Assessment: no apparent nausea or vomiting Anesthetic complications: no    Last Vitals:  Vitals:   08/30/19 0541 08/30/19 1025  BP: (!) 149/82 (!) 148/84  Pulse: 79 76  Resp: 18   Temp: 36.8 C   SpO2: 96%     Last Pain:  Vitals:   08/30/19 0828  TempSrc:   PainSc: 0-No pain   Pain Goal: Patients Stated Pain Goal: 0 (08/29/19 0930)                 Huston Foley

## 2019-08-30 NOTE — Consult Note (Signed)
WOC Nurse Consult Note: Discussed with Dr. March Rummage via [place] and I have requested a medium black [place] VAC dressing kit to the patient's bedside for tomorrow morning's first surgical dressing change by Dr. March Rummage.  [place] nursing team will not follow, but will remain available to this patient, the nursing and medical teams.  Please re-consult if needed. Thanks, Maudie Flakes, MSN, RN, [place], Arther Abbott  Pager# [phone]

## 2019-08-30 NOTE — Progress Notes (Signed)
Subjective:  Patient ID: Javier Hunt, male    DOB: Dec 31, 1975,  MRN: [id number]  Patient seen bedside. Denies overnight issues. Pain controlled Objective:   Vitals:   08/30/19 0541 08/30/19 1025  BP: (!) 149/82 (!) 148/84  Pulse: 79 76  Resp: 18   Temp: 98.3 F (36.8 C)   SpO2: 96%    General AA&O x3. Normal mood and affect.  Vascular Right foot and leg warm and well perfused.  Neurologic Epicritic sensation grossly diminished.  Dermatologic  Dressing intact right foot. Wound VAC on and functioning  Orthopedic: Edema right leg, decreased since yesterday   Results for orders placed or performed during the hospital encounter of 08/27/19 (from the past 24 hour(s))  Glucose, capillary     Status: Abnormal   Collection Time: 08/29/19 11:55 AM  Result Value Ref Range   Glucose-Capillary 258 (H) 70 - 99 mg/dL  Glucose, capillary     Status: Abnormal   Collection Time: 08/29/19  4:31 PM  Result Value Ref Range   Glucose-Capillary 353 (H) 70 - 99 mg/dL  Glucose, capillary     Status: Abnormal   Collection Time: 08/29/19  8:45 PM  Result Value Ref Range   Glucose-Capillary 320 (H) 70 - 99 mg/dL  Glucose, capillary     Status: Abnormal   Collection Time: 08/30/19 12:56 AM  Result Value Ref Range   Glucose-Capillary 263 (H) 70 - 99 mg/dL  CBC with Differential/Platelet     Status: Abnormal   Collection Time: 08/30/19  4:58 AM  Result Value Ref Range   WBC 8.9 4.0 - 10.5 K/uL   RBC 3.27 (L) 4.22 - 5.81 MIL/uL   Hemoglobin 8.1 (L) 13.0 - 17.0 g/dL   HCT 28.1 (L) 39.0 - 52.0 %   MCV 85.9 80.0 - 100.0 fL   MCH 24.8 (L) 26.0 - 34.0 pg   MCHC 28.8 (L) 30.0 - 36.0 g/dL   RDW 14.2 11.5 - 15.5 %   Platelets 472 (H) 150 - 400 K/uL   nRBC 0.0 0.0 - 0.2 %   Neutrophils Relative % 71 %   Neutro Abs 6.3 1.7 - 7.7 K/uL   Lymphocytes Relative 13 %   Lymphs Abs 1.2 0.7 - 4.0 K/uL   Monocytes Relative 11 %   Monocytes Absolute 0.9 0.1 - 1.0 K/uL   Eosinophils Relative 5 %   Eosinophils  Absolute 0.4 0.0 - 0.5 K/uL   Basophils Relative 0 %   Basophils Absolute 0.0 0.0 - 0.1 K/uL   Immature Granulocytes 0 %   Abs Immature Granulocytes 0.04 0.00 - 0.07 K/uL  Basic metabolic panel     Status: Abnormal   Collection Time: 08/30/19  4:58 AM  Result Value Ref Range   Sodium 136 135 - 145 mmol/L   Potassium 4.5 3.5 - 5.1 mmol/L   Chloride 104 98 - 111 mmol/L   CO2 22 22 - 32 mmol/L   Glucose, Bld 381 (H) 70 - 99 mg/dL   BUN 22 (H) 6 - 20 mg/dL   Creatinine, Ser 1.69 (H) 0.61 - 1.24 mg/dL   Calcium 8.7 (L) 8.9 - 10.3 mg/dL   GFR calc non Af Amer 48 (L) >60 mL/min   GFR calc Af Amer 56 (L) >60 mL/min   Anion gap 10 5 - 15  Glucose, capillary     Status: Abnormal   Collection Time: 08/30/19  5:41 AM  Result Value Ref Range   Glucose-Capillary 341 (H) 70 - 99 mg/dL  Glucose, capillary     Status: Abnormal   Collection Time: 08/30/19  8:12 AM  Result Value Ref Range   Glucose-Capillary 350 (H) 70 - 99 mg/dL    Assessment & Plan:  Patient was evaluated and treated and all questions answered.  Right foot diabetic ulcer with infection, concomitant Charcot, likely osteomyelitis -Patient seen and evaluated -Labs reviewed -Micro reviewd. NGTD -Likely will need at least 2 weeks Abx for severe infection. Likely osteo given wound depth to bone. Awaiting growth on cultures -NWB RLE -PT/OT [facility] dressing change tomorrow -Will continue to follow  Dispo: D/c pending cultures and abx d/c plan. Will need HHC at d/c for wound VAC and IV Abx.  Evelina Bucy, DPM  Accessible via secure chat for questions or concerns.

## 2019-08-30 NOTE — Progress Notes (Signed)
Pharmacy Antibiotic Note  Javier Hunt is a 44 y.o. male admitted on 08/27/2019 with worsening right foot wound.  Pharmacy has been consulted for vancomycin and Levaquin dosing.  Day 3 full abx, Now Afeb, WBC now wnl, SCr 1.72 > 1.69 Rule out osteo 3/26 I&D/bone biopsy, wound VAC placed, likely osteo per podiatrist (high risk for BKA), recommend tx w/ IV abx x 2wk   Plan: Levaquin 750 mg IV q 24 hours Vancomycin 1000 + 500 mg load followed by 1000 mg q24h. Goal AUC 400-550. Expected AUC: 487, SCr used: 1.72  F/U renal function, culture results, plans for antibiotics   Height: 5\' 6"  (167.6 cm) Weight: 188 lb 0.8 oz (85.3 kg) IBW/kg (Calculated) : 63.8  Temp (24hrs), Avg:98.5 F (36.9 C), Min:97.8 F (36.6 C), Max:99.3 F (37.4 C)  Recent Labs  Lab 08/27/19 1731 08/28/19 0530 08/29/19 0832 08/30/19 0458  WBC 13.4* 11.1* 10.0 8.9  CREATININE 1.72* 1.60* 1.66* 1.69*  LATICACIDVEN 0.9  --   --   --     Estimated Creatinine Clearance: 57.1 mL/min (A) (by C-G formula based on SCr of 1.69 mg/dL (H)).    Using previous weight 90.3 kg Ht: 66 inches  Allergies  Allergen Reactions  . Lisinopril Cough  . Keflex [Cephalexin] Rash    Antimicrobials this admission: 3/25 vancomycin >>  3/25 Levaquin >>   Dose adjustments this admission:  Microbiology results: 3/25 BCx: ngtd 3/25 Covid: neg 3/26 MRSA PCR: neg 3/26 TissueCx: no org on gram stain, ngtd 3/26 BoneCx: no org on gram stain, ngtd  Thank you for allowing pharmacy to be a part of this patient's care.  Minda Ditto PharmD 08/30/2019 12:47 PM

## 2019-08-30 NOTE — Progress Notes (Signed)
PROGRESS NOTE  Javier Hunt  DOB: 1976-01-04  PCP: [name], MD HDQ:[id number]  DOA: 08/27/2019 Admitted From: Home  LOS: 3 days   Chief Complaint  Patient presents with  . infected foot    right   Brief narrative: Javier Hunt is a 44 y.o. male with medical history significant of hypertension, hyperlipidemia, type 1 diabetes, GERD, CKD stage III, recent hospitalization for anemia and right foot wound. Patient presented to the ED on 3/25 from his podiatrist's office for evaluation of worsening foot infection.  Patient states he has had a foot wound since January.  Evidently he did not notice it getting worse.   He has type 1 diabetes mellitus and uses all insulin pump.    In the ED he had a temperature of 100.4, WBC count 13.4, lactic acid normal provide, BUN 29, creatinine 1.7, stable for last 2 weeks. MRI right ankle findings as below 1. Findings of advanced midfoot Charcot arthropathy with extensive fragmentation of the midfoot and inferior migration of the talus.  2. There is moderate to large ankle and midfoot effusion with synovitis. 3. Plantar surface ulceration without evidence of sinus tract or abscess. However there is findings suggestive of probable early osteomyelitis involving the calcaneus. 4. Findings which could be suggestive early osteomyelitis versus reactive marrow within the distal tibia and distal fibula. 5. Nonunited distal fibular fracture. 6. Probable nonunited small fracture of the sustentacular tali. 7. Probable partial prior tear of the spring ligament and the posterior tibiofibular ligament. 8. Partial insertional tear of the middle cord of the plantar fascia with plantar fasciitis 9. Extensive dorsal subcutaneous edema and denervation atrophy  Patient was admitted to hospitalist service. Podiatry consultation was obtained.  Subjective: Patient was seen and examined this morning.  Sitting up in bed.  Not in distress.   Pain controlled.  Right foot  with dressing on and wound VAC on. Blood sugar level running high upto 300s  Assessment/Plan: Acute osteomyelitis of right calcaneus Acute osteomyelitis of distal right tibia and distal fibula -MRI findings as above.   -Podiatry consult appreciated. -3/26, patient underwent right foot I&D, debridement and irrigation, bone biopsy and wound VAC application. -Currently on broad-spectrum IV antibiotics.  Long-term IV antibiotics versus oral antibiotic at discharge.  Will discuss with podiatry. -Waiting for culture report.  -WBC count improved  Anemia:  -Based on work-up done during recent hospitalization, anemia is due to combination of iron deficiency and chronic disease/CKD.  He was given 2 units of blood during his recent hospitalization.  His FOBT was negative at that time.  -Hemoglobin was 8.2 on admission, 8.1 today.  No active bleeding.  Continue iron supplement.   CKD stage III:  Creatinine 1.7, stable compared to recent labs.  No prior labs for comparison. Continue to monitor renal function during this hospitalization and ensure outpatient nephrology follow-up.  Type 1 diabetes: A1c 6.9 on 3/10 -Uses insulin pump at home -Currently on Lantus and sliding scale insulin.  Patient received 15 units of Lantus yesterday.  Blood sugar level still remains elevated.  Increased Lantus dose to 25 units for this morning.  Continue to monitor Accu-Cheks.  Continue sliding scale insulin  Hypertension: Blood pressure slightly elevated.  Continue home amlodipine, metoprolol  Hyperlipidemia: Continue home Lipitor, Zetia  GERD: Continue PPI  Code Status:  Full code DVT prophylaxis:  SCDs Antimicrobials:  IV vancomycin, IV Levaquin Fluid: Continue saline at 75 mL/h Diet: Diabetic diet post procedure Mobility: Needs PT eval post procedure Family Communication:  None at bedside Discharge plan:  Anticipated date and disposition: Probably next 2 to 3 days in the hospital Barriers: Waiting  wound report.  Podiatry follow-up.  Consultants:  Podiatry   Antimicrobials: Anti-infectives (From admission, onward)   Start     Dose/Rate Route Frequency Ordered Stop   08/28/19 2200  vancomycin (VANCOCIN) IVPB 1000 mg/200 mL premix     1,000 mg 200 mL/hr over 60 Minutes Intravenous Every 24 hours 08/27/19 2045     08/28/19 2000  levofloxacin (LEVAQUIN) IVPB 750 mg     750 mg 100 mL/hr over 90 Minutes Intravenous Every 24 hours 08/27/19 2045     08/27/19 2300  vancomycin (VANCOREADY) IVPB 500 mg/100 mL     500 mg 100 mL/hr over 60 Minutes Intravenous  Once 08/27/19 2045 08/28/19 0014   08/27/19 2045  vancomycin (VANCOREADY) IVPB 500 mg/100 mL  Status:  Discontinued     500 mg 100 mL/hr over 60 Minutes Intravenous  Once 08/27/19 2037 08/27/19 2303   08/27/19 1815  vancomycin (VANCOCIN) IVPB 1000 mg/200 mL premix     1,000 mg 200 mL/hr over 60 Minutes Intravenous  Once 08/27/19 1810 08/27/19 2041   08/27/19 1815  levofloxacin (LEVAQUIN) IVPB 750 mg     750 mg 100 mL/hr over 90 Minutes Intravenous  Once 08/27/19 1810 08/27/19 2212        Code Status: Full Code   Diet Order            Diet Carb Modified Fluid consistency: Thin; Room service appropriate? Yes  Diet effective now              Infusions:  . levofloxacin (LEVAQUIN) IV 750 mg (08/29/19 2054)  . vancomycin 1,000 mg (08/29/19 2149)    Scheduled Meds: . amLODipine  5 mg Oral Daily  . atorvastatin  40 mg Oral q1800  . ezetimibe  10 mg Oral Daily  . feeding supplement (ENSURE ENLIVE)  237 mL Oral BID BM  . ferrous sulfate  325 mg Oral Q breakfast  . insulin aspart  0-5 Units Subcutaneous QHS  . insulin aspart  0-9 Units Subcutaneous TID WC  . insulin glargine  25 Units Subcutaneous Daily  . metoprolol succinate  50 mg Oral Daily  . pantoprazole  40 mg Oral Daily  . sodium chloride flush  3 mL Intravenous Once  . [START ON 08/31/2019] Vitamin D (Ergocalciferol)  50,000 Units Oral Once per day on Mon Thu     PRN meds: acetaminophen **OR** acetaminophen, senna-docusate   Objective: Vitals:   08/30/19 1025 08/30/19 1311  BP: (!) 148/84 (!) 149/85  Pulse: 76 76  Resp:  18  Temp:  97.9 F (36.6 C)  SpO2:  98%    Intake/Output Summary (Last 24 hours) at 08/30/2019 1412 Last data filed at 08/30/2019 1032 Gross per 24 hour  Intake 800.04 ml  Output 2250 ml  Net -1449.96 ml   Filed Weights   08/27/19 2049 08/27/19 2242  Weight: 88.9 kg 85.3 kg   Weight change:  Body mass index is 30.35 kg/m.   Physical Exam: General exam: Appears calm and comfortable.  Not in physical distress. Skin: No rashes, lesions or ulcers. HEENT: Atraumatic, normocephalic, supple neck, no obvious bleeding Lungs: Clear to auscultation bilaterally CVS: Regular rate and rhythm, no murmur GI/Abd soft, nontender, nondistended, bowel sounds present CNS: Alert, awake, oriented x3 Psychiatry: Mood appropriate Extremities: Right foot with bandaged wound.  Wound VAC on.  Data Review: I have personally  reviewed the laboratory data and studies available.  Recent Labs  Lab 08/27/19 1731 08/28/19 0530 08/29/19 0832 08/30/19 0458  WBC 13.4* 11.1* 10.0 8.9  NEUTROABS 10.4*  --  7.2 6.3  HGB 8.2* 7.7* 7.7* 8.1*  HCT 27.3* 25.9* 25.2* 28.1*  MCV 84.0 83.3 84.8 85.9  PLT 475* 433* 458* 472*   Recent Labs  Lab 08/27/19 1731 08/28/19 0530 08/29/19 0832 08/30/19 0458  NA 138 136 138 136  K 4.3 4.4 4.0 4.5  CL 106 105 106 104  CO2 23 21* 25 22  GLUCOSE 147* 221* 127* 381*  BUN 29* 27* 22* 22*  CREATININE 1.72* 1.60* 1.66* 1.69*  CALCIUM 8.7* 8.7* 8.4* 8.7*    Signed, Terrilee Croak, MD [facility] Pager: [phone] (Secure Chat preferred). 08/30/2019

## 2019-08-31 LAB — CBC WITH DIFFERENTIAL/PLATELET
Abs Immature Granulocytes: 0.05 10*3/uL (ref 0.00–0.07)
Basophils Absolute: 0 10*3/uL (ref 0.0–0.1)
Basophils Relative: 0 %
Eosinophils Absolute: 0.6 10*3/uL — ABNORMAL HIGH (ref 0.0–0.5)
Eosinophils Relative: 7 %
HCT: 27.2 % — ABNORMAL LOW (ref 39.0–52.0)
Hemoglobin: 8.2 g/dL — ABNORMAL LOW (ref 13.0–17.0)
Immature Granulocytes: 1 %
Lymphocytes Relative: 16 %
Lymphs Abs: 1.4 10*3/uL (ref 0.7–4.0)
MCH: 25.2 pg — ABNORMAL LOW (ref 26.0–34.0)
MCHC: 30.1 g/dL (ref 30.0–36.0)
MCV: 83.4 fL (ref 80.0–100.0)
Monocytes Absolute: 0.9 10*3/uL (ref 0.1–1.0)
Monocytes Relative: 10 %
Neutro Abs: 5.7 10*3/uL (ref 1.7–7.7)
Neutrophils Relative %: 66 %
Platelets: 475 10*3/uL — ABNORMAL HIGH (ref 150–400)
RBC: 3.26 MIL/uL — ABNORMAL LOW (ref 4.22–5.81)
RDW: 14.2 % (ref 11.5–15.5)
WBC: 8.5 10*3/uL (ref 4.0–10.5)
nRBC: 0 % (ref 0.0–0.2)

## 2019-08-31 LAB — SURGICAL PATHOLOGY

## 2019-08-31 LAB — GLUCOSE, CAPILLARY
Glucose-Capillary: 265 mg/dL — ABNORMAL HIGH (ref 70–99)
Glucose-Capillary: 296 mg/dL — ABNORMAL HIGH (ref 70–99)
Glucose-Capillary: 313 mg/dL — ABNORMAL HIGH (ref 70–99)
Glucose-Capillary: 294 mg/dL — ABNORMAL HIGH (ref 70–99)

## 2019-08-31 LAB — BASIC METABOLIC PANEL
Anion gap: 8 (ref 5–15)
BUN: 18 mg/dL (ref 6–20)
CO2: 25 mmol/L (ref 22–32)
Calcium: 8.9 mg/dL (ref 8.9–10.3)
Chloride: 102 mmol/L (ref 98–111)
Creatinine, Ser: 1.56 mg/dL — ABNORMAL HIGH (ref 0.61–1.24)
GFR calc Af Amer: >60 mL/min (ref >60)
GFR calc non Af Amer: 53 mL/min — ABNORMAL LOW (ref >60)
Glucose, Bld: 256 mg/dL — ABNORMAL HIGH (ref 70–99)
Potassium: 4 mmol/L (ref 3.5–5.1)
Sodium: 135 mmol/L (ref 135–145)

## 2019-08-31 LAB — VANCOMYCIN, TROUGH: Vancomycin Tr: 9 ug/mL — ABNORMAL LOW (ref 15–20)

## 2019-08-31 MED ORDER — INSULIN GLARGINE 100 UNIT/ML ~~LOC~~ SOLN
30.0000 [IU] | Freq: Every day | SUBCUTANEOUS | Status: DC
  Administered 2019-08-31: 30 [IU] via SUBCUTANEOUS
  Filled 2019-08-31 (×2): qty 0.3

## 2019-08-31 NOTE — Progress Notes (Signed)
PROGRESS NOTE  Javier Hunt  DOB: Jun 04, 1976  PCP: [name], MD INO:[id number]  DOA: 08/27/2019 Admitted From: Home  LOS: 4 days   Chief Complaint  Patient presents with  . infected foot    right   Brief narrative: Javier Hunt is a 44 y.o. male with medical history significant of hypertension, hyperlipidemia, type 1 diabetes, GERD, CKD stage III, recent hospitalization for anemia and right foot wound. Patient presented to the ED on 3/25 from his podiatrist's office for evaluation of worsening foot infection.  Patient states he has had a foot wound since January.  Evidently he did not notice it getting worse.   He has type 1 diabetes mellitus and uses all insulin pump.    In the ED he had a temperature of 100.4, WBC count 13.4, lactic acid normal provide, BUN 29, creatinine 1.7, stable for last 2 weeks. MRI right ankle findings as below 1. Findings of advanced midfoot Charcot arthropathy with extensive fragmentation of the midfoot and inferior migration of the talus.  2. There is moderate to large ankle and midfoot effusion with synovitis. 3. Plantar surface ulceration without evidence of sinus tract or abscess. However there is findings suggestive of probable early osteomyelitis involving the calcaneus. 4. Findings which could be suggestive early osteomyelitis versus reactive marrow within the distal tibia and distal fibula. 5. Nonunited distal fibular fracture. 6. Probable nonunited small fracture of the sustentacular tali. 7. Probable partial prior tear of the spring ligament and the posterior tibiofibular ligament. 8. Partial insertional tear of the middle cord of the plantar fascia with plantar fasciitis 9. Extensive dorsal subcutaneous edema and denervation atrophy  Patient was admitted to hospitalist service. Podiatry consultation was obtained.  Subjective: Patient was seen and examined this morning.  Sitting up in bed.  Not in distress.   Pain controlled.  Right foot  with dressing on.  Wound VAC off today. Blood sugar trend improving but still remains in 200s.  Assessment/Plan: Acute osteomyelitis of right calcaneus Acute osteomyelitis of distal right tibia and distal fibula -MRI findings as above.   -Podiatry consult appreciated. -3/26, patient underwent right foot I&D, debridement and irrigation, bone biopsy and wound VAC application. -Pending culture report.  Currently on broad-spectrum IV antibiotics.  May need long-term IV antibiotics at discharge.  Once culture report is out, will put order for PICC line.  -WBC count improved.  No fever episode.  Anemia:  -Based on work-up done during recent hospitalization, anemia is due to combination of iron deficiency and chronic disease/CKD.  He was given 2 units of blood during his recent hospitalization.  His FOBT was negative at that time.  -Hemoglobin was 8.2 on admission, remains 8.2 today.  No active bleeding.  Continue iron supplement.   CKD stage III:  Creatinine 1.7, stable compared to recent labs.  No prior labs for comparison. Continue to monitor renal function during this hospitalization and ensure outpatient nephrology follow-up.  Type 1 diabetes: A1c 6.9 on 3/10 -Uses insulin pump at home -Currently on Lantus and sliding scale insulin.  Patient received 25 units of insulin yesterday morning.  Blood sugar still remains elevated 200s.  I increased Lantus to 30 units for this morning. Continue to monitor Accu-Cheks.  Continue sliding scale insulin  Hypertension: Blood pressure slightly elevated.  Continue home amlodipine, metoprolol  Hyperlipidemia: Continue home Lipitor, Zetia  GERD: Continue PPI  Code Status:  Full code DVT prophylaxis:  SCDs Antimicrobials:  IV vancomycin, IV Levaquin Fluid: Fluid is stopped. Diet: Diabetic  diet.   Mobility: Needs PT eval post procedure Family Communication:  None at bedside Discharge plan:  Anticipated date and disposition: Probably next 2 to 3  days in the hospital Barriers: Waiting wound report.  Podiatry follow-up.  Consultants:  Podiatry   Antimicrobials: Anti-infectives (From admission, onward)   Start     Dose/Rate Route Frequency Ordered Stop   08/28/19 2200  vancomycin (VANCOCIN) IVPB 1000 mg/200 mL premix     1,000 mg 200 mL/hr over 60 Minutes Intravenous Every 24 hours 08/27/19 2045     08/28/19 2000  levofloxacin (LEVAQUIN) IVPB 750 mg     750 mg 100 mL/hr over 90 Minutes Intravenous Every 24 hours 08/27/19 2045     08/27/19 2300  vancomycin (VANCOREADY) IVPB 500 mg/100 mL     500 mg 100 mL/hr over 60 Minutes Intravenous  Once 08/27/19 2045 08/28/19 0014   08/27/19 2045  vancomycin (VANCOREADY) IVPB 500 mg/100 mL  Status:  Discontinued     500 mg 100 mL/hr over 60 Minutes Intravenous  Once 08/27/19 2037 08/27/19 2303   08/27/19 1815  vancomycin (VANCOCIN) IVPB 1000 mg/200 mL premix     1,000 mg 200 mL/hr over 60 Minutes Intravenous  Once 08/27/19 1810 08/27/19 2041   08/27/19 1815  levofloxacin (LEVAQUIN) IVPB 750 mg     750 mg 100 mL/hr over 90 Minutes Intravenous  Once 08/27/19 1810 08/27/19 2212        Code Status: Full Code   Diet Order            Diet Carb Modified Fluid consistency: Thin; Room service appropriate? Yes  Diet effective now              Infusions:  . levofloxacin (LEVAQUIN) IV 750 mg (08/30/19 2019)  . vancomycin Stopped (08/30/19 2307)    Scheduled Meds: . amLODipine  5 mg Oral Daily  . atorvastatin  40 mg Oral q1800  . ezetimibe  10 mg Oral Daily  . feeding supplement (ENSURE ENLIVE)  237 mL Oral BID BM  . ferrous sulfate  325 mg Oral Q breakfast  . insulin aspart  0-5 Units Subcutaneous QHS  . insulin aspart  0-9 Units Subcutaneous TID WC  . insulin glargine  30 Units Subcutaneous Daily  . metoprolol succinate  50 mg Oral Daily  . pantoprazole  40 mg Oral Daily  . sodium chloride flush  3 mL Intravenous Once  . Vitamin D (Ergocalciferol)  50,000 Units Oral Once per  day on Mon Thu    PRN meds: acetaminophen **OR** acetaminophen, senna-docusate   Objective: Vitals:   08/31/19 0443 08/31/19 1020  BP: (!) 144/83 (!) 160/90  Pulse: 72 76  Resp: 18 18  Temp: 98.3 F (36.8 C) 97.9 F (36.6 C)  SpO2: 95% 97%    Intake/Output Summary (Last 24 hours) at 08/31/2019 1054 Last data filed at 08/31/2019 1000 Gross per 24 hour  Intake 1100 ml  Output 1425 ml  Net -325 ml   Filed Weights   08/27/19 2049 08/27/19 2242  Weight: 88.9 kg 85.3 kg   Weight change:  Body mass index is 30.35 kg/m.   Physical Exam: General exam: Appears calm and comfortable.  Not in physical distress. Skin: No rashes, lesions or ulcers. HEENT: Atraumatic, normocephalic, supple neck, no obvious bleeding Lungs: Clear to auscultation bilaterally, no crackles or wheezing CVS: Regular rate and rhythm, no murmur GI/Abd soft, nontender, nondistended, bowel sounds present CNS: Alert, awake, oriented x3 Psychiatry: Mood appropriate Extremities:  Right foot with bandaged wound.   Data Review: I have personally reviewed the laboratory data and studies available.  Recent Labs  Lab 08/27/19 1731 08/28/19 0530 08/29/19 0832 08/30/19 0458 08/31/19 0510  WBC 13.4* 11.1* 10.0 8.9 8.5  NEUTROABS 10.4*  --  7.2 6.3 5.7  HGB 8.2* 7.7* 7.7* 8.1* 8.2*  HCT 27.3* 25.9* 25.2* 28.1* 27.2*  MCV 84.0 83.3 84.8 85.9 83.4  PLT 475* 433* 458* 472* 475*   Recent Labs  Lab 08/27/19 1731 08/28/19 0530 08/29/19 0832 08/30/19 0458 08/31/19 0510  NA 138 136 138 136 135  K 4.3 4.4 4.0 4.5 4.0  CL 106 105 106 104 102  CO2 23 21* 25 22 25   GLUCOSE 147* 221* 127* 381* 256*  BUN 29* 27* 22* 22* 18  CREATININE 1.72* 1.60* 1.66* 1.69* 1.56*  CALCIUM 8.7* 8.7* 8.4* 8.7* 8.9    Signed, Terrilee Croak, MD [facility] Pager: [phone] (Secure Chat preferred). 08/31/2019

## 2019-08-31 NOTE — Evaluation (Signed)
Physical Therapy Evaluation Patient Details Name: Javier Hunt MRN: [id number] DOB: [birth year] Today's Date: 08/31/2019   History of Present Illness  44 yo male s/p I&D R heel 3/26 2* diabetic ulcer infection. Hx of DM, Charcot foot, CKD, anemia  Clinical Impression  On eval, pt was Supv level for mobility. He walked ~15 feet x 2 with use of a RW. No pain during session. Will plan to see pt again on tomorrow to continue gait training (RW vs crutches). He mobilizes well with NWB status. Do not anticipate any f/u PT needs after discharge.     Follow Up Recommendations No PT follow up;Supervision for mobility/OOB    Equipment Recommendations  Rolling walker with 5" wheels    Recommendations for Other Services       Precautions / Restrictions Precautions Precautions: Fall Restrictions Weight Bearing Restrictions: Yes RLE Weight Bearing: Non weight bearing Other Position/Activity Restrictions: CAM boot in room      Mobility  Bed Mobility Overal bed mobility: Modified Independent                Transfers Overall transfer level: Needs assistance Equipment used: Rolling walker (2 wheeled) Transfers: Sit to/from Stand Sit to Stand: Supervision         General transfer comment: VCs safety, hand placement, and cues to adhere to NWB status  Ambulation/Gait Ambulation/Gait assistance: Supervision Gait Distance (Feet): 15 Feet(x2) Assistive device: Rolling walker (2 wheeled) Gait Pattern/deviations: Step-to pattern     General Gait Details: No LOB with RW use. Good adherence to NWB.  Stairs            Wheelchair Mobility    Modified Rankin (Stroke Patients Only)       Balance Overall balance assessment: No apparent balance deficits (not formally assessed)                                           Pertinent Vitals/Pain Pain Assessment: 0-10 Pain Score: 0-No pain    Home Living Family/patient expects to be discharged to:: Private  residence Living Arrangements: Spouse/significant other Available Help at Discharge: Family Type of Home: House Home Access: Stairs to enter Entrance Stairs-Rails: None Entrance Stairs-Number of Steps: 1 threshold Home Layout: Bed/bath upstairs;Two level Home Equipment: None      Prior Function Level of Independence: Independent               Hand Dominance        Extremity/Trunk Assessment        Lower Extremity Assessment Lower Extremity Assessment: RLE deficits/detail RLE Deficits / Details: R heel with foam dressing in place-wound vac not in room but not currently on pt at time of eval RLE Sensation: history of peripheral neuropathy    Cervical / Trunk Assessment Cervical / Trunk Assessment: Normal  Communication   Communication: No difficulties  Cognition Arousal/Alertness: Awake/alert Behavior During Therapy: WFL for tasks assessed/performed Overall Cognitive Status: Within Functional Limits for tasks assessed                                        General Comments      Exercises     Assessment/Plan    PT Assessment Patient needs continued PT services  PT Problem List Decreased mobility;Decreased knowledge of precautions;Decreased knowledge of use  of DME;Decreased safety awareness       PT Treatment Interventions DME instruction;Gait training;Therapeutic activities;Therapeutic exercise;Patient/family education;Balance training;Functional mobility training    PT Goals (Current goals can be found in the Care Plan section)  Acute Rehab PT Goals Patient Stated Goal: none stated PT Goal Formulation: With patient Time For Goal Achievement: 09/14/19 Potential to Achieve Goals: Good    Frequency Min 3X/week   Barriers to discharge        Co-evaluation               AM-PAC PT "6 Clicks" Mobility  Outcome Measure Help needed turning from your back to your side while in a flat bed without using bedrails?: None Help needed  moving from lying on your back to sitting on the side of a flat bed without using bedrails?: None Help needed moving to and from a bed to a chair (including a wheelchair)?: A Little Help needed standing up from a chair using your arms (e.g., wheelchair or bedside chair)?: A Little Help needed to walk in hospital room?: A Little Help needed climbing 3-5 steps with a railing? : A Little 6 Click Score: 20    End of Session   Activity Tolerance: Patient tolerated treatment well Patient left: in bed;with call bell/phone within reach   PT Visit Diagnosis: Difficulty in walking, not elsewhere classified (R26.2)    Time: 4081-4481 PT Time Calculation (min) (ACUTE ONLY): 14 min   Charges:   PT Evaluation $PT Eval Low Complexity: 1 Low             Kalib Bhagat P, PT Acute Rehabilitation

## 2019-08-31 NOTE — Progress Notes (Signed)
Subjective:  Patient ID: Javier Hunt, male    DOB: 1976-01-08,  MRN: [id number]  Patient seen bedside. Pain controlled. Denies overnight events. Objective:   Vitals:   08/31/19 0443 08/31/19 1020  BP: (!) 144/83 (!) 160/90  Pulse: 72 76  Resp: 18 18  Temp: 98.3 F (36.8 C) 97.9 F (36.6 C)  SpO2: 95% 97%   General AA&O x3. Normal mood and affect.  Vascular Right foot and leg warm and well perfused.  Neurologic Epicritic sensation grossly diminished.  Dermatologic  Dressing intact right foot. Wound VAC on and functioning with ss drainage in cannister.  Orthopedic: Edema right leg, decreased since yesterday   Results for orders placed or performed during the hospital encounter of 08/27/19 (from the past 24 hour(s))  Glucose, capillary     Status: Abnormal   Collection Time: 08/30/19  5:44 PM  Result Value Ref Range   Glucose-Capillary 177 (H) 70 - 99 mg/dL  Glucose, capillary     Status: Abnormal   Collection Time: 08/30/19  9:24 PM  Result Value Ref Range   Glucose-Capillary 260 (H) 70 - 99 mg/dL  CBC with Differential/Platelet     Status: Abnormal   Collection Time: 08/31/19  5:10 AM  Result Value Ref Range   WBC 8.5 4.0 - 10.5 K/uL   RBC 3.26 (L) 4.22 - 5.81 MIL/uL   Hemoglobin 8.2 (L) 13.0 - 17.0 g/dL   HCT 27.2 (L) 39.0 - 52.0 %   MCV 83.4 80.0 - 100.0 fL   MCH 25.2 (L) 26.0 - 34.0 pg   MCHC 30.1 30.0 - 36.0 g/dL   RDW 14.2 11.5 - 15.5 %   Platelets 475 (H) 150 - 400 K/uL   nRBC 0.0 0.0 - 0.2 %   Neutrophils Relative % 66 %   Neutro Abs 5.7 1.7 - 7.7 K/uL   Lymphocytes Relative 16 %   Lymphs Abs 1.4 0.7 - 4.0 K/uL   Monocytes Relative 10 %   Monocytes Absolute 0.9 0.1 - 1.0 K/uL   Eosinophils Relative 7 %   Eosinophils Absolute 0.6 (H) 0.0 - 0.5 K/uL   Basophils Relative 0 %   Basophils Absolute 0.0 0.0 - 0.1 K/uL   Immature Granulocytes 1 %   Abs Immature Granulocytes 0.05 0.00 - 0.07 K/uL  Basic metabolic panel     Status: Abnormal   Collection Time:  08/31/19  5:10 AM  Result Value Ref Range   Sodium 135 135 - 145 mmol/L   Potassium 4.0 3.5 - 5.1 mmol/L   Chloride 102 98 - 111 mmol/L   CO2 25 22 - 32 mmol/L   Glucose, Bld 256 (H) 70 - 99 mg/dL   BUN 18 6 - 20 mg/dL   Creatinine, Ser 1.56 (H) 0.61 - 1.24 mg/dL   Calcium 8.9 8.9 - 10.3 mg/dL   GFR calc non Af Amer 53 (L) >60 mL/min   GFR calc Af Amer >60 >60 mL/min   Anion gap 8 5 - 15  Glucose, capillary     Status: Abnormal   Collection Time: 08/31/19  7:37 AM  Result Value Ref Range   Glucose-Capillary 265 (H) 70 - 99 mg/dL  Glucose, capillary     Status: Abnormal   Collection Time: 08/31/19 11:58 AM  Result Value Ref Range   Glucose-Capillary 296 (H) 70 - 99 mg/dL    Assessment & Plan:  Patient was evaluated and treated and all questions answered.  Right foot diabetic ulcer with infection, concomitant Charcot,  likely osteomyelitis -Patient seen and evaluated -Labs reviewed. WBC normalized. -Micro reviewd. NGTD x 3 days. -Wound VAC removed. -Likely will need at least 2 weeks Abx for severe infection. Likely osteo given wound depth to bone. Awaiting growth on cultures -NWB RLE -PT/OT -WCT to reapply vac today  Dispo: D/c pending cultures and abx d/c plan. Will need HHC at d/c for wound VAC and IV Abx.  Evelina Bucy, DPM  Accessible via secure chat for questions or concerns.

## 2019-09-01 LAB — CULTURE, BLOOD (ROUTINE X 2)
Culture: NO GROWTH
Culture: NO GROWTH
Special Requests: ADEQUATE

## 2019-09-01 LAB — CBC WITH DIFFERENTIAL/PLATELET
Abs Immature Granulocytes: 0.05 10*3/uL (ref 0.00–0.07)
Basophils Absolute: 0 10*3/uL (ref 0.0–0.1)
Basophils Relative: 0 %
Eosinophils Absolute: 0.6 10*3/uL — ABNORMAL HIGH (ref 0.0–0.5)
Eosinophils Relative: 6 %
HCT: 28.4 % — ABNORMAL LOW (ref 39.0–52.0)
Hemoglobin: 8.7 g/dL — ABNORMAL LOW (ref 13.0–17.0)
Immature Granulocytes: 1 %
Lymphocytes Relative: 15 %
Lymphs Abs: 1.3 10*3/uL (ref 0.7–4.0)
MCH: 25.4 pg — ABNORMAL LOW (ref 26.0–34.0)
MCHC: 30.6 g/dL (ref 30.0–36.0)
MCV: 83 fL (ref 80.0–100.0)
Monocytes Absolute: 0.9 10*3/uL (ref 0.1–1.0)
Monocytes Relative: 10 %
Neutro Abs: 6.1 10*3/uL (ref 1.7–7.7)
Neutrophils Relative %: 68 %
Platelets: 525 10*3/uL — ABNORMAL HIGH (ref 150–400)
RBC: 3.42 MIL/uL — ABNORMAL LOW (ref 4.22–5.81)
RDW: 14.1 % (ref 11.5–15.5)
WBC: 9 10*3/uL (ref 4.0–10.5)
nRBC: 0 % (ref 0.0–0.2)

## 2019-09-01 LAB — GLUCOSE, CAPILLARY
Glucose-Capillary: 306 mg/dL — ABNORMAL HIGH (ref 70–99)
Glucose-Capillary: 317 mg/dL — ABNORMAL HIGH (ref 70–99)
Glucose-Capillary: 340 mg/dL — ABNORMAL HIGH (ref 70–99)
Glucose-Capillary: 327 mg/dL — ABNORMAL HIGH (ref 70–99)

## 2019-09-01 LAB — BASIC METABOLIC PANEL
Anion gap: 10 (ref 5–15)
BUN: 19 mg/dL (ref 6–20)
CO2: 26 mmol/L (ref 22–32)
Calcium: 9 mg/dL (ref 8.9–10.3)
Chloride: 101 mmol/L (ref 98–111)
Creatinine, Ser: 1.72 mg/dL — ABNORMAL HIGH (ref 0.61–1.24)
GFR calc Af Amer: 55 mL/min — ABNORMAL LOW (ref >60)
GFR calc non Af Amer: 47 mL/min — ABNORMAL LOW (ref >60)
Glucose, Bld: 363 mg/dL — ABNORMAL HIGH (ref 70–99)
Potassium: 4.1 mmol/L (ref 3.5–5.1)
Sodium: 137 mmol/L (ref 135–145)

## 2019-09-01 LAB — VANCOMYCIN, PEAK: Vancomycin Pk: 27 ug/mL — ABNORMAL LOW (ref 30–40)

## 2019-09-01 MED ORDER — SACCHAROMYCES BOULARDII 250 MG PO CAPS: 250.0000 mg | ORAL_CAPSULE | Freq: Two times a day (BID) | ORAL | 0 refills | Status: AC

## 2019-09-01 MED ORDER — INSULIN ASPART 100 UNIT/ML ~~LOC~~ SOLN
0.0000 [IU] | Freq: Three times a day (TID) | SUBCUTANEOUS | Status: DC
  Administered 2019-09-01 (×2): 11 [IU] via SUBCUTANEOUS

## 2019-09-01 MED ORDER — CIPROFLOXACIN HCL 500 MG PO TABS: 500.0000 mg | ORAL_TABLET | Freq: Two times a day (BID) | ORAL | 0 refills | Status: AC

## 2019-09-01 MED ORDER — INSULIN ASPART 100 UNIT/ML ~~LOC~~ SOLN: 0.0000 [IU] | Freq: Every day | SUBCUTANEOUS | Status: DC

## 2019-09-01 MED ORDER — INSULIN GLARGINE 100 UNIT/ML ~~LOC~~ SOLN
35.0000 [IU] | Freq: Every day | SUBCUTANEOUS | Status: DC
  Administered 2019-09-01: 35 [IU] via SUBCUTANEOUS
  Filled 2019-09-01: qty 0.35

## 2019-09-01 MED ORDER — VANCOMYCIN HCL 1250 MG/250ML IV SOLN
1250.0000 mg | INTRAVENOUS | Status: DC
  Filled 2019-09-01: qty 250

## 2019-09-01 MED ORDER — CLINDAMYCIN HCL 300 MG PO CAPS: 300.0000 mg | ORAL_CAPSULE | Freq: Three times a day (TID) | ORAL | 0 refills | Status: AC

## 2019-09-01 MED ORDER — LOSARTAN POTASSIUM 50 MG PO TABS
50.0000 mg | ORAL_TABLET | Freq: Every day | ORAL | Status: DC
  Administered 2019-09-01: 50 mg via ORAL
  Filled 2019-09-01: qty 1

## 2019-09-01 NOTE — Discharge Summary (Signed)
Physician Discharge Summary  Javier Hunt JOI:[id number] DOB: [birth year] DOA: 08/27/2019  PCP: [name], MD  Admit date: 08/27/2019 Discharge date: 09/01/2019  Admitted From: Home Discharge disposition: Home with home health RN   Code Status: Full Code  Diet Recommendation: Diabetic diet  Recommendations for Outpatient Follow-Up:   1. Follow-up with podiatry as an outpatient  Discharge Diagnosis:   Principal Problem:   Diabetic foot ulcer ([place]) Active Problems:   Charcot foot due to diabetes mellitus ([place])   Edema of right lower extremity   Anemia   CKD (chronic kidney disease), stage III   Acute osteomyelitis of right calcaneus ([facility])   History of Present Illness / Brief narrative:  Javier Hunt a 44 y.o.malewith medical history significant ofhypertension, hyperlipidemia,type 1 diabetes, GERD, CKD stage III, recent hospitalization for anemia and right foot wound. Patient presented to the ED on 3/25 from his podiatrist's office for evaluation of worsening foot infection.Patient states he has had a foot wound since January.  Evidently he did not notice it getting worse.   He has type 1 diabetes mellitus and uses all insulin pump.    In the ED he had a temperature of 100.4, WBC count 13.4, lactic acid normal provide, BUN 29, creatinine 1.7, stable for last 2 weeks. MRI right ankle findings as below 1. Findings of advanced midfoot Charcot arthropathy with extensive fragmentation of the midfoot and inferior migration of the talus.  2. There is moderate to large ankle and midfoot effusion with synovitis. 3. Plantar surface ulceration without evidence of sinus tract or abscess. However there is findings suggestive of probable early osteomyelitis involving the calcaneus. 4. Findings which could be suggestive early osteomyelitis versus reactive marrow within the distal tibia and distal fibula. 5. Nonunited distal fibular fracture. 6. Probable nonunited small fracture of  the sustentacular tali. 7. Probable partial prior tear of the spring ligament and the posterior tibiofibular ligament. 8. Partial insertional tear of the middle cord of the plantar fascia with plantar fasciitis 9. Extensive dorsal subcutaneous edema and denervation atrophy  Patient was admitted to hospitalist service. Podiatry consultation was obtained.  Hospital Course:  Acute osteomyelitis of right calcaneus Acute osteomyelitis of distal right tibia and distal fibula -MRI findings as above.   -Podiatry consult appreciated. -Patient was started on broad-spectrum antibiotics. -3/26, patient underwent right foot I&D, debridement and irrigation, bone biopsy and wound VAC application. -Wound culture did not show any growth in 4 days.   -WBC count improved, no fever.   -Discussed with podiatrist Dr. March Rummage.  Plan to discharge on 1 more week of clindamycin and ciprofloxacin.  I would also put him on probiotics for the duration.   -Podiatry, patient needs wound VAC at discharge.  Anemia: -Based on work-up done during recent hospitalization, anemia is due to combination of iron deficiency and chronic disease/CKD. He was given 2 units of blood during his recent hospitalization. His FOBT was negative at that time.  -Hemoglobin was 8.2 on admission, remains 8.2 today.  No active bleeding.  Continue iron supplement.   CKD stage III: Creatinine 1.7, stable compared to recent labs.   Type 1 diabetes:A1c 6.9 on 3/10 -Uses insulin pump at home -In the hospital, insulin pump was held.  He was kept on insulin Lantus and sliding scale. -Post discharge, patient will resume insulin pump.  Hypertension:  -Continue home amlodipine, metoprolol and olmesartan.  Hyperlipidemia: Continue home Lipitor, Zetia  GERD: Continue PPI  Code Status: Full code  Stable for discharge to home  today.  Subjective:  Seen and examined this morning.  Young African-American male.  Not in distress.  No  new symptoms.  Feels ready to go home  Discharge Exam:   Vitals:   08/31/19 1020 08/31/19 2102 09/01/19 0559 09/01/19 1000  BP: (!) 160/90 (!) 147/81 (!) 155/82 122/83  Pulse: 76 75 75 77  Resp: 18 20 18 18   Temp: 97.9 F (36.6 C) 98.4 F (36.9 C) 97.8 F (36.6 C) 98 F (36.7 C)  TempSrc: Oral Oral Oral Oral  SpO2: 97% 92% 95% 98%  Weight:      Height:        Body mass index is 30.35 kg/m.  General exam: Appears calm and comfortable.  Skin: No rashes, lesions or ulcers. HEENT: Atraumatic, normocephalic, supple neck, no obvious bleeding Lungs: Clear to auscultation bilaterally CVS: Regular rate and rhythm, no murmur GI/Abd soft, nontender, nondistended, bowel sound present CNS: Alert, awake, oriented x3 Psychiatry: Mood appropriate Extremities: Right leg pedal edema improving.  Right foot wound has dressing on.  Discharge Instructions:  Wound care: Wound VAC Discharge Instructions    Call MD for:  redness, tenderness, or signs of infection (pain, swelling, redness, odor or green/yellow discharge around incision site)   Complete by: As directed    Call MD for:  severe uncontrolled pain   Complete by: As directed    Call MD for:  temperature >100.4   Complete by: As directed    Diet Carb Modified   Complete by: As directed    Increase activity slowly   Complete by: As directed      Follow-up Information    [name], MD Follow up.   Specialty: Internal Medicine Contact information: [place] [address] [place] [address] [phone]          Allergies as of 09/01/2019      Reactions   Lisinopril Cough   Keflex [cephalexin] Rash      Medication List    TAKE these medications   amLODipine 5 MG tablet Commonly known as: NORVASC Take 5 mg by mouth daily.   aspirin 81 MG tablet Take 81 mg by mouth daily.   atorvastatin 40 MG tablet Commonly known as: LIPITOR Take 40 mg by mouth daily at 6 PM.   ciprofloxacin 500 MG tablet Commonly  known as: CIPRO Take 1 tablet (500 mg total) by mouth 2 (two) times daily for 7 days.   clindamycin 300 MG capsule Commonly known as: CLEOCIN Take 1 capsule (300 mg total) by mouth 3 (three) times daily for 7 days.   Contour Next Test test strip Generic drug: glucose blood USE 8 TIMES DAILY AND AS DIRECTED   ezetimibe 10 MG tablet Commonly known as: ZETIA Take 10 mg by mouth daily.   ferrous sulfate 325 (65 FE) MG tablet Take 1 tablet (325 mg total) by mouth 2 (two) times daily with a meal. What changed: when to take this   insulin pump Soln Inject into the skin. Novolog. Base: 25.45 units per day.  1 unit per 10 grams of carbohydrates.   metoprolol succinate 50 MG 24 hr tablet Commonly known as: TOPROL-XL Take 50 mg by mouth daily. Take with or immediately following a meal.   olmesartan 5 MG tablet Commonly known as: BENICAR Take 10 mg by mouth daily.   omeprazole 40 MG capsule Commonly known as: PRILOSEC Take 40 mg by mouth daily.   saccharomyces boulardii 250 MG capsule Commonly known as: FLORASTOR Take 1  capsule (250 mg total) by mouth 2 (two) times daily for 10 days.   senna-docusate 8.6-50 MG tablet Commonly known as: Senokot-S Take 2 tablets by mouth daily as needed for mild constipation.   tadalafil 20 MG tablet Commonly known as: CIALIS Take 20 mg by mouth daily as needed for erectile dysfunction.   Vitamin D (Ergocalciferol) 1.25 MG (50000 UNIT) Caps capsule Commonly known as: DRISDOL Take 50,000 Units by mouth 2 (two) times a week. Monday and Wednesday      Time coordinating discharge: 35 minutes  The results of significant diagnostics from this hospitalization (including imaging, microbiology, ancillary and laboratory) are listed below for reference.    Procedures and Diagnostic Studies:   DG Ankle 2 Views Right  Result Date: 08/27/2019 Please see detailed radiograph report in office note.  MR ANKLE RIGHT W WO CONTRAST  Result Date:  08/27/2019 CLINICAL DATA:  Foot swelling diabetic osteomyelitis EXAM: MRI OF THE RIGHT ANKLE WITHOUT AND WITH CONTRAST TECHNIQUE: Multiplanar, multisequence MR imaging of the ankle was performed before and after the administration of intravenous contrast. CONTRAST:  44mL GADAVIST GADOBUTROL 1 MMOL/ML IV SOLN COMPARISON:  Outside radiograph same day FINDINGS: TENDONS Peroneal: Peroneal longus tendon intact. Peroneal brevis intact. There is however increased intrasubstance signal thickening seen throughout the peroneal tendons. Posteromedial: Posterior tibial tendon intact. Flexor hallucis longus tendon intact. Flexor digitorum longus tendon intact. Fluid and increased intrasubstance signal thickening seen throughout the posterior tibialis, flexor hallucis longus and flexor digitorum tendons. Anterior: Tibialis anterior tendon intact. Extensor hallucis longus tendon intact Extensor digitorum longus tendon intact. Achilles:  Intact. Plantar Fascia: There is increased intrasubstance signal and thickening seen throughout the plantar fascia with probable tiny partial tear at the insertion site. LIGAMENTS Lateral: There is thickening of the anterior talofibular ligament, however does appear to be intact. Calcaneofibular ligament intact. Posterior talofibular ligament intact. The anterior tibiofibular ligament is intact. There is attenuation the only a small portion of the posterior tibiofibular ligament remaining. Medial: There is heterogeneous signal seen throughout the deltoid ligament. There is attenuation of the spring ligament with only a small portion remaining. CARTILAGE Ankle Joint: There is a moderate to large ankle joint effusion which extends into the posterior soft tissues with scattered large amount of debris. There is also multilocular fluid which extends to syndesmosis and lateral aspect of the hindfoot. Subtalar Joints/Sinus Tarsi: There is inferior migration of the talus with fragmentation seen throughout  the midfoot. There is diffuse fragmentation involving the cuboid, navicular, and cuneiform is with the erosive type change. There is increased marrow signal without enhancement seen throughout the talus. Bones: There is increased marrow signal with slight inferior positioning of the calcaneus with diffuse enhancement. There is associated T1 hypointensity and enhancement. A probable tiny nonunited fracture seen at the anterior portion of the calcaneus near the sustentacular tali. There is increased T2 marrow signal seen within the distal tibia with small cortical irregularity, with mild enhancement in the medial malleolus. There is a nonunited distal fibular fracture. Increased T2 hyperintense signal seen throughout the distal tibia with mild enhancement and subtle T1 hypointensity. Soft Tissue: There is a large area of ulceration seen on the plantar surface beneath the calcaneus measuring 4.4 cm in length. No underlying loculated fluid collection or sinus tract. There is diffuse dorsal subcutaneous edema seen. There is diffusely increased enhancing signal seen throughout the muscles with diffuse muscular atrophy. IMPRESSION: IMPRESSION 1. Findings of advanced midfoot Charcot arthropathy with extensive fragmentation of the midfoot and inferior  migration of the talus. 2. There is moderate to large ankle and midfoot effusion with synovitis. 3. Plantar surface ulceration without evidence of sinus tract or abscess. However there is findings suggestive of probable early osteomyelitis involving the calcaneus. 4. Findings which could be suggestive early osteomyelitis versus reactive marrow within the distal tibia and distal fibula. 5. Nonunited distal fibular fracture. 6. Probable nonunited small fracture of the sustentacular tali. 7. Probable partial prior tear of the spring ligament and the posterior tibiofibular ligament. 8. Partial insertional tear of the middle cord of the plantar fascia with plantar fasciitis 9.  Extensive dorsal subcutaneous edema and denervation atrophy Electronically Signed   By: Prudencio Pair M.D.   On: 08/27/2019 22:25   DG Foot Complete Right  Result Date: 08/27/2019 Please see detailed radiograph report in office note.  VAS [place] LOWER EXTREMITY VENOUS (DVT) (ONLY [facility] & [facility] 7a-7p)  Result Date: 08/28/2019  Lower Venous DVTStudy Indications: Edema.  Risk Factors: None identified. Limitations: Poor ultrasound/tissue interface. Comparison Study: No prior studies. Performing Technologist: Oliver Hum RVT  Examination Guidelines: A complete evaluation includes B-mode imaging, spectral Doppler, color Doppler, and power Doppler as needed of all accessible portions of each vessel. Bilateral testing is considered an integral part of a complete examination. Limited examinations for reoccurring indications may be performed as noted. The reflux portion of the exam is performed with the patient in reverse Trendelenburg.  +---------+---------------+---------+-----------+----------+--------------+ RIGHT    CompressibilityPhasicitySpontaneityPropertiesThrombus Aging +---------+---------------+---------+-----------+----------+--------------+ CFV      Full           Yes      Yes                                 +---------+---------------+---------+-----------+----------+--------------+ SFJ      Full                                                        +---------+---------------+---------+-----------+----------+--------------+ FV Prox  Full                                                        +---------+---------------+---------+-----------+----------+--------------+ FV Mid   Full                                                        +---------+---------------+---------+-----------+----------+--------------+ FV DistalFull                                                        +---------+---------------+---------+-----------+----------+--------------+ PFV      Full                                                         +---------+---------------+---------+-----------+----------+--------------+  POP      Full           Yes      Yes                                 +---------+---------------+---------+-----------+----------+--------------+ PTV      Full                                                        +---------+---------------+---------+-----------+----------+--------------+ PERO     Full                                                        +---------+---------------+---------+-----------+----------+--------------+ Multiphasic flow was noted in the popliteal, posterior tibial, peroneal, and anterior tibial arteries.  +----+---------------+---------+-----------+----------+--------------+ LEFTCompressibilityPhasicitySpontaneityPropertiesThrombus Aging +----+---------------+---------+-----------+----------+--------------+ CFV Full           Yes      Yes                                 +----+---------------+---------+-----------+----------+--------------+     Summary: RIGHT: - There is no evidence of deep vein thrombosis in the lower extremity.  - No cystic structure found in the popliteal fossa. - Multiphasic flow was noted in the popliteal, posterior tibial, peroneal, and anterior tibial arteries.  LEFT: - No evidence of common femoral vein obstruction.  *See table(s) above for measurements and observations. Electronically signed by Deitra Mayo MD on 08/28/2019 at 4:51:36 PM.    Final      Labs:   Basic Metabolic Panel: Recent Labs  Lab 08/28/19 0530 08/28/19 0530 08/29/19 9485 08/29/19 4627 08/30/19 0458 08/30/19 0458 08/31/19 0510 09/01/19 0051  NA 136  --  138  --  136  --  135 137  K 4.4   < > 4.0   < > 4.5   < > 4.0 4.1  CL 105  --  106  --  104  --  102 101  CO2 21*  --  25  --  22  --  25 26  GLUCOSE 221*  --  127*  --  381*  --  256* 363*  BUN 27*  --  22*  --  22*  --  18 19  CREATININE 1.60*  --   1.66*  --  1.69*  --  1.56* 1.72*  CALCIUM 8.7*  --  8.4*  --  8.7*  --  8.9 9.0   < > = values in this interval not displayed.   GFR Estimated Creatinine Clearance: 56.1 mL/min (A) (by C-G formula based on SCr of 1.72 mg/dL (H)). Liver Function Tests: Recent Labs  Lab 08/27/19 1731  AST 13*  ALT 12  ALKPHOS 97  BILITOT 0.6  PROT 7.2  ALBUMIN 2.5*   No results for input(s): LIPASE, AMYLASE in the last 168 hours. No results for input(s): AMMONIA in the last 168 hours. Coagulation profile No results for input(s): INR, PROTIME in the last 168 hours.  CBC: Recent Labs  Lab 08/27/19 1731 08/27/19 1731 08/28/19 0530 08/29/19 0350  08/30/19 0458 08/31/19 0510 09/01/19 0051  WBC 13.4*   < > 11.1* 10.0 8.9 8.5 9.0  NEUTROABS 10.4*  --   --  7.2 6.3 5.7 6.1  HGB 8.2*   < > 7.7* 7.7* 8.1* 8.2* 8.7*  HCT 27.3*   < > 25.9* 25.2* 28.1* 27.2* 28.4*  MCV 84.0   < > 83.3 84.8 85.9 83.4 83.0  PLT 475*   < > 433* 458* 472* 475* 525*   < > = values in this interval not displayed.   Cardiac Enzymes: No results for input(s): CKTOTAL, CKMB, CKMBINDEX, TROPONINI in the last 168 hours. BNP: Invalid input(s): POCBNP CBG: Recent Labs  Lab 08/31/19 1708 08/31/19 2058 09/01/19 0738 09/01/19 1203 09/01/19 1410  GLUCAP 313* 294* 306* 317* 340*   D-Dimer No results for input(s): DDIMER in the last 72 hours. Hgb A1c No results for input(s): HGBA1C in the last 72 hours. Lipid Profile No results for input(s): CHOL, HDL, LDLCALC, TRIG, CHOLHDL, LDLDIRECT in the last 72 hours. Thyroid function studies No results for input(s): TSH, T4TOTAL, T3FREE, THYROIDAB in the last 72 hours.  Invalid input(s): FREET3 Anemia work up No results for input(s): VITAMINB12, FOLATE, FERRITIN, TIBC, IRON, RETICCTPCT in the last 72 hours. Microbiology Recent Results (from the past 240 hour(s))  Culture, blood (routine x 2)     Status: None   Collection Time: 08/27/19  7:00 PM   Specimen: BLOOD  Result  Value Ref Range Status   Specimen Description   Final    BLOOD LEFT ANTECUBITAL Performed at [facility] [address] [place], [place] [address]    Special Requests   Final    BOTTLES DRAWN AEROBIC ONLY Blood Culture results may not be optimal due to an inadequate volume of blood received in culture bottles Performed at [facility] [address] [place], [place] [address]    Culture   Final    NO GROWTH 5 DAYS Performed at [facility], [place] [address], [place], [place] [address]    Report Status 09/01/2019 FINAL  Final  Culture, blood (routine x 2)     Status: None   Collection Time: 08/27/19  7:00 PM   Specimen: BLOOD LEFT HAND  Result Value Ref Range Status   Specimen Description   Final    BLOOD LEFT HAND Performed at [facility] [address] [place], [place] [address]    Special Requests   Final    BOTTLES DRAWN AEROBIC AND ANAEROBIC Blood Culture adequate volume Performed at [facility] [address], [place], [place] [address]    Culture   Final    NO GROWTH 5 DAYS Performed at [facility], [place] [address] [place], [place] [address]    Report Status 09/01/2019 FINAL  Final  SARS CORONAVIRUS 2 (TAT 6-24 HRS) Nasopharyngeal Nasopharyngeal Swab     Status: None   Collection Time: 08/27/19  7:28 PM   Specimen: Nasopharyngeal Swab  Result Value Ref Range Status   SARS Coronavirus 2 NEGATIVE NEGATIVE Final    Comment: (NOTE) SARS-CoV-2 target nucleic acids are NOT DETECTED. The SARS-CoV-2 RNA is generally detectable in upper and lower respiratory specimens during the acute phase of infection. Negative results do not preclude SARS-CoV-2 infection, do not rule out co-infections with other pathogens, and should not be used as the sole basis for treatment or other patient management decisions. Negative results must be combined with clinical observations, patient  history,  and epidemiological information. The expected result is Negative. Fact Sheet for Patients: SugarRoll.be Fact Sheet for Healthcare Providers: https://www.woods-mathews.com/ This test is not yet approved or cleared by the [place] FDA and  has been authorized for detection and/or diagnosis of SARS-CoV-2 by FDA under an Emergency Use Authorization (EUA). This EUA will remain  in effect (meaning this test can be used) for the duration of the COVID-19 declaration under Section 56 4(b)(1) of the Act, 21 U.S.C. section 360bbb-3(b)(1), unless the authorization is terminated or revoked sooner. Performed at [facility], [place] [address] [place], [place] [address]   MRSA PCR Screening     Status: None   Collection Time: 08/28/19  4:22 PM   Specimen: Nasal Mucosa; Nasopharyngeal  Result Value Ref Range Status   MRSA by PCR NEGATIVE NEGATIVE Final    Comment:        The GeneXpert MRSA Assay (FDA approved for NASAL specimens only), is one component of a comprehensive MRSA colonization surveillance program. It is not intended to diagnose MRSA infection nor to guide or monitor treatment for MRSA infections. Performed at [facility], [place] [address], [place], [place] [address]   Aerobic/Anaerobic Culture (surgical/deep wound)     Status: None (Preliminary result)   Collection Time: 08/28/19  6:42 PM   Specimen: PATH Other; Tissue  Result Value Ref Range Status   Specimen Description   Final    HEEL RIGHT SWAB Performed at [facility] [address], [place], [place] [address]    Special Requests   Final    NONE Performed at [facility], [place] [address], [place], [place] [address]    Gram Stain NO WBC SEEN NO ORGANISMS SEEN   Final   Culture   Final    NO GROWTH 4 DAYS NO ANAEROBES ISOLATED; CULTURE IN PROGRESS FOR 5 DAYS Performed at [facility] [address]  [place], [place] [address]    Report Status PENDING  Incomplete  Aerobic/Anaerobic Culture (surgical/deep wound)     Status: None (Preliminary result)   Collection Time: 08/28/19  6:48 PM   Specimen: PATH Bone resection; Tissue  Result Value Ref Range Status   Specimen Description   Final    BONE HEEL RIGHT Performed at [facility] [address], [place],  [address]    Special Requests   Final    NONE Performed at [facility], [place] [address] [place], [place] [address]    Gram Stain NO WBC SEEN NO ORGANISMS SEEN   Final   Culture   Final    NO GROWTH 4 DAYS NO ANAEROBES ISOLATED; CULTURE IN PROGRESS FOR 5 DAYS Performed at [facility] [address], [place],  [address]    Report Status PENDING  Incomplete    Please note: You were cared for by a hospitalist during your hospital stay. Once you are discharged, your primary care physician will handle any further medical issues. Please note that NO REFILLS for any discharge medications will be authorized once you are discharged, as it is imperative that you return to your primary care physician (or establish a relationship with a primary care physician if you do not have one) for your post hospital discharge needs so that they can reassess your need for medications and monitor your lab values.  Signed: Terrilee Croak  [facility] 09/01/2019, 2:39 PM

## 2019-09-01 NOTE — Progress Notes (Signed)
Home health equipment arrived. Discharge instructions reviewed. Questions, concerns were denied at this time. Dressing changed to right heel per current inpatient order. Dressing clean dry and intact. Pt reports feeling well, A&Ox 4. Ambulatory with use of walker/crutches. Per [facility] agency to follow up with patient regarding continued wound care. Additional wound supplies provided.

## 2019-09-01 NOTE — Progress Notes (Signed)
Physical Therapy Treatment Patient Details Name: Javier Hunt MRN: [id number] DOB: 1976/05/30 Today's Date: 09/01/2019    History of Present Illness 44 yo male s/p I&D R heel 3/26 2* diabetic ulcer infection. Hx of DM, Charcot foot, CKD, anemia    PT Comments    Practiced use of crutches vs RW. Pt performs better/safer and feels more comfortable with using a RW for ambulation. Also practiced using RW to go up/down 1 step backwards to simulate entering home. Pt mentioned possibly using a knee scooter. Encouraged him to speak with MD about this and they could possibly order one for him. Therapy can also come by again to allow pt to practice using one while in the hospital. Will continue to follow.    Follow Up Recommendations  No PT follow up;Supervision for mobility/OOB     Equipment Recommendations  Rolling walker with 5" wheels    Recommendations for Other Services       Precautions / Restrictions Precautions Precautions: Fall Restrictions Weight Bearing Restrictions: Yes RLE Weight Bearing: Non weight bearing Other Position/Activity Restrictions: CAM boot in room    Mobility  Bed Mobility Overal bed mobility: Modified Independent                Transfers Overall transfer level: Needs assistance Equipment used: Crutches;Rolling walker (2 wheeled) Transfers: Sit to/from Stand Sit to Stand: Min guard;Supervision         General transfer comment: Min guard with crutches. Supv with RW. Cues for safety, hand placement, technique  Ambulation/Gait Ambulation/Gait assistance: Min guard;Supervision Gait Distance (Feet): 25 Feet(10) Assistive device: Rolling walker (2 wheeled);Crutches Gait Pattern/deviations: Step-to pattern     General Gait Details: Walked x 1 with crutches-Min guard assist. Unsteady. Then walked x 1 with RW-Supv. Pt performs better and feels more comfortable with using RW for ambulation   Stairs Stairs: Yes Stairs assistance:  Supervision Stair Management: Step to pattern;Backwards;With walker Number of Stairs: 1 General stair comments: Practiced going up/down 1 step with RW backwards. VCs safety, technique.   Wheelchair Mobility    Modified Rankin (Stroke Patients Only)       Balance                                            Cognition Arousal/Alertness: Awake/alert Behavior During Therapy: WFL for tasks assessed/performed Overall Cognitive Status: Within Functional Limits for tasks assessed                                        Exercises      General Comments        Pertinent Vitals/Pain Pain Assessment: No/denies pain    Home Living                      Prior Function            PT Goals (current goals can now be found in the care plan section) Progress towards PT goals: Progressing toward goals    Frequency    Min 3X/week      PT Plan Current plan remains appropriate    Co-evaluation              AM-PAC PT "6 Clicks" Mobility   Outcome Measure  Help needed turning from your back  to your side while in a flat bed without using bedrails?: None Help needed moving from lying on your back to sitting on the side of a flat bed without using bedrails?: None Help needed moving to and from a bed to a chair (including a wheelchair)?: A Little Help needed standing up from a chair using your arms (e.g., wheelchair or bedside chair)?: A Little Help needed to walk in hospital room?: A Little Help needed climbing 3-5 steps with a railing? : A Little 6 Click Score: 20    End of Session Equipment Utilized During Treatment: Gait belt Activity Tolerance: Patient tolerated treatment well Patient left: in bed;with call bell/phone within reach   PT Visit Diagnosis: Difficulty in walking, not elsewhere classified (R26.2)     Time: 4196-2229 PT Time Calculation (min) (ACUTE ONLY): 22 min  Charges:  $Gait Training: 8-22 mins                          Doreatha Massed, PT Acute Rehabilitation

## 2019-09-01 NOTE — Discharge Instructions (Signed)
Wound care instructions per podiatry: [facility] to right heel 3x a week. Black foam to wound base, followed by adherent dressing. Bridge to lateral or medial side of foot Dress with ACE bandage   Call Podiatry

## 2019-09-01 NOTE — Discharge Instr - Supplementary Instructions (Signed)
Call podiatry office at [phone] to set up wound care appointment outpatient for Thursday.

## 2019-09-01 NOTE — TOC Benefit Eligibility Note (Signed)
Transition of Care [facility]) Benefit Eligibility Note    Patient Details  Name: Javier Hunt MRN: [id number] Date of Birth: Feb 06, 1976               Spoke with Person/Company/Phone Number:: [place]/ [employer] [phone]           Additional Notes: network provider's for [facility] are [facility], [facility], [facility] and Mardene Celeste is sending fax with wider search    Kerin Salen Phone Number: 09/01/2019, 5:14 PM

## 2019-09-01 NOTE — TOC Transition Note (Addendum)
Transition of Care [facility]) - CM/SW Discharge Note   Patient Details  Name: Javier Hunt MRN: [id number] Date of Birth: 02/16/1976  Transition of Care [facility]) CM/SW Contact:  Ross Ludwig, LCSW Phone Number: 09/01/2019, 6:29 PM   Clinical Narrative:     CSW contacted [facility], [facility], [facility], [facility], [facility], and [facility] to try to set up home health RN.  All companies unable to accept patient.  CSW discussed with patient the cost of the wound vac per [employer], patient expressed he will take care of the cost.  CSW spoke to [place] at [place], he spoke to patient to confirm payment for wound Vac.  CSW notified attending physician and podiatrist Dr. March Rummage to inform them of the situation with not being able to find a home health agency that can accept patient.  CSW was informed by Dr. March Rummage to have patient call the office in the morning at [phone] to set up an appointment to see patient on Thursday.  CSW updated charge nurse and informed her of what physician said.  CSW spoke to [facility], and they will deliver patient a walker and wound vac today before patient leaves hospital.  [place] signing off, please reconsult if other social work needs arise.   Final next level of care: Home/Self Care Barriers to Discharge: Barriers Resolved   Patient Goals and CMS Choice Patient states their goals for this hospitalization and ongoing recovery are:: To return home with wound vac. CMS Medicare.gov Compare Post Acute Care list provided to:: Patient Choice offered to / list presented to : Patient  Discharge Placement  Patient discharging back home.                     Discharge Plan and Services                DME Arranged: Negative pressure wound device, Walker rolling DME Agency: [facility] Date DME Agency Contacted: 09/01/19 Time DME Agency Contacted: 1400 Representative spoke with at DME Agency: Thedore Mins            Social Determinants  of Health ([place]) Interventions     Readmission Risk Interventions No flowsheet data found.

## 2019-09-01 NOTE — Progress Notes (Signed)
Inpatient Diabetes Program Recommendations  AACE/ADA: New Consensus Statement on Inpatient Glycemic Control (2015)  Target Ranges:  Prepandial:   less than 140 mg/dL      Peak postprandial:   less than 180 mg/dL (1-2 hours)      Critically ill patients:  140 - 180 mg/dL   Lab Results  Component Value Date   GLUCAP 306 (H) 09/01/2019   HGBA1C 6.9 (H) 08/12/2019    Review of Glycemic Control  Diabetes history: Dm type 1 since age of 5 Outpatient Diabetes medications: insulin pump Current orders for Inpatient glycemic control:  Lantus 35 units Novolog 0-15 units tid + hs  Inpatient Diabetes Program Recommendations:    Pt has Type 1 DM and will need carbohydrate coverage. Glucose trends increase after meals due to lack of carb coverage.  Consider Novolog 4 units tid meal coverage if eating >50% of meals.  Thanks,  Tama Headings RN, MSN, BC-ADM Inpatient Diabetes Coordinator Team Pager [phone] (8a-5p)

## 2019-09-01 NOTE — Progress Notes (Signed)
Call placed to SW regarding home health equipment. SW stated that equipment (walker & wound vac) will be delivered this evening prior to patient discharge from [place].

## 2019-09-01 NOTE — Progress Notes (Signed)
Brief note: Vancomycin levels  See 3/28 note by Karel Jarvis for details  Asessement: Pk = 27 mcg/ml , Trough = 9 mcg/ml with est AUC = 417 on Vancomycin 1 Gm IV q24h Scr 1.60>1.66>1.69>1.56>1.72 somewhat stable  Plan: Increase Vancomycin to 1250 mg IV q24h for est AUC = 521 and Cmax = 37 and Cmin = 11.2  Thanks Dorrene German 09/01/2019 2:40 AM

## 2019-09-01 NOTE — Progress Notes (Signed)
Call placed to podiatrist per hospitalist recommendation for home wound care routine.  Voicemail message left. Secure chat message sent as well. Awaiting return call.

## 2019-09-01 NOTE — Progress Notes (Signed)
Patient has been discharged. Awaiting arrival of home health equipment, walker and wound vac.

## 2019-09-02 ENCOUNTER — Telehealth: Payer: Self-pay | Admitting: Internal Medicine

## 2019-09-02 ENCOUNTER — Telehealth: Payer: Self-pay | Admitting: *Deleted

## 2019-09-02 LAB — AEROBIC/ANAEROBIC CULTURE W GRAM STAIN (SURGICAL/DEEP WOUND)
Culture: NO GROWTH
Culture: NO GROWTH
Gram Stain: NONE SEEN
Gram Stain: NONE SEEN

## 2019-09-02 NOTE — Telephone Encounter (Signed)
[facility] [place], agent states does not know where the Approval came from, but he needs the CPT code for the wound vac and the ICD.10 codes for the diagnosis. I told Gerald Stabs, I had just looked up the CPT code for the wound vac on [employer] as [phone] and the ICD.10 codes were M14.671, E11.621, and L97.413.

## 2019-09-02 NOTE — Telephone Encounter (Signed)
I spoke with [employer] [facility] [place] asked for CPT code for the Wound vac, then came back on the stating the wound vac had been pre-certed approved, and asked Dr. Eleanora Neighbor NPI, and if I knew what company the item was being billed through. I told Gerald Stabs it appears the Wound Vac was ordered through the [place]. Gerald Stabs states the Approval code:  [phone] and will fax the approval.

## 2019-09-02 NOTE — TOC Progression Note (Signed)
CSW spoke to patient via phone, and informed him that CSW was still looking for a home health agency.  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  CSW contacted Thedore Mins to have him call patient.  CSW was informed that [place] can accept patient and can see him on Monday.  Per patient he has an appointment at [facility] for 10:15am tomorrow.  CSW updated [facility] about home health agency following patient.  Jones Broom. Emmarose Klinke, MSW, LCSW [phone]  09/02/2019 2:58 PM

## 2019-09-02 NOTE — Telephone Encounter (Signed)
[facility] [place] states they need information to pre-cert pt's wound vac.

## 2019-09-02 NOTE — Telephone Encounter (Signed)
12:30pm  CSW spoke to patient via phone, and informed him that CSW was still looking for a home health agency.  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  CSW contacted Thedore Mins to have him call patient.  CSW was informed that [place] can accept patient and can see him on Monday.  Per patient he has an appointment at [facility] for 10:15am Thursday April 1st.  CSW updated [employer] about home health agency following patient.  3:15pm  CSW spoke to Candi Leash from [facility], she confirmed that agency is able to accept patient and his insurance.  CSW discussed with patient the name of the agency that will be seeing him via phone.  Per [facility] they can see patient on Monday.  Jones Broom. Sanav Remer, MSW, LCSW [phone]  09/02/2019 2:58 PM

## 2019-09-03 ENCOUNTER — Ambulatory Visit: Payer: BC Managed Care – PPO | Admitting: Podiatry

## 2019-09-03 ENCOUNTER — Other Ambulatory Visit: Payer: Self-pay

## 2019-09-03 ENCOUNTER — Encounter: Payer: Self-pay | Admitting: Podiatry

## 2019-09-03 VITALS — Temp 96.8°F

## 2019-09-03 DIAGNOSIS — E11621 Type 2 diabetes mellitus with foot ulcer: Secondary | ICD-10-CM

## 2019-09-03 DIAGNOSIS — L97413 Non-pressure chronic ulcer of right heel and midfoot with necrosis of muscle: Secondary | ICD-10-CM

## 2019-09-03 NOTE — Telephone Encounter (Addendum)
[place] presents to office for [facility] orders for pt.Dr. March Rummage Secure Chat message states he would like Monday and Wednesday right foot wound cleansed, then wound vac black foam to the wound base followed by transparent dressing, bridge to the lateral side of the foot. Wound vac will be applied in office on Fridays. Faxed required form, clinicals and demographics to [facility].

## 2019-09-03 NOTE — Telephone Encounter (Signed)
Thank you :)

## 2019-09-07 ENCOUNTER — Telehealth: Payer: Self-pay | Admitting: *Deleted

## 2019-09-07 NOTE — Telephone Encounter (Signed)
Pt asked if he could get his COVID shot after being on the antibiotic.

## 2019-09-08 NOTE — Telephone Encounter (Signed)
Left message with Dr. Eleanora Neighbor 09/08/2019 12:54pm recommendation.

## 2019-09-08 NOTE — Telephone Encounter (Signed)
It should be fine to get his Covid shot with the Antibiotics. I didn't see any guidelines to the contrary

## 2019-09-09 ENCOUNTER — Other Ambulatory Visit: Payer: Self-pay | Admitting: Family

## 2019-09-09 DIAGNOSIS — D539 Nutritional anemia, unspecified: Secondary | ICD-10-CM

## 2019-09-10 ENCOUNTER — Encounter: Payer: Self-pay | Admitting: Family

## 2019-09-10 ENCOUNTER — Other Ambulatory Visit: Payer: Self-pay

## 2019-09-10 ENCOUNTER — Inpatient Hospital Stay: Payer: BC Managed Care – PPO | Attending: Hematology & Oncology | Admitting: Family

## 2019-09-10 ENCOUNTER — Inpatient Hospital Stay: Payer: BC Managed Care – PPO

## 2019-09-10 VITALS — BP 149/89 | HR 70 | Temp 97.1°F | Resp 18 | Ht 66.0 in | Wt 194.0 lb

## 2019-09-10 DIAGNOSIS — G629 Polyneuropathy, unspecified: Secondary | ICD-10-CM | POA: Diagnosis not present

## 2019-09-10 DIAGNOSIS — E1022 Type 1 diabetes mellitus with diabetic chronic kidney disease: Secondary | ICD-10-CM | POA: Insufficient documentation

## 2019-09-10 DIAGNOSIS — Z807 Family history of other malignant neoplasms of lymphoid, hematopoietic and related tissues: Secondary | ICD-10-CM | POA: Diagnosis not present

## 2019-09-10 DIAGNOSIS — Z9641 Presence of insulin pump (external) (internal): Secondary | ICD-10-CM | POA: Diagnosis not present

## 2019-09-10 DIAGNOSIS — Z809 Family history of malignant neoplasm, unspecified: Secondary | ICD-10-CM | POA: Insufficient documentation

## 2019-09-10 DIAGNOSIS — N189 Chronic kidney disease, unspecified: Secondary | ICD-10-CM | POA: Insufficient documentation

## 2019-09-10 DIAGNOSIS — D509 Iron deficiency anemia, unspecified: Secondary | ICD-10-CM | POA: Diagnosis not present

## 2019-09-10 DIAGNOSIS — Z86010 Personal history of colonic polyps: Secondary | ICD-10-CM | POA: Diagnosis not present

## 2019-09-10 DIAGNOSIS — M14671 Charcot's joint, right ankle and foot: Secondary | ICD-10-CM | POA: Insufficient documentation

## 2019-09-10 DIAGNOSIS — N1831 Chronic kidney disease, stage 3a: Secondary | ICD-10-CM

## 2019-09-10 DIAGNOSIS — D631 Anemia in chronic kidney disease: Secondary | ICD-10-CM | POA: Insufficient documentation

## 2019-09-10 DIAGNOSIS — D539 Nutritional anemia, unspecified: Secondary | ICD-10-CM

## 2019-09-10 DIAGNOSIS — Z794 Long term (current) use of insulin: Secondary | ICD-10-CM | POA: Insufficient documentation

## 2019-09-10 DIAGNOSIS — D649 Anemia, unspecified: Secondary | ICD-10-CM

## 2019-09-10 DIAGNOSIS — D500 Iron deficiency anemia secondary to blood loss (chronic): Secondary | ICD-10-CM

## 2019-09-10 HISTORY — DX: Anemia in chronic kidney disease: D63.1

## 2019-09-10 HISTORY — DX: Iron deficiency anemia secondary to blood loss (chronic): D50.0

## 2019-09-10 HISTORY — DX: Chronic kidney disease, stage 3a: N18.31

## 2019-09-10 LAB — CBC WITH DIFFERENTIAL (CANCER CENTER ONLY)
Abs Immature Granulocytes: 0.08 10*3/uL — ABNORMAL HIGH (ref 0.00–0.07)
Basophils Absolute: 0.1 10*3/uL (ref 0.0–0.1)
Basophils Relative: 1 %
Eosinophils Absolute: 0.5 10*3/uL (ref 0.0–0.5)
Eosinophils Relative: 5 %
HCT: 32.3 % — ABNORMAL LOW (ref 39.0–52.0)
Hemoglobin: 9.7 g/dL — ABNORMAL LOW (ref 13.0–17.0)
Immature Granulocytes: 1 %
Lymphocytes Relative: 17 %
Lymphs Abs: 1.7 10*3/uL (ref 0.7–4.0)
MCH: 25.1 pg — ABNORMAL LOW (ref 26.0–34.0)
MCHC: 30 g/dL (ref 30.0–36.0)
MCV: 83.7 fL (ref 80.0–100.0)
Monocytes Absolute: 1 10*3/uL (ref 0.1–1.0)
Monocytes Relative: 10 %
Neutro Abs: 6.7 10*3/uL (ref 1.7–7.7)
Neutrophils Relative %: 66 %
Platelet Count: 420 10*3/uL — ABNORMAL HIGH (ref 150–400)
RBC: 3.86 MIL/uL — ABNORMAL LOW (ref 4.22–5.81)
RDW: 15.7 % — ABNORMAL HIGH (ref 11.5–15.5)
WBC Count: 10 10*3/uL (ref 4.0–10.5)
nRBC: 0 % (ref 0.0–0.2)

## 2019-09-10 LAB — RETICULOCYTES
Immature Retic Fract: 11 % (ref 2.3–15.9)
RBC.: 3.87 MIL/uL — ABNORMAL LOW (ref 4.22–5.81)
Retic Count, Absolute: 122.3 10*3/uL (ref 19.0–186.0)
Retic Ct Pct: 3.2 % — ABNORMAL HIGH (ref 0.4–3.1)

## 2019-09-10 LAB — CMP (CANCER CENTER ONLY)
ALT: 35 U/L (ref 0–44)
AST: 39 U/L (ref 15–41)
Albumin: 3.2 g/dL — ABNORMAL LOW (ref 3.5–5.0)
Alkaline Phosphatase: 124 U/L (ref 38–126)
Anion gap: 6 (ref 5–15)
BUN: 23 mg/dL — ABNORMAL HIGH (ref 6–20)
CO2: 28 mmol/L (ref 22–32)
Calcium: 9.3 mg/dL (ref 8.9–10.3)
Chloride: 103 mmol/L (ref 98–111)
Creatinine: 1.73 mg/dL — ABNORMAL HIGH (ref 0.61–1.24)
GFR, Est AFR Am: 54 mL/min — ABNORMAL LOW (ref >60)
GFR, Estimated: 47 mL/min — ABNORMAL LOW (ref >60)
Glucose, Bld: 126 mg/dL — ABNORMAL HIGH (ref 70–99)
Potassium: 4.5 mmol/L (ref 3.5–5.1)
Sodium: 137 mmol/L (ref 135–145)
Total Bilirubin: 0.5 mg/dL (ref 0.3–1.2)
Total Protein: 7 g/dL (ref 6.5–8.1)

## 2019-09-10 LAB — LACTATE DEHYDROGENASE: LDH: 228 U/L — ABNORMAL HIGH (ref 98–192)

## 2019-09-10 LAB — SAVE SMEAR(SSMR), FOR PROVIDER SLIDE REVIEW

## 2019-09-10 LAB — SAMPLE TO BLOOD BANK

## 2019-09-10 NOTE — Progress Notes (Signed)
Hematology/Oncology Consultation   Name: Javier Hunt      MRN: [id number]    Location: Room/bed info not found  Date: 09/10/2019 Time:11:29 AM   REFERRING PHYSICIAN: [name], MD  REASON FOR CONSULT: Anemia    DIAGNOSIS: Iron deficiency anemia  Anemia secondary to chronic renal insufficiency.   HISTORY OF PRESENT ILLNESS: [name] is a very pleasant 44 yo African American gentleman with recent anemia. He did require a blood transfusion, 2 units, in March for Hgb 7.3.  Iron saturation in early March was 10% and ferritin 411. He started taking an oral iron supplement BID recently and Hgb is improved at 9.7, MCV 83.  He has not noted any episodes of blood loss. No bruising or petechiae.  He is a type I diabetic and has an insulin pump. He states that his blood sugars are fairly well controlled and most recent Hgb A1c (March 2021) was 6.9.  BUN is 28 and Creatinine 1.73. He has severe neuropathy in his feet and mild in his fingertips.  He has Charcot ankle on the right that became infected and had to be cleaned out. He currently has a wound vac on and a wound care nurse comes out regularly to clean and re bandage his foot. He finished his antibiotic earlier this week on Tuesday.  No history of sickle cell disease or trait.  He states that his mother also had history of anemia.  No personal history of cancer. His father had Non-Hodgkin's lymphoma and his brother passed away at 28 from an unknown primary.  He states that his last colonoscopy was in February 2020 and he had 3 benign polyps removed.  No fever, chills, n/v, cough, rash, dizziness, SOB, chest pain, palpitations, abdominal pain or changes in bowel or bladder habits.  He has maintained a good appetite and is staying well hydrated. His weight is described as stable.  He does not smoke, use recreational drugs or drink alcoholic beverages.  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   ROS: All other 10 point review of systems is negative.   PAST MEDICAL HISTORY:   Past Medical History:  Diagnosis Date  . Diabetes ([place])   . Kidney disease     ALLERGIES: Allergies  Allergen Reactions  . Lisinopril Cough  . Keflex [Cephalexin] Rash      MEDICATIONS:  Current Outpatient Medications on File Prior to Visit  Medication Sig Dispense Refill  . aspirin 81 MG tablet Take 81 mg by mouth daily.    Marland Kitchen atorvastatin (LIPITOR) 40 MG tablet Take 40 mg by mouth daily at 6 PM.     . CONTOUR NEXT TEST test strip USE 8 TIMES DAILY AND AS DIRECTED  12  . ezetimibe (ZETIA) 10 MG tablet Take 10 mg by mouth daily.    . ferrous sulfate 325 (65 FE) MG tablet Take 1 tablet (325 mg total) by mouth 2 (two) times daily with a meal. (Patient taking differently: Take 325 mg by mouth daily with breakfast. )  3  . Insulin Human (INSULIN PUMP) SOLN Inject into the skin. Novolog. Base: 25.45 units per day.  1 unit per 10 grams of carbohydrates.    . metoprolol succinate (TOPROL-XL) 50 MG 24 hr tablet Take 50 mg by mouth daily. Take with or immediately following a meal.    . olmesartan (BENICAR) 5 MG tablet Take 10 mg by  mouth daily.    Marland Kitchen omeprazole (PRILOSEC) 40 MG capsule Take 40 mg by mouth daily.  3  . saccharomyces boulardii (FLORASTOR) 250 MG capsule Take 1 capsule (250 mg total) by mouth 2 (two) times daily for 10 days. 20 capsule 0  . senna-docusate (SENOKOT-S) 8.6-50 MG tablet Take 2 tablets by mouth daily as needed for mild constipation. 30 tablet 1  . tadalafil (CIALIS) 20 MG tablet Take 20 mg by mouth daily as needed for erectile dysfunction.   8  . Vitamin D, Ergocalciferol, (DRISDOL) 50000 UNITS CAPS capsule Take 50,000 Units by mouth 2 (two) times a week. Monday and Wednesday     No current facility-administered medications on file prior to visit.     PAST SURGICAL HISTORY Past Surgical History:  Procedure Laterality Date  . APPENDECTOMY     . IRRIGATION AND DEBRIDEMENT FOOT Right 08/28/2019   Procedure: RIght foot wound incision and drainage, debridement and irrigation, bone biopsy, application of wound VAC;  Surgeon: Evelina Bucy, DPM;  Location: [facility];  Service: Podiatry;  Laterality: Right;    FAMILY HISTORY: Family History  Problem Relation Age of Onset  . Heart failure Mother   . Stroke Mother   . Cancer Mother   . Cancer Father   . Heart failure Father     SOCIAL HISTORY:  reports that he has never smoked. He has never used smokeless tobacco. He reports that he does not drink alcohol or use drugs.  PERFORMANCE STATUS: The patient's performance status is 1 - Symptomatic but completely ambulatory  PHYSICAL EXAM: Most Recent Vital Signs: Blood pressure (!) 149/89, pulse 70, temperature (!) 97.1 F (36.2 C), temperature source Temporal, resp. rate 18, height 5\' 6"  (1.676 m), weight 194 lb (88 kg), SpO2 100 %. BP (!) 149/89 (BP Location: Left Arm, Patient Position: Sitting)   Pulse 70   Temp (!) 97.1 F (36.2 C) (Temporal)   Resp 18   Ht 5\' 6"  (1.676 m)   Wt 194 lb (88 kg)   SpO2 100%   BMI 31.31 kg/m   General Appearance:    Alert, cooperative, no distress, appears stated age  Head:    Normocephalic, without obvious abnormality, atraumatic  Eyes:    PERRL, conjunctiva/corneas clear, EOM's intact, fundi    benign, both eyes             Throat:   Lips, mucosa, and tongue normal; teeth and gums normal  Neck:   Supple, symmetrical, trachea midline, no adenopathy;       thyroid:  No enlargement/tenderness/nodules; no carotid   bruit or JVD  Back:     Symmetric, no curvature, ROM normal, no CVA tenderness  Lungs:     Clear to auscultation bilaterally, respirations unlabored  Chest wall:    No tenderness or deformity  Heart:    Regular rate and rhythm, S1 and S2 normal, no murmur, rub   or gallop  Abdomen:     Soft, non-tender, bowel sounds active all four quadrants,    no masses, no organomegaly         Extremities:   Extremities normal, atraumatic, no cyanosis or edema  Pulses:   2+ and symmetric all extremities  Skin:   Skin color, texture, turgor normal, no rashes or lesions  Lymph nodes:   Cervical, supraclavicular, and axillary nodes normal  Neurologic:   CNII-XII intact. Normal strength, sensation and reflexes      throughout    LABORATORY DATA:  Results  for orders placed or performed in visit on 09/10/19 (from the past 48 hour(s))  Save Smear (SSMR)     Status: None   Collection Time: 09/10/19 10:53 AM  Result Value Ref Range   Smear Review SMEAR STAINED AND AVAILABLE FOR REVIEW     Comment: Performed at [facility] at [facility], [address] [place], [place] [address]  CBC with Differential (Cancer Center Only)     Status: Abnormal   Collection Time: 09/10/19 10:53 AM  Result Value Ref Range   WBC Count 10.0 4.0 - 10.5 K/uL   RBC 3.86 (L) 4.22 - 5.81 MIL/uL   Hemoglobin 9.7 (L) 13.0 - 17.0 g/dL   HCT 32.3 (L) 39.0 - 52.0 %   MCV 83.7 80.0 - 100.0 fL   MCH 25.1 (L) 26.0 - 34.0 pg   MCHC 30.0 30.0 - 36.0 g/dL   RDW 15.7 (H) 11.5 - 15.5 %   Platelet Count 420 (H) 150 - 400 K/uL   nRBC 0.0 0.0 - 0.2 %   Neutrophils Relative % 66 %   Neutro Abs 6.7 1.7 - 7.7 K/uL   Lymphocytes Relative 17 %   Lymphs Abs 1.7 0.7 - 4.0 K/uL   Monocytes Relative 10 %   Monocytes Absolute 1.0 0.1 - 1.0 K/uL   Eosinophils Relative 5 %   Eosinophils Absolute 0.5 0.0 - 0.5 K/uL   Basophils Relative 1 %   Basophils Absolute 0.1 0.0 - 0.1 K/uL   Immature Granulocytes 1 %   Abs Immature Granulocytes 0.08 (H) 0.00 - 0.07 K/uL    Comment: Performed at [facility] Lab at [facility], [address], [place], [place] [address]  Reticulocytes     Status: Abnormal   Collection Time: 09/10/19 10:54 AM  Result Value Ref Range   Retic Ct Pct 3.2 (H) 0.4 - 3.1 %   RBC. 3.87 (L) 4.22 - 5.81 MIL/uL   Retic Count, Absolute 122.3 19.0 - 186.0 K/uL    Immature Retic Fract 11.0 2.3 - 15.9 %    Comment: Performed at [facility] Lab at [facility], [address] [place], [place] [address]      RADIOGRAPHY: No results found.     PATHOLOGY: None   ASSESSMENT/PLAN: Mr. President is a very pleasant 44 yo African American gentleman with recent diagnosis of anemia secondary to iron deficiency as well as renal insufficiency with type 1 diabetes.   He will continue his iron supplement BID.  We will see what his erythropoietin level looks like. He may benefit from an ESA.  We will scheduled follow-up and treatment if needed once we have his results in hand.   All questions were answered and he is in agreement with the plan. He will contact our office with any questions or concerns. We can certainly see him sooner if needed  He was discussed with and also seen by Dr. Marin Olp and he is in agreement with the aforementioned.   Laverna Peace, NP  Addendum: I saw and examined Mr. Platts with Judson Roch.  I agree with the above assessment.  He is on oral iron right now.  Looks like his hemoglobin is coming up.  I did look at his blood smear under the microscope.  He had some slight microcytic red blood cells.  There were no target cells.  I saw no anisocytosis or poikilocytosis.  There was no rouleaux formation.  I saw no nucleated red blood cells.  White cells appeared normal in morphology maturation.  There was a rare hypersegmented poly-.  Platelets were adequate number and size.  Platelets were well granulated.  Again I have to believe this is going to be iron deficiency anemia.  I also suspect that he may have some erythropoietin deficiency because of his longstanding diabetes.  He does have some mild renal insufficiency.  He is very nice.  It was a lot of fun talking to him about his life.  We will plan to see him back in about a month or so.  We will see what his iron studies look like.  We will see what his  erythropoietin level looks like.  I would like to think that we can just watch him before right now.  We will see what is hemoglobin is when he comes back.  If there is still significant anemia, then we might think about ESA.  We spent about 45 minutes with Mr. Rousseau.  Again he was very nice and a lot of fun to talk to.  Lattie Haw, MD

## 2019-09-11 ENCOUNTER — Ambulatory Visit: Payer: BC Managed Care – PPO | Admitting: Podiatry

## 2019-09-11 ENCOUNTER — Telehealth: Payer: Self-pay | Admitting: Family

## 2019-09-11 DIAGNOSIS — L97413 Non-pressure chronic ulcer of right heel and midfoot with necrosis of muscle: Secondary | ICD-10-CM

## 2019-09-11 DIAGNOSIS — E11621 Type 2 diabetes mellitus with foot ulcer: Secondary | ICD-10-CM | POA: Diagnosis not present

## 2019-09-11 LAB — IRON AND TIBC
Iron: 55 ug/dL (ref 42–163)
Saturation Ratios: 24 % (ref 20–55)
TIBC: 233 ug/dL (ref 202–409)
UIBC: 177 ug/dL (ref 117–376)

## 2019-09-11 LAB — ERYTHROPOIETIN: Erythropoietin: 11.3 m[IU]/mL (ref 2.6–18.5)

## 2019-09-11 LAB — FERRITIN: Ferritin: 691 ng/mL — ABNORMAL HIGH (ref 24–336)

## 2019-09-11 NOTE — Telephone Encounter (Signed)
No los 4/8 °

## 2019-09-14 LAB — HGB FRACTIONATION CASCADE
Hgb A2: 2.3 % (ref 1.8–3.2)
Hgb A: 97.7 % (ref 96.4–98.8)
Hgb F: 0 % (ref 0.0–2.0)
Hgb S: 0 %

## 2019-09-16 ENCOUNTER — Telehealth: Payer: Self-pay | Admitting: Family

## 2019-09-16 NOTE — Telephone Encounter (Signed)
Called and spoke with patient regarding appointments scheduled per 4/14 sch msg

## 2019-09-18 ENCOUNTER — Other Ambulatory Visit: Payer: Self-pay

## 2019-09-18 ENCOUNTER — Ambulatory Visit: Payer: BC Managed Care – PPO | Admitting: Podiatry

## 2019-09-18 VITALS — BP 148/88 | HR 76 | Temp 97.5°F

## 2019-09-18 DIAGNOSIS — E11621 Type 2 diabetes mellitus with foot ulcer: Secondary | ICD-10-CM | POA: Diagnosis not present

## 2019-09-18 DIAGNOSIS — L97413 Non-pressure chronic ulcer of right heel and midfoot with necrosis of muscle: Secondary | ICD-10-CM | POA: Diagnosis not present

## 2019-09-25 ENCOUNTER — Ambulatory Visit: Payer: BC Managed Care – PPO | Admitting: Podiatry

## 2019-09-25 ENCOUNTER — Other Ambulatory Visit: Payer: Self-pay

## 2019-09-25 DIAGNOSIS — E11621 Type 2 diabetes mellitus with foot ulcer: Secondary | ICD-10-CM

## 2019-09-25 DIAGNOSIS — L97413 Non-pressure chronic ulcer of right heel and midfoot with necrosis of muscle: Secondary | ICD-10-CM | POA: Diagnosis not present

## 2019-09-25 NOTE — Progress Notes (Signed)
  Subjective:  Patient ID: Javier Hunt, male    DOB: 1976-03-27,  MRN: [id number]  No chief complaint on file.   44 y.o. male presents for wound care. States that the wound is doing fine denies new issues or concerns denies s/s of infection. Objective:  Physical Exam: Wound Location: right heel Wound Measurement: 5x5x0.3 Wound Base: Granular/Healthy Peri-wound: Normal Exudate: Scant/small amount Serosanguinous exudate wound without warmth, erythema, signs of acute infection and appears improved compared to last recheck   Assessment:   1. Diabetic ulcer of right heel associated with type 2 diabetes mellitus, with necrosis of muscle ([place])      Plan:  Patient was evaluated and treated and all questions answered.  Ulcer right foot -Offload ulcer with CAM boot -Wound cleansed and debrided Procedure: Selective Debridement of Wound Rationale: Removal of devitalized tissue from the wound to promote healing.  Pre-Debridement Wound Measurements: 5 cm x 5 cm x 0.3 cm  Post-Debridement Wound Measurements: same as pre-debridement. Type of Debridement: sharp selective Tissue Removed: Devitalized soft-tissue Dressing: Dry, sterile, compression dressing. Disposition: Patient tolerated procedure well. Patient to return in 1 week for follow-up.  Procedure: Wound VAC Application Location: right heel Wound Measurement: 5 cm x 5 cm x 0.3 cm  Technique: Black foam to wound base, followed by adherent dressing. Set to 125 mmHg with good seal noted. Disposition: Patient tolerated procedure well.  Charcot Right Foot -XR at next visit  Return in about 1 week (around 10/02/2019) for Wound Care, Right, with XRs.

## 2019-10-01 ENCOUNTER — Other Ambulatory Visit: Payer: Self-pay

## 2019-10-01 ENCOUNTER — Ambulatory Visit (INDEPENDENT_AMBULATORY_CARE_PROVIDER_SITE_OTHER): Payer: BC Managed Care – PPO

## 2019-10-01 ENCOUNTER — Ambulatory Visit: Payer: BC Managed Care – PPO | Admitting: Podiatry

## 2019-10-01 VITALS — Temp 96.1°F

## 2019-10-01 DIAGNOSIS — L97413 Non-pressure chronic ulcer of right heel and midfoot with necrosis of muscle: Secondary | ICD-10-CM

## 2019-10-01 DIAGNOSIS — E11621 Type 2 diabetes mellitus with foot ulcer: Secondary | ICD-10-CM

## 2019-10-01 NOTE — Patient Instructions (Signed)
Pre-Operative Instructions  Congratulations, you have decided to take an important step towards improving your quality of life.  You can be assured that the doctors and staff at [facility] will be with you every step of the way.  Here are some important things you should know:  1. Plan to be at the surgery center/hospital at least 1 (one) hour prior to your scheduled time, unless otherwise directed by the surgical center/hospital staff.  You must have a responsible adult accompany you, remain during the surgery and drive you home.  Make sure you have directions to the surgical center/hospital to ensure you arrive on time. 2. If you are having surgery at [facility] or [facility], you will need a copy of your medical history and physical form from your family physician within one month prior to the date of surgery. We will give you a form for your primary physician to complete.  3. We make every effort to accommodate the date you request for surgery.  However, there are times where surgery dates or times have to be moved.  We will contact you as soon as possible if a change in schedule is required.   4. No aspirin/ibuprofen for one week before surgery.  If you are on aspirin, any non-steroidal anti-inflammatory medications (Mobic, Aleve, Ibuprofen) should not be taken seven (7) days prior to your surgery.  You make take Tylenol for pain prior to surgery.  5. Medications - If you are taking daily heart and blood pressure medications, seizure, reflux, allergy, asthma, anxiety, pain or diabetes medications, make sure you notify the surgery center/hospital before the day of surgery so they can tell you which medications you should take or avoid the day of surgery. 6. No food or drink after midnight the night before surgery unless directed otherwise by surgical center/hospital staff. 7. No alcoholic beverages 24-hours prior to surgery.  No smoking 24-hours prior or 24-hours after  surgery. 8. Wear loose pants or shorts. They should be loose enough to fit over bandages, boots, and casts. 9. Don't wear slip-on shoes. Sneakers are preferred. 10. Bring your boot with you to the surgery center/hospital.  Also bring crutches or a walker if your physician has prescribed it for you.  If you do not have this equipment, it will be provided for you after surgery. 11. If you have not been contacted by the surgery center/hospital by the day before your surgery, call to confirm the date and time of your surgery. 12. Leave-time from work may vary depending on the type of surgery you have.  Appropriate arrangements should be made prior to surgery with your employer. 13. Prescriptions will be provided immediately following surgery by your doctor.  Fill these as soon as possible after surgery and take the medication as directed. Pain medications will not be refilled on weekends and must be approved by the doctor. 14. Remove nail polish on the operative foot and avoid getting pedicures prior to surgery. 15. Wash the night before surgery.  The night before surgery wash the foot and leg well with water and the antibacterial soap provided. Be sure to pay special attention to beneath the toenails and in between the toes.  Wash for at least three (3) minutes. Rinse thoroughly with water and dry well with a towel.  Perform this wash unless told not to do so by your physician.  Enclosed: 1 Ice pack (please put in freezer the night before surgery)   1 Hibiclens skin cleaner     Pre-op instructions  If you have any questions regarding the instructions, please do not hesitate to call our office.  [place]: [address], , [place] [address] -- [phone]  [place]: [address] [place], [place] [address] -- [phone]  [place]: [address], [place], [place] [address] -- [phone]   Website: [email or web address] 

## 2019-10-08 ENCOUNTER — Telehealth: Payer: Self-pay

## 2019-10-08 NOTE — Telephone Encounter (Addendum)
DOS 10/16/19  DEBRIDEMENT & IRRIGATION RIGHT HEEL - 89791 APPLICATION WOUND VAC RT - 97605  [employer]BS EFFECTIVE DATE - 06/05/2019    In-Network   Max Per Benefit Period Year-to-Date Remaining     CoInsurance         Deductible $750.00 $0.00     Out-Of-Pocket 3 $1,500.00 $50.41  Copay Not Applicable Coinsurance 36% Authorization Required YES  Received auth from [employer] # [id number]

## 2019-10-09 ENCOUNTER — Ambulatory Visit: Payer: BC Managed Care – PPO | Admitting: Podiatry

## 2019-10-09 ENCOUNTER — Other Ambulatory Visit: Payer: Self-pay

## 2019-10-09 ENCOUNTER — Ambulatory Visit (INDEPENDENT_AMBULATORY_CARE_PROVIDER_SITE_OTHER): Payer: BC Managed Care – PPO

## 2019-10-09 VITALS — Temp 97.5°F

## 2019-10-09 DIAGNOSIS — L97413 Non-pressure chronic ulcer of right heel and midfoot with necrosis of muscle: Secondary | ICD-10-CM

## 2019-10-09 DIAGNOSIS — E11621 Type 2 diabetes mellitus with foot ulcer: Secondary | ICD-10-CM | POA: Diagnosis not present

## 2019-10-09 NOTE — Progress Notes (Signed)
  Subjective:  Patient ID: Javier Hunt, male    DOB: 1975-11-22,  MRN: [id number]  Chief Complaint  Patient presents with  . Diabetic Ulcer    R heel. Pt stated, "It seems the same. Same bad odor. Can't really feel pain in the area. Not sure about pus. No fever/chills/N&V/significantly abnormal glucose. Glucose has been lower than usual in the mornings (60-85mg /dL) x1 wk. 68mg /dL today".   44 y.o. male presents for wound care. States that the wound is doing fine denies new issues or concerns denies s/s of infection. Objective:  Physical Exam: Wound Location: right heel Wound Measurement: 3x4 Wound Base: mixed granular/fibrotic Peri-wound: Normal Exudate: Scant/small amount Serosanguinous exudate wound without warmth, erythema, signs of acute infection and appears improved compared to last recheck   Assessment:   1. Diabetic ulcer of right heel associated with type 2 diabetes mellitus, with necrosis of muscle ([place])    Plan:  Patient was evaluated and treated and all questions answered.  Ulcer right foot -Wound improving. Still with odor and some green tinge. No signs of infection. Betadine WTD applied today.  No follow-ups on file.

## 2019-10-09 NOTE — H&P (View-Only) (Signed)
  Subjective:  Patient ID: Javier Hunt, male    DOB: Jun 17, 1975,  MRN: [id number]  Chief Complaint  Patient presents with  . Diabetic Ulcer    R heel. Pt stated, "It seems the same. Same bad odor. Can't really feel pain in the area. Not sure about pus. No fever/chills/N&V/significantly abnormal glucose. Glucose has been lower than usual in the mornings (60-85mg /dL) x1 wk. 68mg /dL today".   44 y.o. male presents for wound care. States that the wound is doing fine denies new issues or concerns denies s/s of infection. Objective:  Physical Exam: Wound Location: right heel Wound Measurement: 3x4 Wound Base: mixed granular/fibrotic Peri-wound: Normal Exudate: Scant/small amount Serosanguinous exudate wound without warmth, erythema, signs of acute infection and appears improved compared to last recheck   Assessment:   1. Diabetic ulcer of right heel associated with type 2 diabetes mellitus, with necrosis of muscle (Javier Hunt)    Plan:  Patient was evaluated and treated and all questions answered.  Ulcer right foot -Wound improving. Still with odor and some green tinge. No signs of infection. Betadine WTD applied today.  No follow-ups on file.

## 2019-10-13 ENCOUNTER — Encounter (HOSPITAL_BASED_OUTPATIENT_CLINIC_OR_DEPARTMENT_OTHER): Payer: Self-pay | Admitting: Podiatry

## 2019-10-13 ENCOUNTER — Other Ambulatory Visit: Payer: Self-pay

## 2019-10-13 ENCOUNTER — Other Ambulatory Visit (HOSPITAL_COMMUNITY)
Admission: RE | Admit: 2019-10-13 | Discharge: 2019-10-13 | Disposition: A | Discharge status: Home / Self Care | Payer: BC Managed Care – PPO | Source: Ambulatory Visit | Attending: Podiatry | Admitting: Podiatry

## 2019-10-13 DIAGNOSIS — Z20822 Contact with and (suspected) exposure to covid-19: Secondary | ICD-10-CM | POA: Insufficient documentation

## 2019-10-13 DIAGNOSIS — Z01812 Encounter for preprocedural laboratory examination: Secondary | ICD-10-CM | POA: Diagnosis present

## 2019-10-13 LAB — SARS CORONAVIRUS 2 (TAT 6-24 HRS): SARS Coronavirus 2: NEGATIVE

## 2019-10-13 NOTE — Progress Notes (Addendum)
Addendum: Spoke with Konrad Felix PA OK TO PROCEED.  H and P and surgical clearance note Javier prevost np dated 10-13-2019  received  And placed on patient chart.  Spoke w/ via phone for pre-op interview---patient Lab needs dos---- ekg            COVID test ----10-13-2019 @1100  am- Arrive at -------1130 am 10-16-2019 No food after midnight, clear liquids with no dailry or mild products from midnight until 730 am then npo Medications to take morning of surgery -----Zetia, Metoprolol Succinate, Omeprazole Diabetic medication ----- Patient Special Instructions -----bring cpap mask, tubing and machine and leave in car, bring insulin pump supplies Pre-Op special Istructions -----calldr price to see about stopping 81 mg aspirin for surgery Patient verbalized understanding of instructions that were given at this phone interview. Patient denies shortness of breath, chest pain, fever, cough a this phone interview.  Anesthesia : ckd stage 3b, insulin pump, osa, 81 mg aspirin Chart to Janett Billow zanetto pa for review  PCP: dr Jeneen Rinks kim saw Javier prevost pa 10-13-2019 for H & P for 10-16-2019 surgery, lov and labs done requested, Lov note dr Jeneen Rinks kin 5-11-201 on chart Labs done 10-13-2019 [facility] on chart cbc with dif, cmet, hemaglobin a 1 ciron bind cap Nephrology lov dr Arty Baumgartner 09-23-2019 on chart Has insulin pump managed by dr Lauraine Rinne [facility] Cardiologist :none Chest x-ray :none EKG :none Echo :none Cardiac Cath : none Sleep Study/ CPAP :uses cpap not sure settings, not sure where sleep study done 4 yrs ago Fasting Blood Sugar :    80-140   / Checks Blood Sugar --4 times a day:   Blood Thinner/ Instructions /Last Dose:n/a ASA / Instructions/ Last Dose : patient instructed to call dr price about stopping 81 mg asoirin for surgery  Patient denies shortness of breath, chest pain, fever, and cough at this phone interview.  Requested H & P note for 10-16-2019 surgery with shelley  durant at dr price office to be faxed to [phone].

## 2019-10-14 ENCOUNTER — Encounter (HOSPITAL_BASED_OUTPATIENT_CLINIC_OR_DEPARTMENT_OTHER): Payer: Self-pay | Admitting: Podiatry

## 2019-10-14 ENCOUNTER — Other Ambulatory Visit: Payer: Self-pay | Admitting: Family

## 2019-10-14 DIAGNOSIS — D500 Iron deficiency anemia secondary to blood loss (chronic): Secondary | ICD-10-CM

## 2019-10-14 DIAGNOSIS — N1831 Chronic kidney disease, stage 3a: Secondary | ICD-10-CM

## 2019-10-14 DIAGNOSIS — D631 Anemia in chronic kidney disease: Secondary | ICD-10-CM

## 2019-10-14 DIAGNOSIS — D649 Anemia, unspecified: Secondary | ICD-10-CM

## 2019-10-15 ENCOUNTER — Inpatient Hospital Stay: Payer: BC Managed Care – PPO | Attending: Hematology & Oncology

## 2019-10-15 ENCOUNTER — Encounter: Payer: Self-pay | Admitting: Family

## 2019-10-15 ENCOUNTER — Other Ambulatory Visit: Payer: Self-pay

## 2019-10-15 ENCOUNTER — Inpatient Hospital Stay (HOSPITAL_BASED_OUTPATIENT_CLINIC_OR_DEPARTMENT_OTHER): Payer: BC Managed Care – PPO | Admitting: Family

## 2019-10-15 VITALS — BP 145/90 | HR 67 | Temp 97.1°F | Resp 18 | Ht 66.0 in | Wt 191.0 lb

## 2019-10-15 DIAGNOSIS — G629 Polyneuropathy, unspecified: Secondary | ICD-10-CM | POA: Diagnosis not present

## 2019-10-15 DIAGNOSIS — D631 Anemia in chronic kidney disease: Secondary | ICD-10-CM | POA: Diagnosis present

## 2019-10-15 DIAGNOSIS — D649 Anemia, unspecified: Secondary | ICD-10-CM | POA: Diagnosis not present

## 2019-10-15 DIAGNOSIS — E611 Iron deficiency: Secondary | ICD-10-CM | POA: Diagnosis not present

## 2019-10-15 DIAGNOSIS — D500 Iron deficiency anemia secondary to blood loss (chronic): Secondary | ICD-10-CM | POA: Diagnosis not present

## 2019-10-15 DIAGNOSIS — N1831 Chronic kidney disease, stage 3a: Secondary | ICD-10-CM

## 2019-10-15 DIAGNOSIS — N189 Chronic kidney disease, unspecified: Secondary | ICD-10-CM | POA: Insufficient documentation

## 2019-10-15 LAB — RETICULOCYTES
Immature Retic Fract: 4.2 % (ref 2.3–15.9)
RBC.: 4.58 MIL/uL (ref 4.22–5.81)
Retic Count, Absolute: 68.2 10*3/uL (ref 19.0–186.0)
Retic Ct Pct: 1.5 % (ref 0.4–3.1)

## 2019-10-15 LAB — CMP (CANCER CENTER ONLY)
ALT: 18 U/L (ref 0–44)
AST: 20 U/L (ref 15–41)
Albumin: 3.4 g/dL — ABNORMAL LOW (ref 3.5–5.0)
Alkaline Phosphatase: 109 U/L (ref 38–126)
Anion gap: 6 (ref 5–15)
BUN: 23 mg/dL — ABNORMAL HIGH (ref 6–20)
CO2: 32 mmol/L (ref 22–32)
Calcium: 9.5 mg/dL (ref 8.9–10.3)
Chloride: 101 mmol/L (ref 98–111)
Creatinine: 1.76 mg/dL — ABNORMAL HIGH (ref 0.61–1.24)
GFR, Est AFR Am: 53 mL/min — ABNORMAL LOW (ref >60)
GFR, Estimated: 46 mL/min — ABNORMAL LOW (ref >60)
Glucose, Bld: 157 mg/dL — ABNORMAL HIGH (ref 70–99)
Potassium: 4.1 mmol/L (ref 3.5–5.1)
Sodium: 139 mmol/L (ref 135–145)
Total Bilirubin: 0.5 mg/dL (ref 0.3–1.2)
Total Protein: 6.8 g/dL (ref 6.5–8.1)

## 2019-10-15 LAB — CBC WITH DIFFERENTIAL (CANCER CENTER ONLY)
Abs Immature Granulocytes: 0.21 10*3/uL — ABNORMAL HIGH (ref 0.00–0.07)
Basophils Absolute: 0 10*3/uL (ref 0.0–0.1)
Basophils Relative: 0 %
Eosinophils Absolute: 0.3 10*3/uL (ref 0.0–0.5)
Eosinophils Relative: 3 %
HCT: 37.7 % — ABNORMAL LOW (ref 39.0–52.0)
Hemoglobin: 11.5 g/dL — ABNORMAL LOW (ref 13.0–17.0)
Immature Granulocytes: 2 %
Lymphocytes Relative: 11 %
Lymphs Abs: 1.3 10*3/uL (ref 0.7–4.0)
MCH: 24.9 pg — ABNORMAL LOW (ref 26.0–34.0)
MCHC: 30.5 g/dL (ref 30.0–36.0)
MCV: 81.8 fL (ref 80.0–100.0)
Monocytes Absolute: 1 10*3/uL (ref 0.1–1.0)
Monocytes Relative: 8 %
Neutro Abs: 8.6 10*3/uL — ABNORMAL HIGH (ref 1.7–7.7)
Neutrophils Relative %: 76 %
Platelet Count: 363 10*3/uL (ref 150–400)
RBC: 4.61 MIL/uL (ref 4.22–5.81)
RDW: 13.4 % (ref 11.5–15.5)
WBC Count: 11.4 10*3/uL — ABNORMAL HIGH (ref 4.0–10.5)
nRBC: 0 % (ref 0.0–0.2)

## 2019-10-15 LAB — SAVE SMEAR(SSMR), FOR PROVIDER SLIDE REVIEW

## 2019-10-15 LAB — IRON AND TIBC
Iron: 43 ug/dL (ref 42–163)
Saturation Ratios: 21 % (ref 20–55)
TIBC: 203 ug/dL (ref 202–409)
UIBC: 159 ug/dL (ref 117–376)

## 2019-10-15 LAB — FERRITIN: Ferritin: 604 ng/mL — ABNORMAL HIGH (ref 24–336)

## 2019-10-15 NOTE — Progress Notes (Signed)
Hematology and Oncology Follow Up Visit  Javier Hunt [id number] [birth year] 44 y.o. 10/15/2019   Principle Diagnosis:  Iron deficiency anemia  Anemia secondary to chronic renal insufficiency  Current Therapy: Observation   Interim History:  Javier Hunt is here today for follow-up. He is doing well and states that his energy is much improved.  He is scheduled to have his ankle wound debrided tomorrow.  He has not noted any episodes of bleeding. No bruising or petechiae.  No fever, chills, n/v, cough, rash, dizziness, SOB, chest pain, palpitations, abdominal pain or changes in bowel or bladder habits.  No swelling or tenderness in his extremities at this time.  The neuropathy in his hands and feet is stable/unchanged.  No falls or syncopal episodes to report. He is using a knee roller walker right now to get around.  His blood sugars are still fairly well controlled.  He has a good appetite and is staying well hydrated. His weight is stable.   ECOG Performance Status: 1 - Symptomatic but completely ambulatory  Medications:  Allergies as of 10/15/2019      Reactions   Lisinopril Cough   Keflex [cephalexin] Rash      Medication List       Accurate as of Oct 15, 2019  8:59 AM. If you have any questions, ask your nurse or doctor.        aspirin 81 MG tablet Take 81 mg by mouth daily.   atorvastatin 40 MG tablet Commonly known as: LIPITOR Take 40 mg by mouth daily at 6 PM.   Contour Next Test test strip Generic drug: glucose blood USE 8 TIMES DAILY AND AS DIRECTED   ezetimibe 10 MG tablet Commonly known as: ZETIA Take 10 mg by mouth daily.   ferrous sulfate 325 (65 FE) MG tablet Take 1 tablet (325 mg total) by mouth 2 (two) times daily with a meal. What changed: when to take this   insulin pump Soln Inject into the skin. Novolog. Base: 25.45 units per day.  1 unit per 10 grams of carbohydrates.  Basal rate varies usually 0.95   metoprolol succinate 50 MG  24 hr tablet Commonly known as: TOPROL-XL Take 50 mg by mouth daily. Take with or immediately following a meal.   olmesartan 5 MG tablet Commonly known as: BENICAR Take 10 mg by mouth daily.   omeprazole 40 MG capsule Commonly known as: PRILOSEC Take 40 mg by mouth daily.   senna-docusate 8.6-50 MG tablet Commonly known as: Senokot-S Take 2 tablets by mouth daily as needed for mild constipation.   tadalafil 20 MG tablet Commonly known as: CIALIS Take 20 mg by mouth daily as needed for erectile dysfunction.   Vitamin D (Ergocalciferol) 1.25 MG (50000 UNIT) Caps capsule Commonly known as: DRISDOL Take 50,000 Units by mouth 2 (two) times a week. Monday and Wednesday       Allergies:  Allergies  Allergen Reactions  . Lisinopril Cough  . Keflex [Cephalexin] Rash    Past Medical History, Surgical history, Social history, and Family History were reviewed and updated.  Review of Systems: All other 10 point review of systems is negative.   Physical Exam:  height is 5\' 6"  (1.676 m) and weight is 191 lb (86.6 kg). His temporal temperature is 97.1 F (36.2 C) (abnormal). His blood pressure is 145/90 (abnormal) and his pulse is 67. His respiration is 18 and oxygen saturation is 100%.   Wt Readings from Last 3 Encounters:  10/15/19  191 lb (86.6 kg)  09/10/19 194 lb (88 kg)  08/27/19 188 lb 0.8 oz (85.3 kg)    Ocular: Sclerae unicteric, pupils equal, round and reactive to light Ear-nose-throat: Oropharynx clear, dentition fair Lymphatic: No cervical or supraclavicular adenopathy Lungs no rales or rhonchi, good excursion bilaterally Heart regular rate and rhythm, no murmur appreciated Abd soft, nontender, positive bowel sounds, no liver or spleen tip palpated on exam, no fluid wave  MSK no focal spinal tenderness, no joint edema Neuro: non-focal, well-oriented, appropriate affect Breasts: Deferred   Lab Results  Component Value Date   WBC 11.4 (H) 10/15/2019   HGB 11.5  (L) 10/15/2019   HCT 37.7 (L) 10/15/2019   MCV 81.8 10/15/2019   PLT 363 10/15/2019   Lab Results  Component Value Date   FERRITIN 691 (H) 09/10/2019   IRON 55 09/10/2019   TIBC 233 09/10/2019   UIBC 177 09/10/2019   IRONPCTSAT 24 09/10/2019   Lab Results  Component Value Date   RETICCTPCT 1.5 10/15/2019   RBC 4.58 10/15/2019   No results found for: KPAFRELGTCHN, LAMBDASER, KAPLAMBRATIO No results found for: IGGSERUM, IGA, IGMSERUM No results found for: Ronnald Ramp, A1GS, A2GS, Violet Baldy, MSPIKE, SPEI   Chemistry      Component Value Date/Time   NA 137 09/10/2019 1053   K 4.5 09/10/2019 1053   CL 103 09/10/2019 1053   CO2 28 09/10/2019 1053   BUN 23 (H) 09/10/2019 1053   CREATININE 1.73 (H) 09/10/2019 1053      Component Value Date/Time   CALCIUM 9.3 09/10/2019 1053   ALKPHOS 124 09/10/2019 1053   AST 39 09/10/2019 1053   ALT 35 09/10/2019 1053   BILITOT 0.5 09/10/2019 1053       Impression and Plan: Javier Hunt is a very pleasant 44 yo African American gentleman with recent diagnosis of anemia secondary to iron deficiency as well as renal insufficiency with type 1 diabetes.   His counts continue to improve. He is still taking an oral iron supplement daily.  We will plan to see him back in another 6 weeks.  He will contact our office with any questions or concerns. We can certainly see him sooner if needed.   Laverna Peace, NP 5/13/20218:59 AM

## 2019-10-15 NOTE — Progress Notes (Signed)
Anesthesia Chart Review   Case: [id number] Date/Time: 10/16/19 1315   Procedure: IRRIGATION AND DEBRIDEMENT OF RIGHT HEEL WITH APPLICATION WOUND VAC (Right )   Anesthesia type: Monitor Anesthesia Care   Pre-op diagnosis: DIABETIC ULCER OF HEEL   Location: [facility] OR ROOM 3 / [place]   Surgeons: Evelina Bucy, DPM      DISCUSSION:44 y.o. never smoker with h/o HTN, sleep apnea w/cpap, DM, CKD Stage III (creatinine stable), iron deficiency anemia, diabetic ulcer right heel scheduled for above procedure 10/16/2019 with Hardie Pulley, DPM.    Recent hospitalization 3/25-3/30/21 with acute osteomyelitis of right calcaneous as well as right Charcot joint.    H&P from PCP on chart.  Pt seen by PCP for preoperative evaluation.  Per note, "Ok to proceed with surgery."  Anticipate pt can proceed with planned procedure barring acute status change.   VS: Ht 5\' 6"  (1.676 m)   Wt 88.5 kg   BMI 31.47 kg/m   PROVIDERS: [name], MD is PCP    LABS: Labs reviewed: Acceptable for surgery. (all labs ordered are listed, but only abnormal results are displayed)  Labs Reviewed - No data to display   IMAGES: Chest Xray 08/12/2019 FINDINGS: The heart size and mediastinal contours are within normal limits. Both lungs are clear. The visualized skeletal structures are unremarkable.  IMPRESSION: No active cardiopulmonary disease.  EKG:   CV: Echo 08/12/2019 IMPRESSIONS    1. Left ventricular ejection fraction, by estimation, is 55 to 60%. The  left ventricle has normal function. The left ventricle has no regional  wall motion abnormalities. Left ventricular diastolic parameters are  consistent with Grade I diastolic  dysfunction (impaired relaxation).  2. Right ventricular systolic function is normal. The right ventricular  size is normal. There is normal pulmonary artery systolic pressure.  3. The mitral valve is abnormal. Trivial mitral valve regurgitation.  4. The  aortic valve is tricuspid. Aortic valve regurgitation is not  visualized. No aortic stenosis is present.  5. The inferior vena cava is normal in size with greater than 50%  respiratory variability, suggesting right atrial pressure of 3 mmHg. Past Medical History:  Diagnosis Date  . Acute osteomyelitis of right calcaneus ([place])    s/p debiidement and wound vac 08-28-2019  . Anemia of chronic renal failure, stage 3a 09/10/2019   sees dr Kathaleen Maser 09-23-2019 on chart  . Charcot's joint of right ankle   . Chronic heel ulcer, right, limited to breakdown of skin ([place])    since march 2021, has wound vac since 10-27-2019 changed vac 10-12-2019  . DM type 1 (diabetes mellitus, type 1) ([place])   . Erythropoietin deficiency anemia 09/10/2019  . Hyperkalemia   . Hypertension   . Iron deficiency anemia due to chronic blood loss 09/10/2019  . Kidney disease   . Neuropathy    feet and toes  . Sleep apnea    cpap     Past Surgical History:  Procedure Laterality Date  . APPENDECTOMY  yrs ago  . IRRIGATION AND DEBRIDEMENT FOOT Right 08/28/2019   Procedure: RIght foot wound incision and drainage, debridement and irrigation, bone biopsy, application of wound VAC;  Surgeon: Evelina Bucy, DPM;  Location: [facility]S;  Service: Podiatry;  Laterality: Right;    MEDICATIONS: No current facility-administered medications for this encounter.   Marland Kitchen aspirin 81 MG tablet  . atorvastatin (LIPITOR) 40 MG tablet  . CONTOUR NEXT TEST test strip  . ezetimibe (ZETIA) 10 MG tablet  .  ferrous sulfate 325 (65 FE) MG tablet  . Insulin Human (INSULIN PUMP) SOLN  . metoprolol succinate (TOPROL-XL) 50 MG 24 hr tablet  . olmesartan (BENICAR) 5 MG tablet  . omeprazole (PRILOSEC) 40 MG capsule  . senna-docusate (SENOKOT-S) 8.6-50 MG tablet  . tadalafil (CIALIS) 20 MG tablet  . Vitamin D, Ergocalciferol, (DRISDOL) 50000 UNITS CAPS capsule    Maia Plan [facility] Pre-Surgical Testing [phone] 10/15/19  1:29 PM

## 2019-10-16 ENCOUNTER — Other Ambulatory Visit: Payer: Self-pay

## 2019-10-16 ENCOUNTER — Ambulatory Visit (HOSPITAL_BASED_OUTPATIENT_CLINIC_OR_DEPARTMENT_OTHER): Payer: BC Managed Care – PPO | Admitting: Physician Assistant

## 2019-10-16 ENCOUNTER — Encounter: Payer: Self-pay | Admitting: Podiatry

## 2019-10-16 ENCOUNTER — Ambulatory Visit: Payer: BC Managed Care – PPO | Admitting: Podiatry

## 2019-10-16 ENCOUNTER — Ambulatory Visit (HOSPITAL_BASED_OUTPATIENT_CLINIC_OR_DEPARTMENT_OTHER)
Admission: RE | Admit: 2019-10-16 | Discharge: 2019-10-16 | Disposition: A | Discharge status: Home / Self Care | Payer: BC Managed Care – PPO | Attending: Podiatry | Admitting: Podiatry

## 2019-10-16 ENCOUNTER — Encounter (HOSPITAL_BASED_OUTPATIENT_CLINIC_OR_DEPARTMENT_OTHER): Payer: Self-pay | Admitting: Podiatry

## 2019-10-16 ENCOUNTER — Encounter (HOSPITAL_BASED_OUTPATIENT_CLINIC_OR_DEPARTMENT_OTHER)
Admission: RE | Disposition: A | Discharge status: Home / Self Care | Payer: Self-pay | Source: Home / Self Care | Attending: Podiatry

## 2019-10-16 DIAGNOSIS — I129 Hypertensive chronic kidney disease with stage 1 through stage 4 chronic kidney disease, or unspecified chronic kidney disease: Secondary | ICD-10-CM | POA: Insufficient documentation

## 2019-10-16 DIAGNOSIS — E1069 Type 1 diabetes mellitus with other specified complication: Secondary | ICD-10-CM | POA: Diagnosis not present

## 2019-10-16 DIAGNOSIS — E10621 Type 1 diabetes mellitus with foot ulcer: Secondary | ICD-10-CM | POA: Insufficient documentation

## 2019-10-16 DIAGNOSIS — E11621 Type 2 diabetes mellitus with foot ulcer: Secondary | ICD-10-CM | POA: Diagnosis not present

## 2019-10-16 DIAGNOSIS — Z794 Long term (current) use of insulin: Secondary | ICD-10-CM | POA: Diagnosis not present

## 2019-10-16 DIAGNOSIS — D631 Anemia in chronic kidney disease: Secondary | ICD-10-CM | POA: Diagnosis not present

## 2019-10-16 DIAGNOSIS — N1831 Chronic kidney disease, stage 3a: Secondary | ICD-10-CM | POA: Diagnosis not present

## 2019-10-16 DIAGNOSIS — Z79899 Other long term (current) drug therapy: Secondary | ICD-10-CM | POA: Diagnosis not present

## 2019-10-16 DIAGNOSIS — E1042 Type 1 diabetes mellitus with diabetic polyneuropathy: Secondary | ICD-10-CM | POA: Insufficient documentation

## 2019-10-16 DIAGNOSIS — E1022 Type 1 diabetes mellitus with diabetic chronic kidney disease: Secondary | ICD-10-CM | POA: Diagnosis not present

## 2019-10-16 DIAGNOSIS — G4730 Sleep apnea, unspecified: Secondary | ICD-10-CM | POA: Diagnosis not present

## 2019-10-16 DIAGNOSIS — M869 Osteomyelitis, unspecified: Secondary | ICD-10-CM | POA: Insufficient documentation

## 2019-10-16 DIAGNOSIS — Z7982 Long term (current) use of aspirin: Secondary | ICD-10-CM | POA: Diagnosis not present

## 2019-10-16 DIAGNOSIS — L97413 Non-pressure chronic ulcer of right heel and midfoot with necrosis of muscle: Secondary | ICD-10-CM | POA: Diagnosis not present

## 2019-10-16 HISTORY — PX: I & D EXTREMITY: SHX5045

## 2019-10-16 HISTORY — DX: Non-pressure chronic ulcer of right heel and midfoot limited to breakdown of skin: L97.411

## 2019-10-16 HISTORY — DX: Charcot's joint, right ankle and foot: M14.671

## 2019-10-16 HISTORY — DX: Essential (primary) hypertension: I10

## 2019-10-16 HISTORY — DX: Other acute osteomyelitis, right ankle and foot: M86.171

## 2019-10-16 HISTORY — DX: Sleep apnea, unspecified: G47.30

## 2019-10-16 HISTORY — DX: Hyperkalemia: E87.5

## 2019-10-16 HISTORY — DX: Type 1 diabetes mellitus without complications: E10.9

## 2019-10-16 HISTORY — DX: Polyneuropathy, unspecified: G62.9

## 2019-10-16 LAB — POCT I-STAT, CHEM 8
BUN: 24 mg/dL — ABNORMAL HIGH (ref 6–20)
Calcium, Ion: 1.37 mmol/L (ref 1.15–1.40)
Chloride: 101 mmol/L (ref 98–111)
Creatinine, Ser: 1.8 mg/dL — ABNORMAL HIGH (ref 0.61–1.24)
Glucose, Bld: 108 mg/dL — ABNORMAL HIGH (ref 70–99)
HCT: 38 % — ABNORMAL LOW (ref 39.0–52.0)
Hemoglobin: 12.9 g/dL — ABNORMAL LOW (ref 13.0–17.0)
Potassium: 4.4 mmol/L (ref 3.5–5.1)
Sodium: 140 mmol/L (ref 135–145)
TCO2: 32 mmol/L (ref 22–32)

## 2019-10-16 LAB — GLUCOSE, CAPILLARY
Glucose-Capillary: 76 mg/dL (ref 70–99)
Glucose-Capillary: 82 mg/dL (ref 70–99)

## 2019-10-16 SURGERY — IRRIGATION AND DEBRIDEMENT EXTREMITY
Anesthesia: Monitor Anesthesia Care | Site: Foot | Laterality: Right

## 2019-10-16 MED ORDER — FENTANYL CITRATE (PF) 100 MCG/2ML IJ SOLN
INTRAMUSCULAR | Status: AC
  Filled 2019-10-16: qty 2

## 2019-10-16 MED ORDER — CLINDAMYCIN PHOSPHATE 900 MG/50ML IV SOLN
900.0000 mg | INTRAVENOUS | Status: AC
  Administered 2019-10-16: 900 mg via INTRAVENOUS

## 2019-10-16 MED ORDER — MIDAZOLAM HCL 2 MG/2ML IJ SOLN
INTRAMUSCULAR | Status: AC
  Filled 2019-10-16: qty 2

## 2019-10-16 MED ORDER — PROPOFOL 500 MG/50ML IV EMUL
INTRAVENOUS | Status: DC | PRN
  Administered 2019-10-16: 100 ug/kg/min via INTRAVENOUS

## 2019-10-16 MED ORDER — OXYCODONE-ACETAMINOPHEN 5-325 MG PO TABS: 1.0000 | ORAL_TABLET | ORAL | 0 refills | Status: DC | PRN

## 2019-10-16 MED ORDER — SODIUM CHLORIDE 0.9 % IV SOLN: INTRAVENOUS | Status: DC

## 2019-10-16 MED ORDER — VANCOMYCIN HCL 1 G IV SOLR
INTRAVENOUS | Status: DC | PRN
  Administered 2019-10-16: 1000 mg

## 2019-10-16 MED ORDER — LIDOCAINE 2% (20 MG/ML) 5 ML SYRINGE
INTRAMUSCULAR | Status: DC | PRN
  Administered 2019-10-16: 40 mg via INTRAVENOUS

## 2019-10-16 MED ORDER — PROPOFOL 10 MG/ML IV BOLUS
INTRAVENOUS | Status: DC | PRN
  Administered 2019-10-16: 40 mg via INTRAVENOUS

## 2019-10-16 MED ORDER — ACETAMINOPHEN 500 MG PO TABS
ORAL_TABLET | ORAL | Status: AC
  Filled 2019-10-16: qty 2

## 2019-10-16 MED ORDER — OXYCODONE HCL 5 MG/5ML PO SOLN: 5.0000 mg | Freq: Once | ORAL | Status: DC | PRN

## 2019-10-16 MED ORDER — CIPROFLOXACIN HCL 500 MG PO TABS
500.0000 mg | ORAL_TABLET | Freq: Two times a day (BID) | ORAL | 0 refills | Status: AC
Start: 2019-10-16 — End: 2019-10-23

## 2019-10-16 MED ORDER — PROMETHAZINE HCL 25 MG/ML IJ SOLN: 6.2500 mg | INTRAMUSCULAR | Status: DC | PRN

## 2019-10-16 MED ORDER — PROPOFOL 10 MG/ML IV BOLUS
INTRAVENOUS | Status: AC
  Filled 2019-10-16: qty 20

## 2019-10-16 MED ORDER — LACTATED RINGERS IV SOLN: INTRAVENOUS | Status: DC

## 2019-10-16 MED ORDER — FENTANYL CITRATE (PF) 100 MCG/2ML IJ SOLN: 25.0000 ug | INTRAMUSCULAR | Status: DC | PRN

## 2019-10-16 MED ORDER — OXYCODONE HCL 5 MG PO TABS: 5.0000 mg | ORAL_TABLET | Freq: Once | ORAL | Status: DC | PRN

## 2019-10-16 MED ORDER — LIDOCAINE 2% (20 MG/ML) 5 ML SYRINGE
INTRAMUSCULAR | Status: AC
  Filled 2019-10-16: qty 5

## 2019-10-16 MED ORDER — BUPIVACAINE HCL (PF) 0.5 % IJ SOLN
INTRAMUSCULAR | Status: DC | PRN
  Administered 2019-10-16: 10 mL

## 2019-10-16 MED ORDER — CLINDAMYCIN PHOSPHATE 900 MG/50ML IV SOLN
INTRAVENOUS | Status: AC
  Filled 2019-10-16: qty 50

## 2019-10-16 MED ORDER — ACETAMINOPHEN 500 MG PO TABS
1000.0000 mg | ORAL_TABLET | Freq: Once | ORAL | Status: AC
  Administered 2019-10-16: 1000 mg via ORAL

## 2019-10-16 SURGICAL SUPPLY — 57 items
BLADE SURG 15 STRL LF DISP TIS (BLADE) ×1 IMPLANT
BLADE SURG 15 STRL SS (BLADE) ×1
BNDG ELASTIC 3X5.8 VLCR STR LF (GAUZE/BANDAGES/DRESSINGS) ×2 IMPLANT
BNDG ELASTIC 4X5.8 VLCR STR LF (GAUZE/BANDAGES/DRESSINGS) ×2 IMPLANT
BNDG ESMARK 4X9 LF (GAUZE/BANDAGES/DRESSINGS) IMPLANT
BNDG GAUZE ELAST 4 BULKY (GAUZE/BANDAGES/DRESSINGS) IMPLANT
CHLORAPREP W/TINT 26 (MISCELLANEOUS) IMPLANT
CNTNR URN SCR LID CUP LEK RST (MISCELLANEOUS) IMPLANT
CONT SPEC 4OZ STRL OR WHT (MISCELLANEOUS)
COVER BACK TABLE 60X90IN (DRAPES) ×2 IMPLANT
COVER WAND RF STERILE (DRAPES) IMPLANT
CUFF TOURN SGL QUICK 18X4 (TOURNIQUET CUFF) IMPLANT
CUFF TOURN SGL QUICK 24 (TOURNIQUET CUFF)
CUFF TRNQT CYL 24X4X16.5-23 (TOURNIQUET CUFF) IMPLANT
DRAPE 3/4 80X56 (DRAPES) ×2 IMPLANT
DRAPE EXTREMITY T 121X128X90 (DISPOSABLE) ×2 IMPLANT
DRAPE SHEET LG 3/4 BI-LAMINATE (DRAPES) ×2 IMPLANT
DRAPE U-SHAPE 47X51 STRL (DRAPES) ×2 IMPLANT
ELECT REM PT RETURN 9FT ADLT (ELECTROSURGICAL) ×2
ELECTRODE REM PT RTRN 9FT ADLT (ELECTROSURGICAL) ×1 IMPLANT
GAUZE SPONGE 4X4 12PLY STRL (GAUZE/BANDAGES/DRESSINGS) ×2 IMPLANT
GAUZE XEROFORM 1X8 LF (GAUZE/BANDAGES/DRESSINGS) IMPLANT
GLOVE BIO SURGEON STRL SZ7.5 (GLOVE) ×2 IMPLANT
GLOVE BIOGEL PI IND STRL 8 (GLOVE) ×1 IMPLANT
GLOVE BIOGEL PI INDICATOR 8 (GLOVE) ×1
GOWN STRL REUS W/ TWL XL LVL3 (GOWN DISPOSABLE) ×1 IMPLANT
GOWN STRL REUS W/TWL XL LVL3 (GOWN DISPOSABLE) ×1
IV NS 1000ML (IV SOLUTION) ×1
IV NS 1000ML BAXH (IV SOLUTION) ×1 IMPLANT
MANIFOLD NEPTUNE II (INSTRUMENTS) IMPLANT
NEEDLE HYPO 25X1 1.5 SAFETY (NEEDLE) ×2 IMPLANT
NS IRRIG 1000ML POUR BTL (IV SOLUTION) IMPLANT
NS IRRIG 500ML POUR BTL (IV SOLUTION) ×2 IMPLANT
PADDING CAST ABS 4INX4YD NS (CAST SUPPLIES) ×1
PADDING CAST ABS COTTON 4X4 ST (CAST SUPPLIES) ×1 IMPLANT
PENCIL SMOKE EVAC W/HOLSTER (ELECTROSURGICAL) ×2 IMPLANT
SET BASIN DAY SURGERY F.S. (CUSTOM PROCEDURE TRAY) ×2 IMPLANT
SET IRRIG Y TYPE TUR BLADDER L (SET/KITS/TRAYS/PACK) ×2 IMPLANT
SPONGE LAP 4X18 RFD (DISPOSABLE) ×2 IMPLANT
STAPLER VISISTAT 35W (STAPLE) IMPLANT
STOCKINETTE 6  STRL (DRAPES) ×1
STOCKINETTE 6 STRL (DRAPES) ×1 IMPLANT
SUCTION FRAZIER HANDLE 10FR (MISCELLANEOUS)
SUCTION TUBE FRAZIER 10FR DISP (MISCELLANEOUS) IMPLANT
SUT ETHILON 4 0 PS 2 18 (SUTURE) IMPLANT
SUT MNCRL AB 3-0 PS2 18 (SUTURE) IMPLANT
SUT MNCRL AB 4-0 PS2 18 (SUTURE) IMPLANT
SUT VIC AB 2-0 SH 27 (SUTURE)
SUT VIC AB 2-0 SH 27XBRD (SUTURE) IMPLANT
SWAB COLLECTION DEVICE MRSA (MISCELLANEOUS) ×2 IMPLANT
SWAB CULTURE ESWAB REG 1ML (MISCELLANEOUS) ×2 IMPLANT
SYR BULB EAR ULCER 3OZ GRN STR (SYRINGE) ×2 IMPLANT
SYR CONTROL 10ML LL (SYRINGE) ×2 IMPLANT
TRAY DSU PREP LF (CUSTOM PROCEDURE TRAY) ×2 IMPLANT
TUBE IRRIGATION SET MISONIX (TUBING) IMPLANT
UNDERPAD 30X36 HEAVY ABSORB (UNDERPADS AND DIAPERS) ×2 IMPLANT
YANKAUER SUCT BULB TIP NO VENT (SUCTIONS) ×2 IMPLANT

## 2019-10-16 NOTE — Discharge Instructions (Signed)
  Post Anesthesia Home Care Instructions  Activity: Get plenty of rest for the remainder of the day. A responsible individual must stay with you for 24 hours following the procedure.  For the next 24 hours, DO NOT: -Drive a car -Paediatric nurse -Drink alcoholic beverages -Take any medication unless instructed by your physician -Make any legal decisions or sign important papers.  Meals: Start with liquid foods such as gelatin or soup. Progress to regular foods as tolerated. Avoid greasy, spicy, heavy foods. If nausea and/or vomiting occur, drink only clear liquids until the nausea and/or vomiting subsides. Call your physician if vomiting continues.  Special Instructions/Symptoms: Your throat may feel dry or sore from the anesthesia or the breathing tube placed in your throat during surgery. If this causes discomfort, gargle with warm salt water. The discomfort should disappear within 24 hours.  If you had a scopolamine patch placed behind your ear for the management of post- operative nausea and/or vomiting:  1. The medication in the patch is effective for 72 hours, after which it should be removed.  Wrap patch in a tissue and discard in the trash. Wash hands thoroughly with soap and water. 2. You may remove the patch earlier than 72 hours if you experience unpleasant side effects which may include dry mouth, dizziness or visual disturbances. 3. Avoid touching the patch. Wash your hands with soap and water after contact with the patch.     Patient was given Instructions Sheet from [facility]

## 2019-10-16 NOTE — Op Note (Signed)
Patient Name: Javier Hunt. DOB: Jun 06, 1975  MRN: [id number]   Date of Service: 10/16/19   Surgeon: Dr. Hardie Pulley, DPM Assistants: None Pre-operative Diagnosis: Diabetic ulcer right heel Post-operative Diagnosis: same Procedures:             1) Debridement and irrigation right heel  2) Application of wound VAC Pathology/Specimens: ID Type Source Tests Collected by Time Destination  A : Wound swab right heel Wound Wound AEROBIC/ANAEROBIC CULTURE (SURGICAL/DEEP WOUND) Evelina Bucy, DPM 10/16/2019 1420    Anesthesia: MAC Hemostasis: Anatomic Estimated Blood Loss: 25 ml Materials: None Medications: 1g Vancomycin powder topical Complications: None  Indications for Procedure:  This is a 44 y.o. male with a chronic right heel wound who presents for repeat debridement to promote healing.   Procedure in Detail: Patient was identified in pre-operative holding area. Formal consent was signed and the right lower extremity was marked. Patient was brought back to the operating room and placed on the operating room table in the supine position. Anesthesia was induced.   The extremity was prepped and draped in the usual sterile fashion. Timeout was taken to confirm patient name, laterality, and procedure prior to incision. Attention was then directed to the right foot where a wound measuring 5x5 was encountered.  The wound was sharply excisionally debrided with a 15 blade, followed by a misonix ultrasonic debrider. Debridement was performed to bleeding, viable wound base. The wound was debrided to the level of the fascial tissue. Following debridement the wound measured 6x5.5.  A clean rongeur was used to take a sample of the deep soft tissue for microbiology.  A wound VAC was applied, with a black sponge adhered to the wound bed, followed by occlusive dressing. The trackpad was placed and set to the wound VAC machine at 125 mmHg.   The foot was then dressed ACE bandage. Patient  tolerated the procedure well.   Disposition: Following a period of post-operative monitoring, patient will be transferred back home.

## 2019-10-16 NOTE — Anesthesia Postprocedure Evaluation (Signed)
Anesthesia Post Note  Patient: Javier Hunt.  Procedure(s) Performed: IRRIGATION AND DEBRIDEMENT OF RIGHT HEEL WITH APPLICATION WOUND VAC (Right Foot)     Patient location during evaluation: PACU Anesthesia Type: MAC Level of consciousness: awake and alert and oriented Pain management: pain level controlled Vital Signs Assessment: post-procedure vital signs reviewed and stable Respiratory status: spontaneous breathing, nonlabored ventilation and respiratory function stable Cardiovascular status: blood pressure returned to baseline Postop Assessment: no apparent nausea or vomiting Anesthetic complications: no    Last Vitals:  Vitals:   10/16/19 1445 10/16/19 1500  BP: (!) 144/85 (!) 146/89  Pulse: 61 61  Resp: 12 17  Temp:    SpO2: 99% 99%    Last Pain:  Vitals:   10/16/19 1500  TempSrc:   PainSc: 0-No pain                 Brennan Bailey

## 2019-10-16 NOTE — Anesthesia Preprocedure Evaluation (Addendum)
Anesthesia Evaluation  Patient identified by MRN, date of birth, ID band Patient awake    Reviewed: Allergy & Precautions, NPO status , Patient's Chart, lab work & pertinent test results  History of Anesthesia Complications Negative for: history of anesthetic complications  Airway Mallampati: II  TM Distance: >3 FB Neck ROM: Full    Dental no notable dental hx.    Pulmonary sleep apnea and Continuous Positive Airway Pressure Ventilation ,    Pulmonary exam normal        Cardiovascular hypertension, negative cardio ROS Normal cardiovascular exam     Neuro/Psych negative neurological ROS  negative psych ROS   GI/Hepatic negative GI ROS, Neg liver ROS,   Endo/Other  diabetes, Type 1, Insulin Dependent  Renal/GU Renal InsufficiencyRenal disease (Cr 1.76)  negative genitourinary   Musculoskeletal Osteomyelitis of right calcaneous   Abdominal   Peds  Hematology  (+) anemia , Hgb 11.5   Anesthesia Other Findings Day of surgery medications reviewed with patient.  Reproductive/Obstetrics negative OB ROS                            Anesthesia Physical Anesthesia Plan  ASA: II  Anesthesia Plan: MAC   Post-op Pain Management:    Induction:   PONV Risk Score and Plan: 1 and Treatment may vary due to age or medical condition, Propofol infusion, Ondansetron and Midazolam  Airway Management Planned: Natural Airway and Simple Face Mask  Additional Equipment: None  Intra-op Plan:   Post-operative Plan:   Informed Consent: I have reviewed the patients History and Physical, chart, labs and discussed the procedure including the risks, benefits and alternatives for the proposed anesthesia with the patient or authorized representative who has indicated his/her understanding and acceptance.       Plan Discussed with: CRNA  Anesthesia Plan Comments:        Anesthesia Quick Evaluation

## 2019-10-16 NOTE — Interval H&P Note (Signed)
History and Physical Interval Note:  10/16/2019 1:45 PM  Javier Hunt.  has presented today for surgery, with the diagnosis of DIABETIC ULCER OF HEEL.  The various methods of treatment have been discussed with the patient and family. After consideration of risks, benefits and other options for treatment, the patient has consented to  Procedure(s): IRRIGATION AND DEBRIDEMENT OF RIGHT HEEL WITH APPLICATION WOUND VAC (Right) as a surgical intervention.  The patient's history has been reviewed, patient examined, no change in status, stable for surgery.  I have reviewed the patient's chart and labs.  Questions were answered to the patient's satisfaction.     Evelina Bucy

## 2019-10-16 NOTE — H&P (Signed)
Anesthesia H&P Update: History and Physical Exam reviewed; patient is OK for planned anesthetic and procedure. ? ?

## 2019-10-16 NOTE — Transfer of Care (Signed)
Immediate Anesthesia Transfer of Care Note  Patient: Javier Hunt.  Procedure(s) Performed: Procedure(s) (LRB): IRRIGATION AND DEBRIDEMENT OF RIGHT HEEL WITH APPLICATION WOUND VAC (Right)  Patient Location: PACU  Anesthesia Type: MAC  Level of Consciousness: awake, alert , oriented and patient cooperative  Airway & Oxygen Therapy: Patient Spontanous Breathing and Patient connected to face mask oxygen  Post-op Assessment: Report given to PACU RN and Post -op Vital signs reviewed and stable  Post vital signs: Reviewed and stable  Complications: No apparent anesthesia complications Last Vitals:  Vitals Value Taken Time  BP    Temp    Pulse    Resp    SpO2      Last Pain:  Vitals:   10/16/19 1202  TempSrc: Oral  PainSc: 0-No pain      Patients Stated Pain Goal: 7 (10/16/19 1202)

## 2019-10-21 LAB — AEROBIC/ANAEROBIC CULTURE W GRAM STAIN (SURGICAL/DEEP WOUND): Gram Stain: NONE SEEN

## 2019-10-22 ENCOUNTER — Ambulatory Visit: Payer: BC Managed Care – PPO | Admitting: Vascular Surgery

## 2019-10-22 ENCOUNTER — Encounter (HOSPITAL_COMMUNITY): Payer: BC Managed Care – PPO

## 2019-10-23 ENCOUNTER — Ambulatory Visit (INDEPENDENT_AMBULATORY_CARE_PROVIDER_SITE_OTHER): Payer: BC Managed Care – PPO | Admitting: Podiatry

## 2019-10-23 ENCOUNTER — Other Ambulatory Visit: Payer: Self-pay

## 2019-10-23 ENCOUNTER — Encounter (HOSPITAL_BASED_OUTPATIENT_CLINIC_OR_DEPARTMENT_OTHER): Payer: Self-pay | Admitting: Podiatry

## 2019-10-23 DIAGNOSIS — L97413 Non-pressure chronic ulcer of right heel and midfoot with necrosis of muscle: Secondary | ICD-10-CM

## 2019-10-23 DIAGNOSIS — M14671 Charcot's joint, right ankle and foot: Secondary | ICD-10-CM

## 2019-10-23 DIAGNOSIS — E11621 Type 2 diabetes mellitus with foot ulcer: Secondary | ICD-10-CM

## 2019-10-23 NOTE — Progress Notes (Signed)
  Subjective:  Patient ID: Javier Hunt., male    DOB: Feb 18, 1976,  MRN: [id number]  No chief complaint on file.  44 y.o. male presents for wound care f/u. States the wound is doing ok. No longer with odor. Objective:  Physical Exam: Wound Location: right heel Wound Measurement: 5x5 Wound Base: mixed granular/fibrotic (fibrotic centrally) Peri-wound: Normal Exudate: Scant/small amount Serosanguinous exudate Wound without warmth erythema, signs of infection today. No malodor. No green appearance c/t prior.   Assessment:   1. Diabetic ulcer of right heel associated with type 2 diabetes mellitus, with necrosis of muscle ([place])   2. Charcot ankle, right    Plan:  Patient was evaluated and treated and all questions answered.  Ulcer right foot -Wound without odor today. Mostly granular base but still quite large. Wound culture with many organisms showing colonization. Would benefit from repeat debridement, wound VAC application, possible skin graft substitute.  Procedure: Selective Debridement of Wound Rationale: Removal of devitalized tissue from the wound to promote healing.  Pre-Debridement Wound Measurements: 5 cm x 5 cm x 0.5 cm  Post-Debridement Wound Measurements: same as pre-debridement. Type of Debridement: sharp selective Tissue Removed: Devitalized soft-tissue Dressing: Dry, sterile, compression dressing. Disposition: Patient tolerated procedure well. Patient to return in 1 week for follow-up.   Procedure: Wound VAC Application Location: right heel Wound Measurement: 5 cm x 5 cm x 0.5 cm  Technique: Black foam to wound base, followed by adherent dressing. Set to 125 mmHg with good seal noted. Disposition: Patient tolerated procedure well.   No follow-ups on file.

## 2019-10-23 NOTE — Patient Instructions (Signed)
Pre-Operative Instructions  Congratulations, you have decided to take an important step towards improving your quality of life.  You can be assured that the doctors and staff at [facility] will be with you every step of the way.  Here are some important things you should know:  1. Plan to be at the surgery center/hospital at least 1 (one) hour prior to your scheduled time, unless otherwise directed by the surgical center/hospital staff.  You must have a responsible adult accompany you, remain during the surgery and drive you home.  Make sure you have directions to the surgical center/hospital to ensure you arrive on time. 2. If you are having surgery at [facility] or [facility] hospitals, you will need a copy of your medical history and physical form from your family physician within one month prior to the date of surgery. We will give you a form for your primary physician to complete.  3. We make every effort to accommodate the date you request for surgery.  However, there are times where surgery dates or times have to be moved.  We will contact you as soon as possible if a change in schedule is required.   4. No aspirin/ibuprofen for one week before surgery.  If you are on aspirin, any non-steroidal anti-inflammatory medications (Mobic, Aleve, Ibuprofen) should not be taken seven (7) days prior to your surgery.  You make take Tylenol for pain prior to surgery.  5. Medications - If you are taking daily heart and blood pressure medications, seizure, reflux, allergy, asthma, anxiety, pain or diabetes medications, make sure you notify the surgery center/hospital before the day of surgery so they can tell you which medications you should take or avoid the day of surgery. 6. No food or drink after midnight the night before surgery unless directed otherwise by surgical center/hospital staff. 7. No alcoholic beverages 24-hours prior to surgery.  No smoking 24-hours prior or 24-hours after  surgery. 8. Wear loose pants or shorts. They should be loose enough to fit over bandages, boots, and casts. 9. Don't wear slip-on shoes. Sneakers are preferred. 10. Bring your boot with you to the surgery center/hospital.  Also bring crutches or a walker if your physician has prescribed it for you.  If you do not have this equipment, it will be provided for you after surgery. 11. If you have not been contacted by the surgery center/hospital by the day before your surgery, call to confirm the date and time of your surgery. 12. Leave-time from work may vary depending on the type of surgery you have.  Appropriate arrangements should be made prior to surgery with your employer. 13. Prescriptions will be provided immediately following surgery by your doctor.  Fill these as soon as possible after surgery and take the medication as directed. Pain medications will not be refilled on weekends and must be approved by the doctor. 14. Remove nail polish on the operative foot and avoid getting pedicures prior to surgery. 15. Wash the night before surgery.  The night before surgery wash the foot and leg well with water and the antibacterial soap provided. Be sure to pay special attention to beneath the toenails and in between the toes.  Wash for at least three (3) minutes. Rinse thoroughly with water and dry well with a towel.  Perform this wash unless told not to do so by your physician.  Enclosed: 1 Ice pack (please put in freezer the night before surgery)   1 Hibiclens skin cleaner     Pre-op instructions  If you have any questions regarding the instructions, please do not hesitate to call our office.  [place]: [address], [place], [place] [address] -- [phone]  [place]: [address] , [place] [address] -- [phone]  [place]: [address], [place], [place] [address] -- [phone]   Website: [email or web address] 

## 2019-10-23 NOTE — H&P (View-Only) (Signed)
  Subjective:  Patient ID: Javier Hunt., male    DOB: 02-03-1976,  MRN: [id number]  No chief complaint on file.  44 y.o. male presents for wound care f/u. States the wound is doing ok. No longer with odor. Objective:  Physical Exam: Wound Location: right heel Wound Measurement: 5x5 Wound Base: mixed granular/fibrotic (fibrotic centrally) Peri-wound: Normal Exudate: Scant/small amount Serosanguinous exudate Wound without warmth erythema, signs of infection today. No malodor. No green appearance c/t prior.   Assessment:   1. Diabetic ulcer of right heel associated with type 2 diabetes mellitus, with necrosis of muscle ([place])   2. Charcot ankle, right    Plan:  Patient was evaluated and treated and all questions answered.  Ulcer right foot -Wound without odor today. Mostly granular base but still quite large. Wound culture with many organisms showing colonization. Would benefit from repeat debridement, wound VAC application, possible skin graft substitute.  Procedure: Selective Debridement of Wound Rationale: Removal of devitalized tissue from the wound to promote healing.  Pre-Debridement Wound Measurements: 5 cm x 5 cm x 0.5 cm  Post-Debridement Wound Measurements: same as pre-debridement. Type of Debridement: sharp selective Tissue Removed: Devitalized soft-tissue Dressing: Dry, sterile, compression dressing. Disposition: Patient tolerated procedure well. Patient to return in 1 week for follow-up.   Procedure: Wound VAC Application Location: right heel Wound Measurement: 5 cm x 5 cm x 0.5 cm  Technique: Black foam to wound base, followed by adherent dressing. Set to 125 mmHg with good seal noted. Disposition: Patient tolerated procedure well.   No follow-ups on file.

## 2019-10-23 NOTE — Progress Notes (Addendum)
Spoke with Janett Billow zanetto pa ok to proceed. Note in epic from dr price dated 10-26-2019 ok to use h&p from 10-13-2019 for 10-29-2019 surgery.  Spoke w/ via phone for pre-op interview---patient Lab needs dos----    I stat 8           COVID test ------10-24-2019@1130  am Arrive at -------1415 pm 10-28-2019 No food after midnight, clear liquids with no milk or dairy products from midnight until 815 am then npo Medications to take morning of surgery -----zetia, metoprolol succinate, omeprazole Diabetic medication -----patient leaving insulin pump at basal rate day of surgery Patient Special Instructions -----bring cpap mask tubing and maching and leave in car Pre-Op special Istructions -----bring insulin pump supplies day of surgery Patient verbalized understanding of instructions that were given at this phone interview. Patient denies shortness of breath, chest pain, fever, cough a this phone interview.  Anesthesia : patient worked up by anesthesia 10-15-2019 Chart to Janett Billow zanetto pa for review  PCP: dr [name] Nephrology lov dr Arty Baumgartner 09-23-2019 on chart and epic under media Has insulin pump managed by dr Lauraine Rinne medical Cardiologist : none Chest x-ray : none EKG :10-16-2019 Echo : none Cardiac Cath : none Sleep Study/ CPAP : uses cpap, pt not sure setting, not sure where sleep study done 4 yrs ago Fasting Blood Sugar : 80-140     / Checks Blood Sugar -4 times a day:   Blood Thinner/ Instructions /Last Dose:n/a ASA / Instructions/ Last Dose : patient stayed on 81 mg aspirin per dr price for 10-16-2019 surgery  Patient denies shortness of breath, chest pain, fever, and cough at this phone interview.

## 2019-10-24 ENCOUNTER — Other Ambulatory Visit (HOSPITAL_COMMUNITY)
Admission: RE | Admit: 2019-10-24 | Discharge: 2019-10-24 | Disposition: A | Discharge status: Home / Self Care | Payer: BC Managed Care – PPO | Source: Ambulatory Visit | Attending: Podiatry | Admitting: Podiatry

## 2019-10-24 DIAGNOSIS — Z01812 Encounter for preprocedural laboratory examination: Secondary | ICD-10-CM | POA: Insufficient documentation

## 2019-10-24 DIAGNOSIS — Z20822 Contact with and (suspected) exposure to covid-19: Secondary | ICD-10-CM | POA: Diagnosis not present

## 2019-10-24 LAB — SARS CORONAVIRUS 2 (TAT 6-24 HRS): SARS Coronavirus 2: NEGATIVE

## 2019-10-26 NOTE — H&P (Signed)
Patient's 5/11 H&P sufficient for this week's surgery. Still within 30 days. No interval changes.

## 2019-10-27 ENCOUNTER — Telehealth: Payer: Self-pay

## 2019-10-27 NOTE — Progress Notes (Signed)
Called and spoke w/ pt via phone about his procedure start time being moved to 1345 and surgery is having him to arrive @ 1130.  Pt verbalized understanding that he needs to be npo after mn with exception clear liquids until 0730 then nothing by mouth due to procedure time change.

## 2019-10-27 NOTE — Telephone Encounter (Signed)
DOS 10/28/19  DEBRIDEMENT & IRRIGATION RIGHT HEEL - 69794 & [id number] APPLICATION WOUND VAC RT - 53748 APPLICATION SKIN GRAFT - 15100  [employer] EFFECTIVE DATE - 06/05/2019    In-Network   Max Per Benefit Period           Year-to-Date Remaining     CoInsurance                             Deductible   $750.00           $0.00     Out-Of-Pocket 3      $1,500.00        $27.07  Copay  Not Applicable Coinsurance    25% Authorization Required YES  Received auth from [employer] # [phone]

## 2019-10-28 ENCOUNTER — Encounter (HOSPITAL_BASED_OUTPATIENT_CLINIC_OR_DEPARTMENT_OTHER)
Admission: RE | Disposition: A | Discharge status: Home / Self Care | Payer: Self-pay | Source: Home / Self Care | Attending: Podiatry

## 2019-10-28 ENCOUNTER — Encounter (HOSPITAL_BASED_OUTPATIENT_CLINIC_OR_DEPARTMENT_OTHER): Payer: Self-pay | Admitting: Podiatry

## 2019-10-28 ENCOUNTER — Ambulatory Visit (HOSPITAL_BASED_OUTPATIENT_CLINIC_OR_DEPARTMENT_OTHER): Payer: BC Managed Care – PPO | Admitting: Physician Assistant

## 2019-10-28 ENCOUNTER — Ambulatory Visit (HOSPITAL_BASED_OUTPATIENT_CLINIC_OR_DEPARTMENT_OTHER)
Admission: RE | Admit: 2019-10-28 | Discharge: 2019-10-28 | Disposition: A | Discharge status: Home / Self Care | Payer: BC Managed Care – PPO | Attending: Podiatry | Admitting: Podiatry

## 2019-10-28 DIAGNOSIS — E10610 Type 1 diabetes mellitus with diabetic neuropathic arthropathy: Secondary | ICD-10-CM | POA: Diagnosis not present

## 2019-10-28 DIAGNOSIS — G4730 Sleep apnea, unspecified: Secondary | ICD-10-CM | POA: Insufficient documentation

## 2019-10-28 DIAGNOSIS — E1022 Type 1 diabetes mellitus with diabetic chronic kidney disease: Secondary | ICD-10-CM | POA: Insufficient documentation

## 2019-10-28 DIAGNOSIS — I129 Hypertensive chronic kidney disease with stage 1 through stage 4 chronic kidney disease, or unspecified chronic kidney disease: Secondary | ICD-10-CM | POA: Insufficient documentation

## 2019-10-28 DIAGNOSIS — D631 Anemia in chronic kidney disease: Secondary | ICD-10-CM | POA: Diagnosis not present

## 2019-10-28 DIAGNOSIS — E10621 Type 1 diabetes mellitus with foot ulcer: Secondary | ICD-10-CM | POA: Diagnosis not present

## 2019-10-28 DIAGNOSIS — L97413 Non-pressure chronic ulcer of right heel and midfoot with necrosis of muscle: Secondary | ICD-10-CM | POA: Diagnosis not present

## 2019-10-28 DIAGNOSIS — E11621 Type 2 diabetes mellitus with foot ulcer: Secondary | ICD-10-CM

## 2019-10-28 DIAGNOSIS — N1832 Chronic kidney disease, stage 3b: Secondary | ICD-10-CM | POA: Insufficient documentation

## 2019-10-28 HISTORY — PX: GRAFT APPLICATION: SHX6696

## 2019-10-28 HISTORY — PX: INCISION AND DRAINAGE OF WOUND: SHX1803

## 2019-10-28 LAB — POCT I-STAT, CHEM 8
BUN: 25 mg/dL — ABNORMAL HIGH (ref 6–20)
Calcium, Ion: 1.3 mmol/L (ref 1.15–1.40)
Chloride: 103 mmol/L (ref 98–111)
Creatinine, Ser: 1.9 mg/dL — ABNORMAL HIGH (ref 0.61–1.24)
Glucose, Bld: 103 mg/dL — ABNORMAL HIGH (ref 70–99)
HCT: 37 % — ABNORMAL LOW (ref 39.0–52.0)
Hemoglobin: 12.6 g/dL — ABNORMAL LOW (ref 13.0–17.0)
Potassium: 4.5 mmol/L (ref 3.5–5.1)
Sodium: 139 mmol/L (ref 135–145)
TCO2: 30 mmol/L (ref 22–32)

## 2019-10-28 LAB — GLUCOSE, CAPILLARY
Glucose-Capillary: 60 mg/dL — ABNORMAL LOW (ref 70–99)
Glucose-Capillary: 77 mg/dL (ref 70–99)

## 2019-10-28 SURGERY — IRRIGATION AND DEBRIDEMENT WOUND
Anesthesia: Monitor Anesthesia Care | Site: Foot | Laterality: Right

## 2019-10-28 MED ORDER — PROPOFOL 10 MG/ML IV BOLUS
INTRAVENOUS | Status: DC | PRN
  Administered 2019-10-28: 50 mg via INTRAVENOUS

## 2019-10-28 MED ORDER — BUPIVACAINE HCL (PF) 0.5 % IJ SOLN
INTRAMUSCULAR | Status: AC
  Filled 2019-10-28: qty 30

## 2019-10-28 MED ORDER — VANCOMYCIN HCL 1000 MG IV SOLR
INTRAVENOUS | Status: AC
  Filled 2019-10-28: qty 1000

## 2019-10-28 MED ORDER — BUPIVACAINE HCL (PF) 0.5 % IJ SOLN
INTRAMUSCULAR | Status: DC | PRN
  Administered 2019-10-28: 10 mL

## 2019-10-28 MED ORDER — MIDAZOLAM HCL 2 MG/2ML IJ SOLN
INTRAMUSCULAR | Status: AC
  Filled 2019-10-28: qty 2

## 2019-10-28 MED ORDER — PROPOFOL 500 MG/50ML IV EMUL
INTRAVENOUS | Status: AC
  Filled 2019-10-28: qty 50

## 2019-10-28 MED ORDER — CLINDAMYCIN HCL 150 MG PO CAPS
150.0000 mg | ORAL_CAPSULE | Freq: Two times a day (BID) | ORAL | 0 refills | Status: DC
Start: 2019-10-28 — End: 2020-09-07

## 2019-10-28 MED ORDER — ORAL CARE MOUTH RINSE: 15.0000 mL | Freq: Once | OROMUCOSAL | Status: DC

## 2019-10-28 MED ORDER — OXYCODONE-ACETAMINOPHEN 5-325 MG PO TABS: 1.0000 | ORAL_TABLET | ORAL | 0 refills | Status: DC | PRN

## 2019-10-28 MED ORDER — CLINDAMYCIN PHOSPHATE 900 MG/50ML IV SOLN
900.0000 mg | INTRAVENOUS | Status: AC
  Administered 2019-10-28: 900 mg via INTRAVENOUS

## 2019-10-28 MED ORDER — CHLORHEXIDINE GLUCONATE 0.12 % MT SOLN: 15.0000 mL | Freq: Once | OROMUCOSAL | Status: DC

## 2019-10-28 MED ORDER — FENTANYL CITRATE (PF) 100 MCG/2ML IJ SOLN
INTRAMUSCULAR | Status: AC
  Filled 2019-10-28: qty 2

## 2019-10-28 MED ORDER — PROPOFOL 500 MG/50ML IV EMUL
INTRAVENOUS | Status: DC | PRN
  Administered 2019-10-28: 50 ug/kg/min via INTRAVENOUS

## 2019-10-28 MED ORDER — LIDOCAINE 2% (20 MG/ML) 5 ML SYRINGE
INTRAMUSCULAR | Status: AC
  Filled 2019-10-28: qty 5

## 2019-10-28 MED ORDER — MIDAZOLAM HCL 5 MG/5ML IJ SOLN
INTRAMUSCULAR | Status: DC | PRN
  Administered 2019-10-28: 2 mg via INTRAVENOUS

## 2019-10-28 MED ORDER — FENTANYL CITRATE (PF) 100 MCG/2ML IJ SOLN
INTRAMUSCULAR | Status: DC | PRN
  Administered 2019-10-28: 25 ug via INTRAVENOUS

## 2019-10-28 MED ORDER — ONDANSETRON HCL 4 MG/2ML IJ SOLN
INTRAMUSCULAR | Status: DC | PRN
  Administered 2019-10-28: 4 mg via INTRAVENOUS

## 2019-10-28 MED ORDER — CLINDAMYCIN PHOSPHATE 900 MG/50ML IV SOLN
INTRAVENOUS | Status: AC
  Filled 2019-10-28: qty 50

## 2019-10-28 MED ORDER — LIDOCAINE HCL (CARDIAC) PF 100 MG/5ML IV SOSY
PREFILLED_SYRINGE | INTRAVENOUS | Status: DC | PRN
  Administered 2019-10-28: 40 mg via INTRAVENOUS

## 2019-10-28 MED ORDER — SODIUM CHLORIDE 0.9 % IR SOLN
Status: DC | PRN
  Administered 2019-10-28: 500 mL

## 2019-10-28 MED ORDER — SODIUM CHLORIDE 0.9 % IV SOLN
INTRAVENOUS | Status: DC
  Administered 2019-10-28: 50 mL/h via INTRAVENOUS

## 2019-10-28 MED ORDER — ONDANSETRON HCL 4 MG/2ML IJ SOLN
INTRAMUSCULAR | Status: AC
  Filled 2019-10-28: qty 2

## 2019-10-28 MED ORDER — FENTANYL CITRATE (PF) 100 MCG/2ML IJ SOLN: 25.0000 ug | INTRAMUSCULAR | Status: DC | PRN

## 2019-10-28 SURGICAL SUPPLY — 58 items
BLADE SURG 15 STRL LF DISP TIS (BLADE) ×2 IMPLANT
BLADE SURG 15 STRL SS (BLADE) ×1
BNDG ELASTIC 3X5.8 VLCR STR LF (GAUZE/BANDAGES/DRESSINGS) ×3 IMPLANT
BNDG ELASTIC 4X5.8 VLCR STR LF (GAUZE/BANDAGES/DRESSINGS) ×3 IMPLANT
BNDG ESMARK 4X9 LF (GAUZE/BANDAGES/DRESSINGS) IMPLANT
BNDG GAUZE ELAST 4 BULKY (GAUZE/BANDAGES/DRESSINGS) ×3 IMPLANT
CHLORAPREP W/TINT 26 (MISCELLANEOUS) IMPLANT
CNTNR URN SCR LID CUP LEK RST (MISCELLANEOUS) IMPLANT
CONT SPEC 4OZ STRL OR WHT (MISCELLANEOUS)
COVER BACK TABLE 60X90IN (DRAPES) ×3 IMPLANT
COVER WAND RF STERILE (DRAPES) ×3 IMPLANT
CUFF TOURN SGL QUICK 18X4 (TOURNIQUET CUFF) IMPLANT
CUFF TOURN SGL QUICK 24 (TOURNIQUET CUFF)
CUFF TRNQT CYL 24X4X16.5-23 (TOURNIQUET CUFF) IMPLANT
DRAPE 3/4 80X56 (DRAPES) ×3 IMPLANT
DRAPE EXTREMITY T 121X128X90 (DISPOSABLE) ×3 IMPLANT
DRAPE SHEET LG 3/4 BI-LAMINATE (DRAPES) ×3 IMPLANT
DRAPE U-SHAPE 47X51 STRL (DRAPES) ×3 IMPLANT
DRSG ADAPTIC 3X8 NADH LF (GAUZE/BANDAGES/DRESSINGS) ×1 IMPLANT
DRSG VAC ATS MED SENSATRAC (GAUZE/BANDAGES/DRESSINGS) ×1 IMPLANT
ELECT REM PT RETURN 9FT ADLT (ELECTROSURGICAL) ×3
ELECTRODE REM PT RTRN 9FT ADLT (ELECTROSURGICAL) ×2 IMPLANT
GAUZE SPONGE 4X4 12PLY STRL (GAUZE/BANDAGES/DRESSINGS) ×3 IMPLANT
GAUZE XEROFORM 1X8 LF (GAUZE/BANDAGES/DRESSINGS) IMPLANT
GLOVE BIO SURGEON STRL SZ7.5 (GLOVE) ×3 IMPLANT
GLOVE BIOGEL PI IND STRL 8 (GLOVE) ×2 IMPLANT
GLOVE BIOGEL PI INDICATOR 8 (GLOVE) ×1
GOWN STRL REUS W/ TWL XL LVL3 (GOWN DISPOSABLE) ×2 IMPLANT
GOWN STRL REUS W/TWL XL LVL3 (GOWN DISPOSABLE) ×1
MANIFOLD NEPTUNE II (INSTRUMENTS) ×3 IMPLANT
MATRIX WOUND FLOWABLE 3CC (Tissue) ×1 IMPLANT
MATRIX WOUND MESHED 2X2 (Tissue) IMPLANT
NDL HYPO 25X1 1.5 SAFETY (NEEDLE) ×2 IMPLANT
NEEDLE HYPO 25X1 1.5 SAFETY (NEEDLE) ×3 IMPLANT
NS IRRIG 1000ML POUR BTL (IV SOLUTION) IMPLANT
PADDING CAST ABS 4INX4YD NS (CAST SUPPLIES) ×1
PADDING CAST ABS COTTON 4X4 ST (CAST SUPPLIES) ×2 IMPLANT
PENCIL SMOKE EVAC W/HOLSTER (ELECTROSURGICAL) ×3 IMPLANT
SET BASIN DAY SURGERY F.S. (CUSTOM PROCEDURE TRAY) ×3 IMPLANT
SET IRRIG Y TYPE TUR BLADDER L (SET/KITS/TRAYS/PACK) IMPLANT
SPONGE LAP 4X18 RFD (DISPOSABLE) ×3 IMPLANT
STAPLER VISISTAT 35W (STAPLE) IMPLANT
STOCKINETTE 6  STRL (DRAPES) ×1
STOCKINETTE 6 STRL (DRAPES) ×2 IMPLANT
SUCTION FRAZIER HANDLE 10FR (MISCELLANEOUS)
SUCTION TUBE FRAZIER 10FR DISP (MISCELLANEOUS) IMPLANT
SUT ETHILON 4 0 PS 2 18 (SUTURE) IMPLANT
SUT MNCRL AB 3-0 PS2 18 (SUTURE) IMPLANT
SUT MNCRL AB 4-0 PS2 18 (SUTURE) IMPLANT
SUT VIC AB 2-0 SH 27 (SUTURE)
SUT VIC AB 2-0 SH 27XBRD (SUTURE) IMPLANT
SYR BULB EAR ULCER 3OZ GRN STR (SYRINGE) ×3 IMPLANT
SYR CONTROL 10ML LL (SYRINGE) ×3 IMPLANT
TRAY DSU PREP LF (CUSTOM PROCEDURE TRAY) ×3 IMPLANT
TUBE IRRIGATION SET MISONIX (TUBING) IMPLANT
UNDERPAD 30X36 HEAVY ABSORB (UNDERPADS AND DIAPERS) ×3 IMPLANT
WOUND MATRIX MESHED 2X2 (Tissue) ×1 IMPLANT
YANKAUER SUCT BULB TIP NO VENT (SUCTIONS) ×3 IMPLANT

## 2019-10-28 NOTE — Anesthesia Procedure Notes (Signed)
Procedure Name: [place] Performed by: Mechele Claude, CRNA Pre-anesthesia Checklist: Patient identified, Emergency Drugs available, Suction available, Patient being monitored and Timeout performed Placement Confirmation: positive ETCO2,  CO2 detector and breath sounds checked- equal and bilateral

## 2019-10-28 NOTE — Anesthesia Preprocedure Evaluation (Signed)
Anesthesia Evaluation  Patient identified by MRN, date of birth, ID band Patient awake    Reviewed: Allergy & Precautions, NPO status , Patient's Chart, lab work & pertinent test results  Airway Mallampati: II  TM Distance: >3 FB Neck ROM: Full    Dental no notable dental hx.    Pulmonary sleep apnea and Continuous Positive Airway Pressure Ventilation ,    Pulmonary exam normal breath sounds clear to auscultation       Cardiovascular hypertension, Pt. on medications and Pt. on home beta blockers Normal cardiovascular exam Rhythm:Regular Rate:Normal     Neuro/Psych negative neurological ROS  negative psych ROS   GI/Hepatic negative GI ROS, Neg liver ROS,   Endo/Other  diabetes  Renal/GU Renal InsufficiencyRenal disease  negative genitourinary   Musculoskeletal negative musculoskeletal ROS (+)   Abdominal   Peds negative pediatric ROS (+)  Hematology  (+) anemia ,   Anesthesia Other Findings   Reproductive/Obstetrics negative OB ROS                             Anesthesia Physical Anesthesia Plan  ASA: III  Anesthesia Plan: MAC   Post-op Pain Management:    Induction: Intravenous  PONV Risk Score and Plan:   Airway Management Planned: Simple Face Mask  Additional Equipment:   Intra-op Plan:   Post-operative Plan:   Informed Consent: I have reviewed the patients History and Physical, chart, labs and discussed the procedure including the risks, benefits and alternatives for the proposed anesthesia with the patient or authorized representative who has indicated his/her understanding and acceptance.     Dental advisory given  Plan Discussed with: CRNA and Surgeon  Anesthesia Plan Comments:         Anesthesia Quick Evaluation

## 2019-10-28 NOTE — Anesthesia Postprocedure Evaluation (Signed)
Anesthesia Post Note  Patient: Javier Hunt.  Procedure(s) Performed: IRRIGATION AND DEBRIDEMENT WOUND RIGHT HEEL (Right Foot) GRAFT APPLICATION (Right )     Patient location during evaluation: PACU Anesthesia Type: MAC Level of consciousness: awake and alert Pain management: pain level controlled Vital Signs Assessment: post-procedure vital signs reviewed and stable Respiratory status: spontaneous breathing, nonlabored ventilation and respiratory function stable Cardiovascular status: stable and blood pressure returned to baseline Anesthetic complications: no    Last Vitals:  Vitals:   10/28/19 1345 10/28/19 1352  BP: (!) 152/88   Pulse: 61 62  Resp: 14 17  Temp:    SpO2: 100% 99%    Last Pain:  Vitals:   10/28/19 1352  TempSrc:   PainSc: 0-No pain                 Audry Pili

## 2019-10-28 NOTE — Op Note (Signed)
Patient Name: Javier Hunt. DOB: 1975/06/09  MRN: [id number]   Date of Service: 10/28/19    Surgeon: Dr. Hardie Pulley, DPM Assistants: None Pre-operative Diagnosis: Diabetic ulcer right heel Post-operative Diagnosis: same Procedures:             1) Debridement and Irrigation of Right foot wound  2) Application of skin graft substitute  3) Application of wound VAC Pathology/Specimens: ID Type Source Tests Collected by Time Destination  A : right heel wound Wound Wound AEROBIC/ANAEROBIC CULTURE (SURGICAL/DEEP WOUND) Evelina Bucy, DPM 10/28/2019 1232    Anesthesia: MAC/local Hemostasis: Anatomic Estimated Blood Loss: 10 ml Materials:  Implant Name Type Inv. Item Serial No. Manufacturer Lot No. LRB No. Used Action  MATRIX WOUND FLOWABLE 3CC - [id number] Tissue MATRIX WOUND FLOWABLE [facility]  [employer] [id number] Right 1 Implanted  WOUND MATRIX MESHED 2X2 - [id number] Tissue WOUND MATRIX MESHED 2X2  [employer] [id number] Right 1 Implanted    Medications: none Complications: None  Indications for Procedure:  This is a 44 y.o. male with a chronic right heel wound needing debridement to promote healing.   Procedure in Detail: Patient was identified in pre-operative holding area. Formal consent was signed and the right lower extremity was marked. Patient was brought back to the operating room and placed on the operating room table in the supine position. Anesthesia was induced.   The extremity was prepped and draped in the usual sterile fashion. Timeout was taken to confirm patient name, laterality, and procedure prior to incision. Attention was then directed to the right foot where a wound measuring 5x5 was encountered. The central aspect of the wound was cultured. This did probe deep to the bone.  The wound was sharply excisionally debrided with a 15 blade, followed by a misonix ultrasonic debrider. Debridement was performed to bleeding, viable wound base. The wound was  debrided to the level of the muscle tissue. Following debridement the wound measured 5x6.  Integra flowable 3cc was injected into the central aspect of the wound. Integra bilayer was stapled to the wound surface.  A wound VAC was applied, with a black sponge adhered to the wound bed, followed by occlusive dressing. The trackpad was placed and set to the wound VAC machine at 125 mmHg.   The foot was then dressed with 4x4, kerlix, and ACE bandage. Patient tolerated the procedure well.   Disposition: Following a period of post-operative monitoring, patient will be transferred back home.

## 2019-10-28 NOTE — Discharge Instructions (Signed)

## 2019-10-28 NOTE — Interval H&P Note (Signed)
History and Physical Interval Note:  10/28/2019 11:33 AM  Javier Hunt.  has presented today for surgery, with the diagnosis of DIABETIC ULCER RIGHT FOOT.  The various methods of treatment have been discussed with the patient and family. After consideration of risks, benefits and other options for treatment, the patient has consented to  Procedure(s): IRRIGATION AND DEBRIDEMENT WOUND RIGHT HEEL (Right) POSSIBLE GRAFT APPLICATION (Right) as a surgical intervention.  The patient's history has been reviewed, patient examined, no change in status, stable for surgery.  I have reviewed the patient's chart and labs.  Questions were answered to the patient's satisfaction.     Evelina Bucy

## 2019-10-28 NOTE — Transfer of Care (Signed)
  Last Vitals:  Vitals Value Taken Time  BP 112/69 10/28/19 1255  Temp    Pulse 62 10/28/19 1258  Resp 12 10/28/19 1258  SpO2 99 % 10/28/19 1258  Vitals shown include unvalidated device data.  Last Pain:  Vitals:   10/28/19 1125  TempSrc: Oral  PainSc: 0-No pain      Patients Stated Pain Goal: 5 (10/28/19 1125) Immediate Anesthesia Transfer of Care Note  Patient: Javier Hunt.  Procedure(s) Performed: Procedure(s) (LRB): IRRIGATION AND DEBRIDEMENT WOUND RIGHT HEEL (Right) GRAFT APPLICATION (Right)  Patient Location: PACU  Anesthesia Type:MAC  Level of Consciousness: awake, alert  and oriented  Airway & Oxygen Therapy: Patient Spontanous Breathing and Patient connected to face mask oxygen  Post-op Assessment: Report given to PACU RN and Post -op Vital signs reviewed and stable  Post vital signs: Reviewed and stable  Complications: No apparent anesthesia complications

## 2019-10-30 ENCOUNTER — Ambulatory Visit: Payer: BC Managed Care – PPO | Admitting: Podiatry

## 2019-10-30 ENCOUNTER — Other Ambulatory Visit: Payer: Self-pay

## 2019-10-30 ENCOUNTER — Ambulatory Visit (INDEPENDENT_AMBULATORY_CARE_PROVIDER_SITE_OTHER): Payer: BC Managed Care – PPO | Admitting: Podiatry

## 2019-10-30 ENCOUNTER — Encounter: Payer: BC Managed Care – PPO | Admitting: Podiatry

## 2019-10-30 DIAGNOSIS — E11621 Type 2 diabetes mellitus with foot ulcer: Secondary | ICD-10-CM

## 2019-10-30 DIAGNOSIS — L97413 Non-pressure chronic ulcer of right heel and midfoot with necrosis of muscle: Secondary | ICD-10-CM

## 2019-11-02 LAB — AEROBIC/ANAEROBIC CULTURE W GRAM STAIN (SURGICAL/DEEP WOUND): Gram Stain: NONE SEEN

## 2019-11-05 ENCOUNTER — Other Ambulatory Visit: Payer: Self-pay

## 2019-11-05 ENCOUNTER — Ambulatory Visit (INDEPENDENT_AMBULATORY_CARE_PROVIDER_SITE_OTHER): Payer: BC Managed Care – PPO | Admitting: Podiatry

## 2019-11-05 VITALS — Temp 97.2°F

## 2019-11-05 DIAGNOSIS — L97413 Non-pressure chronic ulcer of right heel and midfoot with necrosis of muscle: Secondary | ICD-10-CM

## 2019-11-05 DIAGNOSIS — E11621 Type 2 diabetes mellitus with foot ulcer: Secondary | ICD-10-CM | POA: Diagnosis not present

## 2019-11-05 MED ORDER — AMOXICILLIN-POT CLAVULANATE 875-125 MG PO TABS: 1.0000 | ORAL_TABLET | Freq: Two times a day (BID) | ORAL | 0 refills | Status: DC

## 2019-11-05 NOTE — Progress Notes (Signed)
  Subjective:  Patient ID: Javier Hunt., male    DOB: 1975/08/08,  MRN: [id number]  Chief Complaint  Patient presents with  . Routine Post Op    POV#3 DOS 5.14.2021 DEBRIDMENT AND IRRIGATION OF RIGHT HEEL W/ APPLICATION OF WOUND VAC. Pt states healing well, denies fever/chills/nausea/vomiting.   44 y.o. male presents for wound care f/u.   Objective:  Physical Exam: Wound Location: right heel Wound Measurement: UTA Wound Base: UTA (graft intact) Peri-wound: Normal Exudate: Scant/small amount Serosanguinous exudate Wound without warmth erythema, signs of infection today.     Assessment:   1. Diabetic ulcer of right heel associated with type 2 diabetes mellitus, with necrosis of muscle ([place])    Plan:  Patient was evaluated and treated and all questions answered.  Ulcer right foot -Wound improving. -Wound graft intact. Moistened with hydrogel. Adaptic and Wound VAC reapplied.   Procedure: Wound VAC Application Location: right heel Wound Measurement: 5 cm x 5 cm x 0.5 cm  Technique: Black foam to wound base, followed by adherent dressing. Set to 125 mmHg with good seal noted. Disposition: Patient tolerated procedure well.  No follow-ups on file.

## 2019-11-12 ENCOUNTER — Ambulatory Visit: Payer: BC Managed Care – PPO | Admitting: Podiatry

## 2019-11-13 ENCOUNTER — Other Ambulatory Visit: Payer: Self-pay

## 2019-11-13 ENCOUNTER — Ambulatory Visit (INDEPENDENT_AMBULATORY_CARE_PROVIDER_SITE_OTHER): Payer: BC Managed Care – PPO | Admitting: Podiatry

## 2019-11-13 ENCOUNTER — Encounter: Payer: Self-pay | Admitting: Podiatry

## 2019-11-13 DIAGNOSIS — E11621 Type 2 diabetes mellitus with foot ulcer: Secondary | ICD-10-CM

## 2019-11-13 DIAGNOSIS — L97413 Non-pressure chronic ulcer of right heel and midfoot with necrosis of muscle: Secondary | ICD-10-CM

## 2019-11-13 DIAGNOSIS — E1142 Type 2 diabetes mellitus with diabetic polyneuropathy: Secondary | ICD-10-CM

## 2019-11-13 NOTE — Progress Notes (Signed)
  Subjective:  Patient ID: Javier Hunt., male    DOB: 05/12/1976,  MRN: [id number]  Chief Complaint  Patient presents with  . Routine Post Op    POV#3 DOS 5.14.2021 DEBRIDEMENT & IRRIGATION OF RIGHT HEEL W/ APPLICATION OF WOUND VAC. Pt states "It is the same". Denies fever/chills/nausea/vomiting.   44 y.o. male presents for wound care f/u.   Objective:  Physical Exam: Wound Location: right heel Wound Measurement: UTA Wound Base: UTA (graft intact) Peri-wound: Normal Exudate: Scant/small amount Serosanguinous exudate Wound without warmth erythema, signs of infection today.     Assessment:   1. Diabetic ulcer of right heel associated with type 2 diabetes mellitus, with necrosis of muscle ([place])   2. DM type 2 with diabetic peripheral neuropathy ([place])    Plan:  Patient was evaluated and treated and all questions answered.  Ulcer right foot -Wound improving. -There appears to be maceration to the periwound.  Therefore I will give a VAC holiday over the weekend and will plan on reapplying the [facility] by home nursing on Mondays. -Patient will be scheduled to follow-up with Dr. March Rummage. -Betadine wet-to-dry dressing was applied.  - No follow-ups on file.

## 2019-11-16 NOTE — Progress Notes (Signed)
  Subjective:  Patient ID: Javier Hunt., male    DOB: February 03, 1976,  MRN: [id number]  Chief Complaint  Patient presents with  . Wound Check    R midfoot and heel. Pt stated, "Seems to be improving. Took wound vac off at 8:30am. No pain. Clear yellow drainage. There's an odor. No abnormal glucose/fever/chills/N&V/excessive swelling. No antibiotics. Using saline".    44 y.o. male presents for wound care. Hx confirmed with patient.  Objective:  Physical Exam: Wound Location: right heel Wound Measurement: 5.5x5 Wound Base: Mixed Granular/Fibrotic Peri-wound: Macerated Exudate: Moderate amount Serosanguinous exudate wound without warmth, erythema, signs of acute infection Assessment:   1. Diabetic ulcer of right heel associated with type 2 diabetes mellitus, with necrosis of muscle ([place])      Plan:  Patient was evaluated and treated and all questions answered.  Ulcer Right heel -Offload ulcer with CAM boot -Wound cleansed and debrided.  Procedure: Wound VAC Application Location: right heel Wound Measurement: 5.5 cm x 5 cm x 0.5 cm  Technique: Black foam to wound base, followed by adherent dressing. Set to 125 mmHg with good seal noted. Disposition: Patient tolerated procedure well.  Return in about 1 week (around 09/25/2019) for Wound care/Vac.

## 2019-11-19 ENCOUNTER — Ambulatory Visit: Payer: BC Managed Care – PPO | Admitting: Podiatry

## 2019-11-19 ENCOUNTER — Telehealth: Payer: Self-pay | Admitting: Podiatry

## 2019-11-19 NOTE — Telephone Encounter (Signed)
Pt is going out of town next week and wanted to confirm with the doctor that he will be okay to go a week without his wound vac. Pts home health nurse is going to change the wound vac tomorrow 11/20/19. Please give patient a call back.

## 2019-11-20 ENCOUNTER — Ambulatory Visit (INDEPENDENT_AMBULATORY_CARE_PROVIDER_SITE_OTHER): Payer: BC Managed Care – PPO | Admitting: Podiatry

## 2019-11-20 ENCOUNTER — Telehealth: Payer: Self-pay | Admitting: Podiatry

## 2019-11-20 ENCOUNTER — Other Ambulatory Visit: Payer: Self-pay

## 2019-11-20 VITALS — Temp 97.0°F

## 2019-11-20 DIAGNOSIS — E11621 Type 2 diabetes mellitus with foot ulcer: Secondary | ICD-10-CM | POA: Diagnosis not present

## 2019-11-20 DIAGNOSIS — E1142 Type 2 diabetes mellitus with diabetic polyneuropathy: Secondary | ICD-10-CM | POA: Diagnosis not present

## 2019-11-20 DIAGNOSIS — L97413 Non-pressure chronic ulcer of right heel and midfoot with necrosis of muscle: Secondary | ICD-10-CM | POA: Diagnosis not present

## 2019-11-20 NOTE — Telephone Encounter (Signed)
Christy from [facility] called and needed clincals 10/24/19 til now faxed tp [phone] they provided py with a negative pressure wound vac

## 2019-11-20 NOTE — Telephone Encounter (Signed)
Faxed requested clinicals to [facility].

## 2019-11-29 NOTE — Progress Notes (Signed)
  Subjective:  Patient ID: Javier Hunt., male    DOB: Mar 12, 1976,  MRN: [id number]  Chief Complaint  Patient presents with  . Diabetic Ulcer    R midfoot/heel. Pt stated, "It's okay. No pain/fever/chills/N&V/foul odor/abnormal glucose". Pt took a break from the wound vac as directed. Resumed wearing it on 11/16/19, and took it off again this morning. Pt is unaware of any unusual drainage.   44 y.o. male presents for wound care f/u. Patient is going out of town next week and will be unable to apply VAC.  Objective:  Physical Exam: Wound Location: right heel Wound Measurement: 4x4 Wound Base: granular, mild fibrosis Peri-wound: Normal Exudate: Scant/small amount Serosanguinous exudate Wound without warmth erythema, signs of infection today.     Assessment:   1. Diabetic ulcer of right heel associated with type 2 diabetes mellitus, with necrosis of muscle ([place])   2. DM type 2 with diabetic peripheral neuropathy ([place])    Plan:  Patient was evaluated and treated and all questions answered.  Ulcer right foot -Wound improving. -Continue dressing changes daily.  -Ok for small VAC break -Dressed with betadine WTD today. -F/u in 1 week.  - No follow-ups on file.

## 2019-12-03 NOTE — Progress Notes (Signed)
  Subjective:  Patient ID: Javier Hunt., male    DOB: Hunt 21, 1977,  MRN: [id number]  Chief Complaint  Patient presents with  . Routine Post Op    DOS 10/16/2019 and 10/28/2019. Pt stated, "Doing okay. The nurse is changing the dressings as directed". No fever/chills/N&V. Pt is unaware of abnormal drainage.   44 y.o. male presents for wound care f/u.   Objective:  Physical Exam: Wound Location: right heel Wound Measurement: granular Wound Base: 4.5x4.5 Peri-wound: macerated Exudate: Scant/small amount Serosanguinous exudate Wound without warmth erythema, signs of infection today.     Assessment:   1. Diabetic ulcer of right heel associated with type 2 diabetes mellitus, with necrosis of muscle ([place])    Plan:  Patient was evaluated and treated and all questions answered.  Ulcer right foot. -Wound graft removed. Wound macerated and malodor. Dressed with betadine WTD. Wound Vac break today. F/u in 1 week.    No follow-ups on file.

## 2019-12-04 ENCOUNTER — Ambulatory Visit (INDEPENDENT_AMBULATORY_CARE_PROVIDER_SITE_OTHER): Payer: BC Managed Care – PPO | Admitting: Podiatry

## 2019-12-04 ENCOUNTER — Other Ambulatory Visit: Payer: Self-pay

## 2019-12-04 DIAGNOSIS — L97413 Non-pressure chronic ulcer of right heel and midfoot with necrosis of muscle: Secondary | ICD-10-CM | POA: Diagnosis not present

## 2019-12-04 DIAGNOSIS — E11621 Type 2 diabetes mellitus with foot ulcer: Secondary | ICD-10-CM | POA: Diagnosis not present

## 2019-12-04 NOTE — Progress Notes (Signed)
  Subjective:  Patient ID: Javier Hunt., male    DOB: Sep 08, 1975,  MRN: [id number]  No chief complaint on file.  44 y.o. male presents for wound care f/u. Patient is having a little bit of tingling pain that lasts for 10 seconds and goes away. Odor is a little worse. Denies fevers, chills, N/V.  Objective:  Physical Exam: Wound Location: right heel Wound Measurement: 4.5x4.5 Wound Base: granular, mild fibrosis Peri-wound: Normal Exudate: Scant/small amount Serosanguinous exudate Wound without warmth erythema, signs of infection today.     Assessment:   1. Diabetic ulcer of right heel associated with type 2 diabetes mellitus, with necrosis of muscle ([place])    Plan:  Patient was evaluated and treated and all questions answered.  Ulcer right foot -Wound improving almost fully granular today -Selective debridement of fibrosis -Wound scrubbed, wound VAC break today  Procedure: Selective Debridement of Wound Rationale: Removal of devitalized tissue from the wound to promote healing.  Pre-Debridement Wound Measurements: 4.5 cm x 4.5 cm x 0.2 cm  Post-Debridement Wound Measurements: same as pre-debridement. Type of Debridement: sharp selective Tissue Removed: Devitalized soft-tissue Dressing: Dry, sterile, compression dressing. Disposition: Patient tolerated procedure well. Patient to return in 1 week for follow-up.    -F/u in 1 week for wound check, 2 weeks for new XRs and wound check  - No follow-ups on file.

## 2019-12-05 NOTE — Progress Notes (Signed)
  Subjective:  Patient ID: Javier Hunt., male    DOB: 1975/06/17,  MRN: [id number]  Chief Complaint  Patient presents with  . Wound Check    R heel wound check/WOUND VAC PLACEMENT; "itching up the leg; is there a cream for this?"    44 y.o. male presents for wound care. Hx confirmed with patient.  Objective:  Physical Exam: Wound Location: right heel Wound Measurement:  5x6 Wound Base: Granular/Healthy Peri-wound: Macerated Exudate: Moderate amount Serosanguinous exudate wound without warmth, erythema, signs of acute infection  Assessment:   1. Diabetic ulcer of right heel associated with type 2 diabetes mellitus, with necrosis of muscle ([place])      Plan:  Patient was evaluated and treated and all questions answered.  Ulcer Right heel -Wound cleansed and debrided -Continue NWB with Knee scooter  Procedure: Wound VAC Application Location: right heel Wound Measurement: 5 cm x 6 cm x 0.6 cm  Technique: Black foam to wound base, followed by adherent dressing. Set to 125 mmHg with good seal noted. Disposition: Patient tolerated procedure well.  No follow-ups on file.

## 2019-12-05 NOTE — Progress Notes (Signed)
  Subjective:  Patient ID: Javier Hunt., male    DOB: 1976/01/19,  MRN: [id number]  Chief Complaint  Patient presents with  . Wound Check    Pt denies fever/nausea/vomiting/chills. No new concerns.    44 y.o. male presents for wound care. Hx confirmed with patient.  Objective:  Physical Exam: Wound Location: right heel Wound Measurement:  5.5x5 Wound Base: Granular/Healthy Peri-wound: Macerated Exudate: Moderate amount Serosanguinous exudate wound without warmth, erythema, signs of acute infection  Assessment:   1. Diabetic ulcer of right heel associated with type 2 diabetes mellitus, with necrosis of muscle ([place])      Plan:  Patient was evaluated and treated and all questions answered.  Ulcer Right heel -Wound cleansed and debrided -Continue NWB with Knee scooter -Wound VAC reapplied  Procedure: Wound VAC Application Location: right heel Wound Measurement: 5.5 cm x 5 cm x 0.5 cm  Technique: Black foam to wound base, followed by adherent dressing. Set to 125 mmHg with good seal noted. Disposition: Patient tolerated procedure well.   No follow-ups on file.

## 2019-12-05 NOTE — Progress Notes (Signed)
  Subjective:  Patient ID: Javier Hunt., male    DOB: 08-31-1975,  MRN: [id number]  Chief Complaint  Patient presents with  . Diabetic Ulcer    Wound check - R heel. Pt stated, "No pain/fever/chills/N&V/abnormal drainage. It had a bad smell for a few days. Home health nurse has been changing the wound vac. I removed it at 8:30am".    44 y.o. male presents for wound care.  History as above Objective:  Physical Exam: Wound Location: right heel Wound Measurement: 5x4 Wound Base: Granular/Healthy Peri-wound: Normal Exudate: Scant/small amount Serosanguinous exudate wound without warmth, erythema, signs of acute infection    Assessment:   1. Diabetic ulcer of right heel associated with type 2 diabetes mellitus, with necrosis of muscle ([place])      Plan:  Patient was evaluated and treated and all questions answered.  Ulcer right foot -Offload ulcer with CAM boot -Wound cleansed and debrided  Procedure: Selective Debridement of Wound Rationale: Removal of devitalized tissue from the wound to promote healing.  Pre-Debridement Wound Measurements: 5x4x0.3 Post-Debridement Wound Measurements: same as pre-debridement. Type of Debridement: sharp selective Tissue Removed: Devitalized soft-tissue Dressing: Dry, sterile, compression dressing. Disposition: Patient tolerated procedure well. Patient to return in 1 week for follow-up.  Procedure: Wound VAC Application Location: right heel Wound Measurement: 5x4x0.3 Technique: Black foam to wound base, followed by adherent dressing. Set to 125 mmHg with good seal noted. Disposition: Patient tolerated procedure well.  Charcot Right Foot -Consolidation noted no prominent heel bone  No follow-ups on file.

## 2019-12-11 ENCOUNTER — Ambulatory Visit (INDEPENDENT_AMBULATORY_CARE_PROVIDER_SITE_OTHER): Payer: BC Managed Care – PPO | Admitting: Podiatry

## 2019-12-11 ENCOUNTER — Other Ambulatory Visit: Payer: Self-pay

## 2019-12-11 VITALS — Temp 96.6°F

## 2019-12-11 DIAGNOSIS — L97413 Non-pressure chronic ulcer of right heel and midfoot with necrosis of muscle: Secondary | ICD-10-CM

## 2019-12-11 DIAGNOSIS — E11621 Type 2 diabetes mellitus with foot ulcer: Secondary | ICD-10-CM

## 2019-12-12 NOTE — Progress Notes (Signed)
  Subjective:  Patient ID: Javier Hunt., male    DOB: Jan 05, 1976,  MRN: [id number]  Chief Complaint  Patient presents with  . Diabetic Ulcer    R heel. Tingling pain in bottom of foot resolved. No fever/chills/N&V. Pt reports that the foul odor improved, then returned in the past 24 hours. Removed wound vac today.   44 y.o. male presents for wound care f/u.  Overall resolved pain improving. Denies fevers, chills, N/V.  Objective:  Physical Exam: Wound Location: right heel Wound Measurement: 4x4 Wound Base: granular, mild fibrosis Peri-wound: Normal Exudate: Scant/small amount Serosanguinous exudate Wound without warmth erythema, signs of infection today.     Assessment:   1. Diabetic ulcer of right heel associated with type 2 diabetes mellitus, with necrosis of muscle ([place])    Plan:  Patient was evaluated and treated and all questions answered.  Ulcer right foot -Wound continues to improve and almost fully granular -Wound VAC reapplied  Procedure: Wound VAC Application Location: right heel Wound Measurement: 4 cm x 4 cm x 0.3 cm  Technique: Black foam to wound base, followed by adherent dressing. Set to 125 mmHg with good seal noted. Disposition: Patient tolerated procedure well.     -F/u in 1 week for wound check with new x-rays  - No follow-ups on file.

## 2019-12-18 ENCOUNTER — Ambulatory Visit (INDEPENDENT_AMBULATORY_CARE_PROVIDER_SITE_OTHER): Payer: BC Managed Care – PPO | Admitting: Podiatry

## 2019-12-18 ENCOUNTER — Ambulatory Visit (INDEPENDENT_AMBULATORY_CARE_PROVIDER_SITE_OTHER): Payer: BC Managed Care – PPO

## 2019-12-18 ENCOUNTER — Other Ambulatory Visit: Payer: Self-pay

## 2019-12-18 DIAGNOSIS — E11621 Type 2 diabetes mellitus with foot ulcer: Secondary | ICD-10-CM

## 2019-12-18 DIAGNOSIS — L97413 Non-pressure chronic ulcer of right heel and midfoot with necrosis of muscle: Secondary | ICD-10-CM

## 2019-12-18 DIAGNOSIS — M14671 Charcot's joint, right ankle and foot: Secondary | ICD-10-CM

## 2019-12-18 NOTE — Progress Notes (Signed)
  Subjective:  Patient ID: Javier Hunt., male    DOB: 25-Sep-1975,  MRN: [id number]  Chief Complaint  Patient presents with  . Routine Post Op    POV #6 DOS 10/16/19 DEBRIDMENT & IRRIGATION OF RIGHT HEEL W/APPLICATION OF WOUND VAC   44 y.o. male presents for wound care f/u. Complains of odor to the wound. Denies other issues to the wound. Denies fevers, chills, N/V.  Objective:  Physical Exam: Wound Location: right heel Wound Measurement: 4x4 Wound Base: granular, mild fibrosis centrally. Peri-wound: Normal Exudate: Scant/small amount Serosanguinous exudate Wound without warmth erythema, signs of infection today. Mild malodor     Assessment:   1. Diabetic ulcer of right heel associated with type 2 diabetes mellitus, with necrosis of muscle ([place])   2. Charcot ankle, right    Plan:  Patient was evaluated and treated and all questions answered.  Ulcer right foot -Wound continues to improve and almost fully granular -Wound VAC break today given odor  Charcot foot -XR reviewed. Increased consolidation. Better alignment noted. Discussed possible plantar calcaneal erosion consistent with osteomyelitis vs post-surgical or prior biopsy changes. Will monitor clinically given no overt signs of infection. Consider MRI in future. Will need to rule out MRI prior to surgical intervention if reconstruction indicated.  No follow-ups on file.

## 2019-12-25 ENCOUNTER — Encounter: Payer: Self-pay | Admitting: Podiatry

## 2019-12-25 ENCOUNTER — Ambulatory Visit (INDEPENDENT_AMBULATORY_CARE_PROVIDER_SITE_OTHER): Payer: BC Managed Care – PPO | Admitting: Podiatry

## 2019-12-25 ENCOUNTER — Other Ambulatory Visit: Payer: Self-pay

## 2019-12-25 DIAGNOSIS — E11621 Type 2 diabetes mellitus with foot ulcer: Secondary | ICD-10-CM

## 2019-12-25 DIAGNOSIS — L97413 Non-pressure chronic ulcer of right heel and midfoot with necrosis of muscle: Secondary | ICD-10-CM | POA: Diagnosis not present

## 2019-12-25 NOTE — Progress Notes (Signed)
  Subjective:  Patient ID: Javier Hunt., male    DOB: [birth year],  MRN: [id number]  Chief Complaint  Patient presents with  . Foot Problem    the right heel looks a little better and i have the wound vac supplies with me today   44 y.o. male presents for wound care f/u.  Thinks the wound is overall doing well denies new complaints  Objective:  Physical Exam: Wound Location: right heel Wound Measurement: 4x4 Wound Base: Mostly granular  peri-wound: Normal Exudate: Scant/small amount Serosanguinous exudate Wound without warmth erythema, signs of infection today. No malodor noted today    Assessment:   1. Diabetic ulcer of right heel associated with type 2 diabetes mellitus, with necrosis of muscle ([place])    Plan:  Patient was evaluated and treated and all questions answered.  Ulcer right foot -Wound minimally debrided and wound VAC reapplied as below  Procedure: Wound VAC Application Location: right heel Wound Measurement: 4 cm x 4 cm x 0.3 cm  Technique: Black foam to wound base, followed by adherent dressing. Set to 125 mmHg with good seal noted. Disposition: Patient tolerated procedure well.   Charcot foot -Stable no warmth erythema or edema today  No follow-ups on file.

## 2020-01-01 ENCOUNTER — Other Ambulatory Visit: Payer: Self-pay

## 2020-01-01 ENCOUNTER — Ambulatory Visit (INDEPENDENT_AMBULATORY_CARE_PROVIDER_SITE_OTHER): Payer: BC Managed Care – PPO | Admitting: Podiatry

## 2020-01-01 DIAGNOSIS — L97413 Non-pressure chronic ulcer of right heel and midfoot with necrosis of muscle: Secondary | ICD-10-CM | POA: Diagnosis not present

## 2020-01-01 DIAGNOSIS — E11621 Type 2 diabetes mellitus with foot ulcer: Secondary | ICD-10-CM | POA: Diagnosis not present

## 2020-01-01 NOTE — Progress Notes (Signed)
  Subjective:  Patient ID: [name]., male    DOB: 22-Apr-1976,  MRN: [id number]  Chief Complaint  Patient presents with  . Routine Post Op    DOS 5.14.2021 Debridement and irrigation of right heel with application of wound vac. Pt denies fever/chills/nausea/vomiting and states he has no concerns.   44 y.o. male presents for wound care f/u. Denies new issues with the wound.  Objective:  Physical Exam: Wound Location: right heel Wound Measurement: 4x4 Wound Base: Mostly granular  peri-wound: Normal Exudate: Scant/small amount Serosanguinous exudate Wound without warmth erythema, signs of infection today. Mild malodor noted today    Assessment:   1. Diabetic ulcer of right heel associated with type 2 diabetes mellitus, with necrosis of muscle ([place])    Plan:  Patient was evaluated and treated and all questions answered.  Ulcer right foot -Wound minimally debrided and VAC reapplied. -Since the wound does not need much debridement will have patient come back in 2 weeks for repeat wound VAC application  Procedure: Wound VAC Application Location: right heel Wound Measurement: 4 cm x 4 cm x 0.3 cm  Technique: Black foam to wound base, followed by adherent dressing. Set to 125 mmHg with good seal noted. Disposition: Patient tolerated procedure well.   Charcot foot -Stable no warmth erythema or edema today  No follow-ups on file.

## 2020-01-19 ENCOUNTER — Other Ambulatory Visit: Payer: Self-pay

## 2020-01-19 ENCOUNTER — Ambulatory Visit (INDEPENDENT_AMBULATORY_CARE_PROVIDER_SITE_OTHER): Payer: BC Managed Care – PPO | Admitting: Podiatry

## 2020-01-19 DIAGNOSIS — L97413 Non-pressure chronic ulcer of right heel and midfoot with necrosis of muscle: Secondary | ICD-10-CM

## 2020-01-19 DIAGNOSIS — M14671 Charcot's joint, right ankle and foot: Secondary | ICD-10-CM

## 2020-01-19 DIAGNOSIS — E11621 Type 2 diabetes mellitus with foot ulcer: Secondary | ICD-10-CM | POA: Diagnosis not present

## 2020-01-19 MED ORDER — REGRANEX 0.01 % EX GEL
1.0000 | Freq: Every day | CUTANEOUS | 0 refills | Status: DC
Start: 2020-01-19 — End: 2020-10-16

## 2020-01-19 NOTE — Progress Notes (Signed)
  Subjective:  Patient ID: Javier Hunt., male    DOB: [birth year],  MRN: [id number]  Chief Complaint  Patient presents with  . Wound Check    R heel. Pt stated, "Doing well. The wound vac was removed yesterday". No fever/chills/N&V/pus/foul odor.   44 y.o. male presents for wound care f/u. Denies new issues with the wound.  Objective:  Physical Exam: Wound Location: right heel Wound Measurement: 3x3 Wound Base: Fully granular  peri-wound: Normal Exudate: Scant/small amount Serosanguinous exudate Wound without warmth erythema, signs of infection today. Mild malodor noted today    Assessment:   1. Diabetic ulcer of right heel associated with type 2 diabetes mellitus, with necrosis of muscle (Javier Hunt)   2. Charcot ankle, right    Plan:  Patient was evaluated and treated and all questions answered.  Ulcer right foot -Much improved. -Ok to d/c VAC. -Switch to Regranex -Dressed with Prisma/DSD today. Once Regranex arrives patient's wife states she is comfortable doing dressing changes.   Charcot foot -Stable no warmth erythema or edema today  No follow-ups on file.

## 2020-01-21 ENCOUNTER — Telehealth: Payer: Self-pay | Admitting: Podiatry

## 2020-01-21 NOTE — Telephone Encounter (Signed)
Called provided additional info and approved

## 2020-01-21 NOTE — Telephone Encounter (Signed)
Apolonio Schneiders from [facility] called stating that pt was tring to order diabetic supplies they wanted to know if Dr.Price is the Dr that's siging the order for pt please call [phone] acct# [phone]

## 2020-01-25 ENCOUNTER — Telehealth: Payer: Self-pay

## 2020-01-25 NOTE — Telephone Encounter (Signed)
Nurse from home health called to see if wound care orders for pt will decrease frequent from 1 a wk for 2 wks and then possible discharge on 2 wk. Please advise.

## 2020-01-25 NOTE — Telephone Encounter (Signed)
Orders approved.

## 2020-01-27 NOTE — Telephone Encounter (Signed)
Javier Hunt was updated with this information. Thank you

## 2020-01-29 ENCOUNTER — Telehealth: Payer: Self-pay | Admitting: Podiatry

## 2020-01-29 DIAGNOSIS — L97413 Non-pressure chronic ulcer of right heel and midfoot with necrosis of muscle: Secondary | ICD-10-CM

## 2020-01-29 DIAGNOSIS — E11621 Type 2 diabetes mellitus with foot ulcer: Secondary | ICD-10-CM

## 2020-01-29 NOTE — Telephone Encounter (Signed)
Per Pt: I need orders to end [facility], If Im suppose to end it. "Also, you were suppose to send some type of chemical over to the Pharmacy and I never received it."

## 2020-01-29 NOTE — Addendum Note (Signed)
Addended by: Hardie Pulley on: 01/29/2020 02:58 PM   Modules accepted: Orders

## 2020-02-12 ENCOUNTER — Ambulatory Visit (INDEPENDENT_AMBULATORY_CARE_PROVIDER_SITE_OTHER): Payer: BC Managed Care – PPO | Admitting: Podiatry

## 2020-02-12 ENCOUNTER — Encounter: Payer: BC Managed Care – PPO | Admitting: Podiatry

## 2020-02-12 ENCOUNTER — Other Ambulatory Visit: Payer: Self-pay

## 2020-02-12 DIAGNOSIS — M14671 Charcot's joint, right ankle and foot: Secondary | ICD-10-CM

## 2020-02-12 DIAGNOSIS — L97413 Non-pressure chronic ulcer of right heel and midfoot with necrosis of muscle: Secondary | ICD-10-CM

## 2020-02-12 DIAGNOSIS — E11621 Type 2 diabetes mellitus with foot ulcer: Secondary | ICD-10-CM | POA: Diagnosis not present

## 2020-02-18 ENCOUNTER — Telehealth: Payer: Self-pay | Admitting: Podiatry

## 2020-02-18 NOTE — Telephone Encounter (Signed)
Good Morning, I am waiting on notes for 02/12/20 visit. [employer]

## 2020-02-22 NOTE — Progress Notes (Signed)
  Subjective:  Patient ID: [name]., male    DOB: 08-20-1975,  MRN: [id number]  Chief Complaint  Patient presents with  . Routine Post Op     3wk POV  DOS 10/16/19 DEBRIDMENT & IRRIGATION OF RIGHT HEEL W/APPLICATION OF WOUND VAC   44 y.o. male presents for wound care f/u. Denies new issues with the wound.  Objective:  Physical Exam: Wound Location: right heel Wound Measurement: 3x2.5 Wound Base: Fully granular  peri-wound: Normal Exudate: Scant/small amount Serosanguinous exudate Wound without warmth erythema, signs of infection today.    Assessment:   1. Diabetic ulcer of right heel associated with type 2 diabetes mellitus, with necrosis of muscle ([place])   2. Charcot ankle, right    Plan:  Patient was evaluated and treated and all questions answered.  Ulcer right foot -Continues to improve -Gently mechanically debrided. -Dressed with DSD. -Dispense heel offloading shoes.  Charcot foot -Stable no warmth erythema or edema today -Will still need to be out of work until wound fully heals.  No follow-ups on file.

## 2020-02-23 NOTE — Telephone Encounter (Signed)
Complete

## 2020-02-26 ENCOUNTER — Other Ambulatory Visit: Payer: Self-pay

## 2020-02-26 ENCOUNTER — Ambulatory Visit (INDEPENDENT_AMBULATORY_CARE_PROVIDER_SITE_OTHER): Payer: BC Managed Care – PPO | Admitting: Podiatry

## 2020-02-26 ENCOUNTER — Ambulatory Visit (INDEPENDENT_AMBULATORY_CARE_PROVIDER_SITE_OTHER): Payer: BC Managed Care – PPO

## 2020-02-26 DIAGNOSIS — E11621 Type 2 diabetes mellitus with foot ulcer: Secondary | ICD-10-CM

## 2020-02-26 DIAGNOSIS — L97413 Non-pressure chronic ulcer of right heel and midfoot with necrosis of muscle: Secondary | ICD-10-CM

## 2020-02-26 DIAGNOSIS — E1142 Type 2 diabetes mellitus with diabetic polyneuropathy: Secondary | ICD-10-CM | POA: Diagnosis not present

## 2020-02-26 DIAGNOSIS — M14671 Charcot's joint, right ankle and foot: Secondary | ICD-10-CM | POA: Diagnosis not present

## 2020-03-02 NOTE — Progress Notes (Signed)
  Subjective:  Patient ID: Javier Hunt., male    DOB: 1975-09-02,  MRN: [id number]  Chief Complaint  Patient presents with  . Wound Check    right heel diabetic ulcer. edema noted to right foot and lower ext.    44 y.o. male presents for wound care f/u. Denies new issues with the wound.  Complains of swelling to the right foot.  Denies nausea vomiting fevers and chills   Objective:  Physical Exam: Wound Location: right heel Wound Measurement: 4x3.5 cm Wound Base: Fully granular  peri-wound: Normal Exudate: Scant/small amount Serosanguinous exudate Wound without warmth erythema, signs of infection today.    Assessment:   1. Diabetic ulcer of right heel associated with type 2 diabetes mellitus, with necrosis of muscle ([facility])   2. Charcot ankle, right   3. DM type 2 with diabetic peripheral neuropathy ([place])    Plan:  Patient was evaluated and treated and all questions answered.  Ulcer right foot Slightly worsened today likely due to weightbearing.  Unfortunately I discussed that we will need to resume nonweightbearing to promote healing of the wound until we can consider reconstructive surgery Charcot foot -Repeat x-rays show stable Charcot changes without acute worsening.  No signs of acute infection noted -Discussed that due to the decreased progress of the wound he will need to resume nonweightbearing until the wound heals  No follow-ups on file.

## 2020-03-29 ENCOUNTER — Other Ambulatory Visit: Payer: Self-pay

## 2020-03-29 ENCOUNTER — Ambulatory Visit (INDEPENDENT_AMBULATORY_CARE_PROVIDER_SITE_OTHER): Payer: BC Managed Care – PPO | Admitting: Podiatry

## 2020-03-29 DIAGNOSIS — E1142 Type 2 diabetes mellitus with diabetic polyneuropathy: Secondary | ICD-10-CM | POA: Diagnosis not present

## 2020-03-29 DIAGNOSIS — E11621 Type 2 diabetes mellitus with foot ulcer: Secondary | ICD-10-CM

## 2020-03-29 DIAGNOSIS — L97413 Non-pressure chronic ulcer of right heel and midfoot with necrosis of muscle: Secondary | ICD-10-CM

## 2020-03-29 DIAGNOSIS — M14671 Charcot's joint, right ankle and foot: Secondary | ICD-10-CM

## 2020-03-29 MED ORDER — SILVER SULFADIAZINE 1 % EX CREA
TOPICAL_CREAM | CUTANEOUS | 0 refills | Status: DC
Start: 2020-03-29 — End: 2020-10-16

## 2020-04-04 NOTE — Progress Notes (Signed)
  Subjective:  Patient ID: Javier Hunt., male    DOB: [birth year],  MRN: [id number]  Chief Complaint  Patient presents with  . Routine Post Op    Pt states continued healing, denies fever/nausea/vomiting/chills. Pt states he has been denied long term disability.   44 y.o. male presents for wound care f/u.  States the wound is doing better   Objective:  Physical Exam: Wound Location: right heel Wound Measurement: 1 x 0.5 Wound Base: Fully granular  peri-wound: Normal Exudate: Scant/small amount Serosanguinous exudate Wound without warmth erythema, signs of infection today.    Assessment:   1. Diabetic ulcer of right heel associated with type 2 diabetes mellitus, with necrosis of muscle ([facility])   2. Charcot ankle, right   3. Charcot's joint of right foot   4. DM type 2 with diabetic peripheral neuropathy ([place])    Plan:  Patient was evaluated and treated and all questions answered.  Ulcer right foot -Again improving.  Once the wound is fully healed we can talk about reconstructive options with patient.  Dressed with Prisma and Mepilex border dressing today.  Patient to continue to apply Prisma daily.  Return in about 1 month (around 04/29/2020) for Wound Care.

## 2020-04-21 ENCOUNTER — Other Ambulatory Visit: Payer: Self-pay

## 2020-04-21 ENCOUNTER — Encounter (INDEPENDENT_AMBULATORY_CARE_PROVIDER_SITE_OTHER): Payer: BC Managed Care – PPO | Admitting: Ophthalmology

## 2020-04-21 DIAGNOSIS — H35371 Puckering of macula, right eye: Secondary | ICD-10-CM

## 2020-04-21 DIAGNOSIS — E103511 Type 1 diabetes mellitus with proliferative diabetic retinopathy with macular edema, right eye: Secondary | ICD-10-CM

## 2020-04-21 DIAGNOSIS — H43813 Vitreous degeneration, bilateral: Secondary | ICD-10-CM

## 2020-04-21 DIAGNOSIS — I10 Essential (primary) hypertension: Secondary | ICD-10-CM | POA: Diagnosis not present

## 2020-04-21 DIAGNOSIS — E10311 Type 1 diabetes mellitus with unspecified diabetic retinopathy with macular edema: Secondary | ICD-10-CM

## 2020-04-21 DIAGNOSIS — H35033 Hypertensive retinopathy, bilateral: Secondary | ICD-10-CM

## 2020-04-21 DIAGNOSIS — E103592 Type 1 diabetes mellitus with proliferative diabetic retinopathy without macular edema, left eye: Secondary | ICD-10-CM | POA: Diagnosis not present

## 2020-05-03 ENCOUNTER — Ambulatory Visit (INDEPENDENT_AMBULATORY_CARE_PROVIDER_SITE_OTHER): Payer: PRIVATE HEALTH INSURANCE | Admitting: Podiatry

## 2020-05-03 ENCOUNTER — Other Ambulatory Visit: Payer: Self-pay

## 2020-05-03 DIAGNOSIS — E11621 Type 2 diabetes mellitus with foot ulcer: Secondary | ICD-10-CM

## 2020-05-03 DIAGNOSIS — L97411 Non-pressure chronic ulcer of right heel and midfoot limited to breakdown of skin: Secondary | ICD-10-CM | POA: Diagnosis not present

## 2020-05-03 DIAGNOSIS — M14671 Charcot's joint, right ankle and foot: Secondary | ICD-10-CM | POA: Diagnosis not present

## 2020-05-03 NOTE — Progress Notes (Signed)
  Subjective:  Patient ID: Javier Hunt., male    DOB: Apr 02, 1976,  MRN: [id number]  Chief Complaint  Patient presents with  . Wound Check    Pt stated that he is doing okay he denies any Nausea/vomitting/fever or chills. He has no concerns at this time    44 y.o. male presents for wound care f/u.  States the wound is doing better. Was denied for long-term disability.  Objective:  Physical Exam: Wound Location: right heel Wound Measurement: 0.5x0.5 Wound Base: Fully granular  peri-wound: Normal Exudate: Scant/small amount Serosanguinous exudate Wound without warmth erythema, signs of infection today.    Assessment:   1. Diabetic ulcer of right heel associated with type 2 diabetes mellitus, limited to breakdown of skin ([facility])   2. Charcot ankle, right   3. Charcot's joint of right foot    Plan:  Patient was evaluated and treated and all questions answered.  Ulcer right foot -Again improving.  Once the wound is fully healed we can talk about reconstructive options with patient.  -Dressed with Silvadene and Mepilex border dressing today.  Patient to continue Prisma and dressing at home -We discussed likelihood of reconstructive surgery.  I do think he would likely need this.  We did discuss the possibility of just boot immobilization.  Once fully healed well plan for surgery. -New x-rays next visit to monitor Charcot  No follow-ups on file.

## 2020-05-06 ENCOUNTER — Encounter (INDEPENDENT_AMBULATORY_CARE_PROVIDER_SITE_OTHER): Payer: BC Managed Care – PPO | Admitting: Ophthalmology

## 2020-05-31 ENCOUNTER — Ambulatory Visit (INDEPENDENT_AMBULATORY_CARE_PROVIDER_SITE_OTHER): Payer: 59 | Admitting: Podiatry

## 2020-05-31 ENCOUNTER — Other Ambulatory Visit: Payer: Self-pay

## 2020-05-31 DIAGNOSIS — E11621 Type 2 diabetes mellitus with foot ulcer: Secondary | ICD-10-CM

## 2020-05-31 DIAGNOSIS — E611 Iron deficiency: Secondary | ICD-10-CM | POA: Insufficient documentation

## 2020-05-31 DIAGNOSIS — N529 Male erectile dysfunction, unspecified: Secondary | ICD-10-CM | POA: Insufficient documentation

## 2020-05-31 DIAGNOSIS — E1140 Type 2 diabetes mellitus with diabetic neuropathy, unspecified: Secondary | ICD-10-CM | POA: Insufficient documentation

## 2020-05-31 DIAGNOSIS — E1065 Type 1 diabetes mellitus with hyperglycemia: Secondary | ICD-10-CM | POA: Insufficient documentation

## 2020-05-31 DIAGNOSIS — R609 Edema, unspecified: Secondary | ICD-10-CM | POA: Insufficient documentation

## 2020-05-31 DIAGNOSIS — Z9641 Presence of insulin pump (external) (internal): Secondary | ICD-10-CM | POA: Insufficient documentation

## 2020-05-31 DIAGNOSIS — E785 Hyperlipidemia, unspecified: Secondary | ICD-10-CM | POA: Insufficient documentation

## 2020-05-31 DIAGNOSIS — L97411 Non-pressure chronic ulcer of right heel and midfoot limited to breakdown of skin: Secondary | ICD-10-CM

## 2020-05-31 DIAGNOSIS — M14671 Charcot's joint, right ankle and foot: Secondary | ICD-10-CM | POA: Diagnosis not present

## 2020-05-31 DIAGNOSIS — E7800 Pure hypercholesterolemia, unspecified: Secondary | ICD-10-CM | POA: Insufficient documentation

## 2020-05-31 DIAGNOSIS — E559 Vitamin D deficiency, unspecified: Secondary | ICD-10-CM | POA: Insufficient documentation

## 2020-05-31 DIAGNOSIS — I10 Essential (primary) hypertension: Secondary | ICD-10-CM | POA: Insufficient documentation

## 2020-05-31 NOTE — Progress Notes (Signed)
  Subjective:  Patient ID: Javier Hunt., male    DOB: [birth year],  MRN: [id number]  Chief Complaint  Patient presents with  . Wound Check    Pt states no new concerns.    44 y.o. male presents for wound care f/u.  Denies new concerns has been dressing the wound alternatingly with collagen dressing and Silvadene.  Objective:  Physical Exam: Wound Location: right heel Wound Measurement: 0.5x0.5  Wound Base: Fully granular  peri-wound: Normal Exudate: Scant/small amount Serosanguinous exudate Wound without warmth erythema, signs of infection today.    Assessment:   1. Diabetic ulcer of right heel associated with type 2 diabetes mellitus, limited to breakdown of skin (HCC)   2. Charcot ankle, right   3. Charcot's joint of right foot    Plan:  Patient was evaluated and treated and all questions answered.  Ulcer right foot -Wound similar in size last visit but slightly improved.  His foot is warm which is concerning for continued Charcot process rather than infection.  Once he is healed we will discuss surgical intervention.  Plan for x-rays of the time.  Continue nonweightbearing  No follow-ups on file.

## 2020-06-21 ENCOUNTER — Encounter: Payer: 59 | Admitting: Podiatry

## 2020-06-21 ENCOUNTER — Other Ambulatory Visit: Payer: Self-pay

## 2020-06-21 ENCOUNTER — Ambulatory Visit (INDEPENDENT_AMBULATORY_CARE_PROVIDER_SITE_OTHER): Payer: 59

## 2020-06-21 ENCOUNTER — Ambulatory Visit (INDEPENDENT_AMBULATORY_CARE_PROVIDER_SITE_OTHER): Payer: 59 | Admitting: Podiatry

## 2020-06-21 DIAGNOSIS — E1142 Type 2 diabetes mellitus with diabetic polyneuropathy: Secondary | ICD-10-CM

## 2020-06-21 DIAGNOSIS — E11621 Type 2 diabetes mellitus with foot ulcer: Secondary | ICD-10-CM

## 2020-06-21 DIAGNOSIS — L97411 Non-pressure chronic ulcer of right heel and midfoot limited to breakdown of skin: Secondary | ICD-10-CM | POA: Diagnosis not present

## 2020-06-21 DIAGNOSIS — M14671 Charcot's joint, right ankle and foot: Secondary | ICD-10-CM

## 2020-06-21 NOTE — Progress Notes (Signed)
  Subjective:  Patient ID: Javier Hunt., male    DOB: 1976-05-01,  MRN: [id number]  Chief Complaint  Patient presents with  . Diabetic Ulcer    Bottom right foot wound check. Pt. States imrpovement. Xrays completed.    45 y.o. male presents for wound care f/u.  Denies new issues with the wound still states that it is present has been using Silvadene on the wound. Objective:  Physical Exam: Wound Location: right heel Wound Measurement: 1x1.5 Wound Base: Fully granular  peri-wound: Normal Exudate: Scant/small amount Serosanguinous exudate Wound without warmth erythema, signs of infection today.    Assessment:   1. Charcot's joint of right foot   2. Diabetic ulcer of right heel associated with type 2 diabetes mellitus, limited to breakdown of skin ([place])   3. DM type 2 with diabetic peripheral neuropathy ([place])    Plan:  Patient was evaluated and treated and all questions answered.  Ulcer right foot -Wound continues to be hard to heal to completion.  We will look into getting an advanced skin substitute to help promote healing. -New x-rays taken today.  He does not have worsening Charcot changes, no worsening signs of osteomyelitis, increased consolidation noted of the bones.  Order CT for further eval prior to discussion of surgical intervention.  Will review with patient next visit.  Return in about 3 weeks (around 07/12/2020) for wound care, surgical planning visit, possibl graft application.

## 2020-06-30 ENCOUNTER — Other Ambulatory Visit: Payer: BC Managed Care – PPO

## 2020-07-12 ENCOUNTER — Encounter: Payer: 59 | Admitting: Podiatry

## 2020-07-13 ENCOUNTER — Telehealth: Payer: Self-pay | Admitting: Podiatry

## 2020-07-13 ENCOUNTER — Other Ambulatory Visit: Payer: Self-pay | Admitting: Podiatry

## 2020-07-13 DIAGNOSIS — M14671 Charcot's joint, right ankle and foot: Secondary | ICD-10-CM

## 2020-07-13 DIAGNOSIS — M868X7 Other osteomyelitis, ankle and foot: Secondary | ICD-10-CM

## 2020-07-13 NOTE — Progress Notes (Signed)
New  CT order placed

## 2020-07-13 NOTE — Telephone Encounter (Signed)
[facility] has requested new order for the following patient. Stated the order needs to read WO contrast for the ankle. Please Advise

## 2020-07-15 NOTE — Telephone Encounter (Signed)
Order was taken care of

## 2020-07-20 ENCOUNTER — Telehealth: Payer: Self-pay | Admitting: Podiatry

## 2020-07-20 NOTE — Telephone Encounter (Signed)
[facility] has requested new order for the following patient, states it needs to read CT Ankle right WO Contrast, Please Advise

## 2020-07-20 NOTE — Telephone Encounter (Signed)
So there are 2 orders - one for with and one for without. Originally I ordered without but they said it had to be with. Now I'm not sure what is going on. Can we call them and check?

## 2020-07-21 DIAGNOSIS — M79676 Pain in unspecified toe(s): Secondary | ICD-10-CM

## 2020-07-22 ENCOUNTER — Encounter: Payer: Self-pay | Admitting: Podiatry

## 2020-07-25 ENCOUNTER — Telehealth: Payer: Self-pay | Admitting: Podiatry

## 2020-07-25 NOTE — Telephone Encounter (Signed)
[facility] did not receive the letter for medical necessity for this patient. They only received the cover sheet. This is holding up the patient care  Please fax [phone]

## 2020-07-25 NOTE — Telephone Encounter (Signed)
Spoke to [employer] we will send over again

## 2020-07-26 NOTE — Telephone Encounter (Signed)
Ok thank you 

## 2020-08-05 ENCOUNTER — Other Ambulatory Visit: Payer: Self-pay

## 2020-08-05 ENCOUNTER — Ambulatory Visit (INDEPENDENT_AMBULATORY_CARE_PROVIDER_SITE_OTHER): Payer: 59 | Admitting: Podiatry

## 2020-08-05 DIAGNOSIS — M868X7 Other osteomyelitis, ankle and foot: Secondary | ICD-10-CM

## 2020-08-05 DIAGNOSIS — E11621 Type 2 diabetes mellitus with foot ulcer: Secondary | ICD-10-CM | POA: Diagnosis not present

## 2020-08-05 DIAGNOSIS — M14671 Charcot's joint, right ankle and foot: Secondary | ICD-10-CM

## 2020-08-05 DIAGNOSIS — L97411 Non-pressure chronic ulcer of right heel and midfoot limited to breakdown of skin: Secondary | ICD-10-CM

## 2020-08-24 ENCOUNTER — Ambulatory Visit
Admission: RE | Admit: 2020-08-24 | Discharge: 2020-08-24 | Disposition: A | Discharge status: Home / Self Care | Payer: 59 | Source: Ambulatory Visit | Attending: Podiatry | Admitting: Podiatry

## 2020-08-24 ENCOUNTER — Other Ambulatory Visit: Payer: Self-pay | Admitting: Podiatry

## 2020-08-24 DIAGNOSIS — M868X7 Other osteomyelitis, ankle and foot: Secondary | ICD-10-CM

## 2020-08-24 MED ORDER — IOPAMIDOL (ISOVUE-300) INJECTION 61%
100.0000 mL | Freq: Once | INTRAVENOUS | Status: AC | PRN
  Administered 2020-08-24: 100 mL via INTRAVENOUS

## 2020-08-26 ENCOUNTER — Other Ambulatory Visit: Payer: Self-pay

## 2020-08-26 ENCOUNTER — Ambulatory Visit (INDEPENDENT_AMBULATORY_CARE_PROVIDER_SITE_OTHER): Payer: 59 | Admitting: Podiatry

## 2020-08-26 DIAGNOSIS — M14671 Charcot's joint, right ankle and foot: Secondary | ICD-10-CM | POA: Diagnosis not present

## 2020-08-26 DIAGNOSIS — L97411 Non-pressure chronic ulcer of right heel and midfoot limited to breakdown of skin: Secondary | ICD-10-CM

## 2020-08-26 DIAGNOSIS — E11621 Type 2 diabetes mellitus with foot ulcer: Secondary | ICD-10-CM

## 2020-08-26 DIAGNOSIS — M868X7 Other osteomyelitis, ankle and foot: Secondary | ICD-10-CM | POA: Diagnosis not present

## 2020-09-01 NOTE — Progress Notes (Signed)
  Subjective:  Patient ID: Javier Hunt., male    DOB: [birth year],  MRN: [id number]  Chief Complaint  Patient presents with  . Wound Check    -pt denies N/V??FCH - pt states," looks better." -pt denies pain/redness -w/ swelling and draiange -no odor Tx: abx cream and abande  . Diabetes    FBS: 81 a1C: 6.6   45 y.o. male presents for wound care f/u.  Did not get the CT scan as ordered. Objective:  Physical Exam: Wound Location: right heel Wound Measurement: 1x1.5 Wound Base: Fully granular  peri-wound: Normal Exudate: Scant/small amount Serosanguinous exudate Wound without warmth erythema, signs of infection today.    Assessment:   1. Other osteomyelitis of right foot ([place])   2. Charcot's joint of right foot   3. Diabetic ulcer of right heel associated with type 2 diabetes mellitus, limited to breakdown of skin ([place])    Plan:  Patient was evaluated and treated and all questions answered.  Ulcer right foot -Wound continues to be slow to heal.  Gently debrided nonprocedurally today. -Pending CT scan. -Once he gets the CT scan we can follow-up for further evaluation and surgical planning  No follow-ups on file.

## 2020-09-05 ENCOUNTER — Telehealth: Payer: Self-pay | Admitting: Urology

## 2020-09-05 NOTE — Telephone Encounter (Signed)
DOS - 09/07/20   BONE BIOPSY X'S 2 RIGHT --- [id number] DEBRIDE WOUND RIGHT --- 11043 POSS. SKIN GRAFT --- [id number]  [employer] EFFECTIVE DATE - 06/05/2019  RECEIVED EMAIL STATING THAT CPT CODES 29562, [place] AND 13086 HAVE BEEN APPROVED, AUTH # [id number].

## 2020-09-07 ENCOUNTER — Other Ambulatory Visit: Payer: Self-pay | Admitting: Podiatry

## 2020-09-07 DIAGNOSIS — M86671 Other chronic osteomyelitis, right ankle and foot: Secondary | ICD-10-CM

## 2020-09-07 MED ORDER — CLINDAMYCIN HCL 150 MG PO CAPS: 150.0000 mg | ORAL_CAPSULE | Freq: Two times a day (BID) | ORAL | 0 refills | Status: DC

## 2020-09-07 NOTE — Progress Notes (Signed)
Rx sent to pharmacy for outpatient surgery. °

## 2020-09-09 ENCOUNTER — Encounter: Payer: Self-pay | Admitting: Podiatry

## 2020-09-13 ENCOUNTER — Other Ambulatory Visit: Payer: Self-pay

## 2020-09-13 ENCOUNTER — Ambulatory Visit (INDEPENDENT_AMBULATORY_CARE_PROVIDER_SITE_OTHER): Payer: 59 | Admitting: Podiatry

## 2020-09-13 DIAGNOSIS — L97411 Non-pressure chronic ulcer of right heel and midfoot limited to breakdown of skin: Secondary | ICD-10-CM | POA: Diagnosis not present

## 2020-09-13 DIAGNOSIS — E11621 Type 2 diabetes mellitus with foot ulcer: Secondary | ICD-10-CM

## 2020-09-13 DIAGNOSIS — M14671 Charcot's joint, right ankle and foot: Secondary | ICD-10-CM

## 2020-09-13 DIAGNOSIS — M868X7 Other osteomyelitis, ankle and foot: Secondary | ICD-10-CM

## 2020-09-14 ENCOUNTER — Encounter: Payer: Self-pay | Admitting: Podiatry

## 2020-09-16 NOTE — Progress Notes (Signed)
  Subjective:  Patient ID: Javier Hunt., male    DOB: 09-17-1975,  MRN: [id number]  Chief Complaint  Patient presents with  . Wound Check    Pt denies any concerns, denies fever/chills/nausea/vomiting.   45 y.o. male presents for wound care f/u.  Got the CT scan and is here for further review Objective:  Physical Exam: Wound Location: right heel Wound Measurement: 1*0.5 Wound Base: Fully granular  peri-wound: Normal Exudate: Scant/small amount Serosanguinous exudate Wound without warmth erythema, signs of infection today.    Assessment:   1. Other osteomyelitis of right foot ([place])   2. Charcot's joint of right foot   3. Diabetic ulcer of right heel associated with type 2 diabetes mellitus, limited to breakdown of skin ([place])    Plan:  Patient was evaluated and treated and all questions answered.  Ulcer right foot -Reviewed CT findings with patient -Wound improving slightly smaller.  Dressed with antibiotic ointment and Band-Aid -Discussed proceeding with bone biopsy to ensure no infection given it is not ruled out by the CT.  After negative biopsies can plan for Charcot reconstruction -Patient has failed all conservative therapy and wishes to proceed with surgical intervention. All risks, benefits, and alternatives discussed with patient. No guarantees given. Consent reviewed and signed by patient. -Planned procedures: Bone biopsy right calcaneus   No follow-ups on file.

## 2020-09-23 ENCOUNTER — Other Ambulatory Visit: Payer: Self-pay

## 2020-09-23 ENCOUNTER — Ambulatory Visit (INDEPENDENT_AMBULATORY_CARE_PROVIDER_SITE_OTHER): Payer: 59 | Admitting: Podiatry

## 2020-09-23 DIAGNOSIS — E1142 Type 2 diabetes mellitus with diabetic polyneuropathy: Secondary | ICD-10-CM | POA: Diagnosis not present

## 2020-09-23 DIAGNOSIS — L97411 Non-pressure chronic ulcer of right heel and midfoot limited to breakdown of skin: Secondary | ICD-10-CM

## 2020-09-23 DIAGNOSIS — M14671 Charcot's joint, right ankle and foot: Secondary | ICD-10-CM

## 2020-09-23 DIAGNOSIS — E11621 Type 2 diabetes mellitus with foot ulcer: Secondary | ICD-10-CM

## 2020-09-23 DIAGNOSIS — M868X7 Other osteomyelitis, ankle and foot: Secondary | ICD-10-CM | POA: Diagnosis not present

## 2020-09-27 ENCOUNTER — Encounter: Payer: 59 | Admitting: Podiatry

## 2020-09-29 ENCOUNTER — Telehealth: Payer: Self-pay | Admitting: Urology

## 2020-09-29 NOTE — Telephone Encounter (Signed)
DOS - 10/12/20  CLOSED TREATMENT OF FRACTURE RIGHT  --- [id number] ARTHRODESIS SUBTALAR RIGHT --- [id number]  [employer] EFFECTIVE DATE - 06/05/19  SPOKE WITH KASEY AND SHE TOLDED ME TO CHECK ON THE WEBSITE TO SEE IF AUTH IS REQUIRE AND SHE WALKED ME THROUGH HOW TO GET THERE AND WHAT I WAS LOOKING FOR. WE BOTH LOOKED AT THE FORM AND FOR THE CODES 09811 AND 91478 NO PRIOR AUTH IS REQUIRED.   REF # [place] 09/29/20

## 2020-10-05 NOTE — Progress Notes (Signed)
  Subjective:  Patient ID: Javier Hunt., male    DOB: 21-May-1976,  MRN: [id number]  Chief Complaint  Patient presents with  . Routine Post Op    POV #1 DOS 09/07/2020 RT CALCANEUS BONE BIOPSY X 2, DEBRIDEMENT OF WOUND, POSSIBLE SKIN GRAFT SUBSTITUTE    DOS: 09/07/20 Procedure: Calcaneus bone biopsy x2 right, wound debridement right plantar heel  45 y.o. male presents with the above complaint. History confirmed with patient.   Objective:  Physical Exam: tenderness at the surgical site, local edema noted and calf supple, nontender. Incision: healing well, no significant drainage, no dehiscence.  Plantar heel wound healing well with skin edges coapted and intact suture    Assessment:   1. Charcot's joint of right foot   2. Other osteomyelitis of right foot ([place])   3. Diabetic ulcer of right heel associated with type 2 diabetes mellitus, limited to breakdown of skin ([place])     Plan:  Patient was evaluated and treated and all questions answered.  Post-operative State -Leave suture intact for 1 additional week -Wound dressed with Mepilex foam border dressing. -Awaiting bone biopsy results.  We will plan for surgical reconstruction of his foot at that time.  Hopefully the wound is healed we will fully healed by then  Return in about 1 week (around 09/20/2020) for bone biopsy f/u and surgical planning visit.

## 2020-10-06 NOTE — Progress Notes (Signed)
  Subjective:  Patient ID: Javier Hunt., male    DOB: 08-28-1975,  MRN: [id number]  Chief Complaint  Patient presents with  . Routine Post Op    POV #2 DOS 09/07/2020 RT CALCANEUS BONE BIOPSY X 2, DEBRIDEMENT OF WOUND, POSSIBLE SKIN GRAFT SUBSTITUTE    DOS: 09/07/20 Procedure: Calcaneus bone biopsy x2 right, wound debridement right plantar heel  45 y.o. male presents with the above complaint. History confirmed with patient. States the wound is looking better   Objective:  Physical Exam: tenderness at the surgical site, local edema noted and calf supple, nontender. Incision: healing well, no significant drainage, no dehiscence.  Plantar heel wound healing well with skin edges coapted and intact suture  Assessment:   1. Charcot's joint of right foot   2. Other osteomyelitis of right foot ([place])   3. Diabetic ulcer of right heel associated with type 2 diabetes mellitus, limited to breakdown of skin ([place])   4. DM type 2 with diabetic peripheral neuropathy (HCC)   5. Charcot ankle, right     Plan:  Patient was evaluated and treated and all questions answered.  Post-operative State -His bone cultures have had no growth.  At this point I think it is reasonable to plan for his reconstructive surgery.  We did discuss the goals of surgery as well as the risk.  His A1c is acceptable risk for surgery and should not increase his risk of infection. -Patient has failed all conservative therapy and wishes to proceed with surgical intervention. All risks, benefits, and alternatives discussed with patient. No guarantees given. Consent reviewed and signed by patient. -Planned procedures: Right foot and ankle tibiotalar calcaneal fusion with plate and rod fixation with harvest of bone graft as indicated, possible fusion Ameeth midfoot joints including medial column fusion -ASA 3 - Patient with moderate systemic disease with functional limitations; Risk factors: DM, DPN  -Post-op anticoagulation:  Lovenox '40mg'$  qd    No follow-ups on file.

## 2020-10-12 ENCOUNTER — Other Ambulatory Visit: Payer: Self-pay | Admitting: Podiatry

## 2020-10-12 ENCOUNTER — Encounter: Payer: Self-pay | Admitting: Podiatry

## 2020-10-12 DIAGNOSIS — M14671 Charcot's joint, right ankle and foot: Secondary | ICD-10-CM | POA: Diagnosis not present

## 2020-10-12 DIAGNOSIS — M216X1 Other acquired deformities of right foot: Secondary | ICD-10-CM

## 2020-10-12 MED ORDER — OXYCODONE HCL 5 MG PO TABS: 10.0000 mg | ORAL_TABLET | ORAL | 0 refills | Status: DC | PRN

## 2020-10-12 MED ORDER — CLINDAMYCIN HCL 300 MG PO CAPS: 300.0000 mg | ORAL_CAPSULE | Freq: Two times a day (BID) | ORAL | 0 refills | Status: DC

## 2020-10-12 NOTE — Progress Notes (Signed)
Rx sent to pharmacy for outpatient surgery. °

## 2020-10-14 ENCOUNTER — Other Ambulatory Visit: Payer: Self-pay

## 2020-10-14 ENCOUNTER — Emergency Department (HOSPITAL_COMMUNITY): Payer: 59

## 2020-10-14 ENCOUNTER — Telehealth: Payer: Self-pay | Admitting: Podiatry

## 2020-10-14 ENCOUNTER — Emergency Department (HOSPITAL_BASED_OUTPATIENT_CLINIC_OR_DEPARTMENT_OTHER): Payer: 59

## 2020-10-14 ENCOUNTER — Observation Stay (HOSPITAL_COMMUNITY)
Admission: EM | Admit: 2020-10-14 | Discharge: 2020-10-16 | Disposition: A | Discharge status: Home / Self Care | Payer: 59 | Attending: Family Medicine | Admitting: Family Medicine

## 2020-10-14 ENCOUNTER — Encounter (HOSPITAL_COMMUNITY): Payer: Self-pay

## 2020-10-14 DIAGNOSIS — Z9641 Presence of insulin pump (external) (internal): Secondary | ICD-10-CM

## 2020-10-14 DIAGNOSIS — Z7982 Long term (current) use of aspirin: Secondary | ICD-10-CM | POA: Diagnosis not present

## 2020-10-14 DIAGNOSIS — Z79899 Other long term (current) drug therapy: Secondary | ICD-10-CM | POA: Diagnosis not present

## 2020-10-14 DIAGNOSIS — Z9889 Other specified postprocedural states: Secondary | ICD-10-CM | POA: Diagnosis not present

## 2020-10-14 DIAGNOSIS — N184 Chronic kidney disease, stage 4 (severe): Secondary | ICD-10-CM | POA: Diagnosis not present

## 2020-10-14 DIAGNOSIS — I129 Hypertensive chronic kidney disease with stage 1 through stage 4 chronic kidney disease, or unspecified chronic kidney disease: Secondary | ICD-10-CM | POA: Diagnosis not present

## 2020-10-14 DIAGNOSIS — R739 Hyperglycemia, unspecified: Secondary | ICD-10-CM

## 2020-10-14 DIAGNOSIS — R5082 Postprocedural fever: Secondary | ICD-10-CM | POA: Diagnosis present

## 2020-10-14 DIAGNOSIS — I10 Essential (primary) hypertension: Secondary | ICD-10-CM | POA: Diagnosis present

## 2020-10-14 DIAGNOSIS — Z794 Long term (current) use of insulin: Secondary | ICD-10-CM | POA: Diagnosis not present

## 2020-10-14 DIAGNOSIS — M79604 Pain in right leg: Secondary | ICD-10-CM

## 2020-10-14 DIAGNOSIS — R0789 Other chest pain: Secondary | ICD-10-CM | POA: Diagnosis present

## 2020-10-14 DIAGNOSIS — E1022 Type 1 diabetes mellitus with diabetic chronic kidney disease: Secondary | ICD-10-CM | POA: Insufficient documentation

## 2020-10-14 DIAGNOSIS — Z20822 Contact with and (suspected) exposure to covid-19: Secondary | ICD-10-CM | POA: Insufficient documentation

## 2020-10-14 DIAGNOSIS — N289 Disorder of kidney and ureter, unspecified: Secondary | ICD-10-CM | POA: Diagnosis not present

## 2020-10-14 DIAGNOSIS — R079 Chest pain, unspecified: Secondary | ICD-10-CM

## 2020-10-14 DIAGNOSIS — E10621 Type 1 diabetes mellitus with foot ulcer: Secondary | ICD-10-CM | POA: Diagnosis present

## 2020-10-14 LAB — RESP PANEL BY RT-PCR (FLU A&B, COVID) ARPGX2
Influenza A by PCR: NEGATIVE
Influenza B by PCR: NEGATIVE
SARS Coronavirus 2 by RT PCR: NEGATIVE

## 2020-10-14 LAB — CBC
HCT: 35.1 % — ABNORMAL LOW (ref 39.0–52.0)
Hemoglobin: 11.1 g/dL — ABNORMAL LOW (ref 13.0–17.0)
MCH: 26.7 pg (ref 26.0–34.0)
MCHC: 31.6 g/dL (ref 30.0–36.0)
MCV: 84.4 fL (ref 80.0–100.0)
Platelets: 255 10*3/uL (ref 150–400)
RBC: 4.16 MIL/uL — ABNORMAL LOW (ref 4.22–5.81)
RDW: 12.5 % (ref 11.5–15.5)
WBC: 18.6 10*3/uL — ABNORMAL HIGH (ref 4.0–10.5)
nRBC: 0 % (ref 0.0–0.2)

## 2020-10-14 LAB — TROPONIN I (HIGH SENSITIVITY)
Troponin I (High Sensitivity): 145 ng/L (ref <18)
Troponin I (High Sensitivity): 120 ng/L (ref <18)

## 2020-10-14 LAB — BASIC METABOLIC PANEL
Anion gap: 8 (ref 5–15)
BUN: 34 mg/dL — ABNORMAL HIGH (ref 6–20)
CO2: 26 mmol/L (ref 22–32)
Calcium: 8.9 mg/dL (ref 8.9–10.3)
Chloride: 100 mmol/L (ref 98–111)
Creatinine, Ser: 2.36 mg/dL — ABNORMAL HIGH (ref 0.61–1.24)
GFR, Estimated: 34 mL/min — ABNORMAL LOW (ref >60)
Glucose, Bld: 216 mg/dL — ABNORMAL HIGH (ref 70–99)
Potassium: 3.8 mmol/L (ref 3.5–5.1)
Sodium: 134 mmol/L — ABNORMAL LOW (ref 135–145)

## 2020-10-14 LAB — LACTIC ACID, PLASMA: Lactic Acid, Venous: 1.1 mmol/L (ref 0.5–1.9)

## 2020-10-14 MED ORDER — SODIUM CHLORIDE 0.9 % IV BOLUS
1000.0000 mL | Freq: Once | INTRAVENOUS | Status: AC
  Administered 2020-10-14: 1000 mL via INTRAVENOUS

## 2020-10-14 MED ORDER — SODIUM CHLORIDE 0.9 % IV SOLN: INTRAVENOUS | Status: DC

## 2020-10-14 MED ORDER — ONDANSETRON HCL 4 MG/2ML IJ SOLN
4.0000 mg | Freq: Once | INTRAMUSCULAR | Status: AC
  Administered 2020-10-14: 4 mg via INTRAVENOUS
  Filled 2020-10-14: qty 2

## 2020-10-14 MED ORDER — HYDROMORPHONE HCL 1 MG/ML IJ SOLN
0.5000 mg | Freq: Once | INTRAMUSCULAR | Status: AC
  Administered 2020-10-14: 0.5 mg via INTRAVENOUS
  Filled 2020-10-14: qty 1

## 2020-10-14 NOTE — ED Notes (Signed)
Dr. Rogene Houston notified of patients temperature.

## 2020-10-14 NOTE — ED Provider Notes (Signed)
Emergency Medicine Provider Triage Evaluation Note  Javier Hunt , a 45 y.o. male  was evaluated in triage.  Pt complains of right leg swelling, chest tightness, SHOB after ankle surgery 2 days ago, no history of PE/DVT, no fevers.  Review of Systems  Positive: SHOB, chest tightness, leg swelling Negative: Abdominal pain, loss of bowel or bladder control, fever  Physical Exam  BP (!) 152/78 (BP Location: Left Arm)   Pulse 75   Temp 99.1 F (37.3 C) (Oral)   Resp 18   SpO2 98%  Gen:   Awake, no distress   Resp:  Normal effort  MSK:   Moves extremities without difficulty  Other:  No calf tenderness, does report pain to right groin area  Medical Decision Making  Medically screening exam initiated at 1:42 PM.  Appropriate orders placed.  Javier Hunt. was informed that the remainder of the evaluation will be completed by another provider, this initial triage assessment does not replace that evaluation, and the importance of remaining in the ED until their evaluation is complete.     Tacy Learn, PA-C 10/14/20 1343    Fredia Sorrow, MD 10/14/20 2127

## 2020-10-14 NOTE — ED Notes (Signed)
ED provider notified of troponin of 145. No additional orders at this time. Will continue to monitor.

## 2020-10-14 NOTE — ED Provider Notes (Addendum)
[facility] DEPT Provider Note   CSN: [id number] Arrival date & time: 10/14/20  1254     History Chief Complaint  Patient presents with  . Back Pain  . post op issue  . Hyperglycemia  . Chest Pain    Javier Hunt. is a 45 y.o. male.  Patient is a type I diabetic.  Has an insulin pump.  Patient is status post surgery to his right foot for Charcot's joint.  Done by podiatry.  They put hardware in it.  This was done 2 days ago.  Since that time patient has not felt well.  He has had fever feeling 100.4 here today.  Patient is also had a complaint of sore throat.  Some chest discomfort but no severe chest pain some body aches.  Trouble with his blood sugars were running higher than normal.  Perhaps some mild shortness of breath.  Patient's past medical history is also significant for hypertension.  Also in March 2021 patient had acute osteomyelitis of the right calcaneus.  Patient stated that that infection all cleared prior to the surgery.  Following the surgery they put him on Cleocin which she is taking.  Patient has a foot boot.  But is not allowed to weight-bear on it yet.        Past Medical History:  Diagnosis Date  . Acute osteomyelitis of right calcaneus ([place])    s/p debiidement and wound vac 08-28-2019  . Anemia of chronic renal failure, stage 3a ([place]) 09/10/2019   sees dr Kathaleen Maser 09-23-2019 on chart  . Charcot's joint of right ankle   . Chronic heel ulcer, right, limited to breakdown of skin ([place])    since march 2021, has wound vac since 10-27-2019 changed vac 10-12-2019  . DM type 1 (diabetes mellitus, type 1) ([place])   . Erythropoietin deficiency anemia 09/10/2019  . Hyperkalemia   . Hypertension   . Iron deficiency anemia due to chronic blood loss 09/10/2019  . Kidney disease   . Neuropathy    feet and toes  . Sleep apnea    cpap     Patient Active Problem List   Diagnosis Date Noted  . Diabetic neuropathy ([place]) 05/31/2020  .  Edema 05/31/2020  . Erectile dysfunction 05/31/2020  . Essential hypertension 05/31/2020  . Hyperglycemia due to type 1 diabetes mellitus ([place]) 05/31/2020  . Hypocalcemia 05/31/2020  . Iron deficiency 05/31/2020  . Presence of insulin pump (external) (internal) 05/31/2020  . Pure hypercholesterolemia 05/31/2020  . Vitamin D deficiency 05/31/2020  . Iron deficiency anemia due to chronic blood loss 09/10/2019  . Anemia of chronic renal failure, stage 3a ([place]) 09/10/2019  . Erythropoietin deficiency anemia 09/10/2019  . Acute osteomyelitis of right calcaneus ([place])   . Charcot foot due to diabetes mellitus ([place]) 08/27/2019  . Diabetic foot ulcer ([place]) 08/27/2019  . Edema of right lower extremity 08/27/2019  . CKD (chronic kidney disease), stage III ([place]) 08/27/2019  . Peripheral edema   . SOB (shortness of breath)     Past Surgical History:  Procedure Laterality Date  . ANKLE SURGERY Right   . APPENDECTOMY  yrs ago  . GRAFT APPLICATION Right [id number]   Procedure: GRAFT APPLICATION;  Surgeon: Evelina Bucy, DPM;  Location: [facility];  Service: Podiatry;  Laterality: Right;  . I & D EXTREMITY Right 10/16/2019   Procedure: IRRIGATION AND DEBRIDEMENT OF RIGHT HEEL WITH APPLICATION WOUND VAC;  Surgeon: Evelina Bucy, DPM;  Location: [facility];  Service: Podiatry;  Laterality: Right;  . INCISION AND DRAINAGE OF WOUND Right 10/28/2019   Procedure: IRRIGATION AND DEBRIDEMENT WOUND RIGHT HEEL;  Surgeon: Evelina Bucy, DPM;  Location: [facility];  Service: Podiatry;  Laterality: Right;  . IRRIGATION AND DEBRIDEMENT FOOT Right 08/28/2019   Procedure: RIght foot wound incision and drainage, debridement and irrigation, bone biopsy, application of wound VAC;  Surgeon: Evelina Bucy, DPM;  Location: [facility];  Service: Podiatry;  Laterality: Right;       Family History  Problem Relation Age of Onset  . Heart failure Mother   . Stroke  Mother   . Cancer Mother   . Cancer Father   . Heart failure Father     Social History   Tobacco Use  . Smoking status: Never Smoker  . Smokeless tobacco: Never Used  Vaping Use  . Vaping Use: Never used  Substance Use Topics  . Alcohol use: No  . Drug use: Never    Home Medications Prior to Admission medications   Medication Sig Start Date End Date Taking? Authorizing Provider  aspirin 81 MG tablet Take 81 mg by mouth daily.    [provider]  atorvastatin (LIPITOR) 40 MG tablet Take 40 mg by mouth daily at 6 PM.  08/11/17   [provider]  becaplermin (REGRANEX) 0.01 % gel Apply 1 application topically daily. 01/19/20   Evelina Bucy, DPM  clindamycin (CLEOCIN) 300 MG capsule Take 1 capsule (300 mg total) by mouth 2 (two) times daily. 10/12/20   Evelina Bucy, DPM  CONTOUR NEXT TEST test strip USE 8 TIMES DAILY AND AS DIRECTED 05/16/17   [provider]  ezetimibe (ZETIA) 10 MG tablet Take 10 mg by mouth daily. 07/14/19   [provider]  ferrous sulfate 325 (65 FE) MG tablet Take 1 tablet (325 mg total) by mouth 2 (two) times daily with a meal. Patient taking differently: Take 325 mg by mouth daily with breakfast. 08/12/19   Georgette Shell, MD  furosemide (LASIX) 20 MG tablet 1 tablet    [provider]  HUMALOG 100 UNIT/ML injection SMARTSIG:0-100 Unit(s) SUB-Q Daily 02/11/20   [provider]  insulin aspart (NOVOLOG) 100 UNIT/ML injection 100U/DAY VIA PUMP SUBCUTANEOUSLY 03/02/19   [provider]  Insulin Human (INSULIN PUMP) SOLN Inject into the skin. Novolog. Base: 25.45 units per day.  1 unit per 10 grams of carbohydrates.  Basal rate varies usually 0.95    [provider]  metoprolol succinate (TOPROL-XL) 50 MG 24 hr tablet Take 50 mg by mouth daily. Take with or immediately following a meal.    [provider]  metoprolol tartrate (LOPRESSOR) 25 MG tablet Take 25 mg by mouth daily.  12/23/19   [provider]  olmesartan (BENICAR) 20 MG tablet Take 20 mg by mouth daily. 09/08/20   [provider]  olmesartan (BENICAR) 5 MG tablet Take 10 mg by mouth daily. 07/14/19   [provider]  omeprazole (PRILOSEC) 40 MG capsule Take 40 mg by mouth daily. 06/22/17   [provider]  oxyCODONE (ROXICODONE) 5 MG immediate release tablet Take 2 tablets (10 mg total) by mouth every 4 (four) hours as needed for severe pain. 10/12/20   Evelina Bucy, DPM  oxyCODONE-acetaminophen (PERCOCET) 5-325 MG tablet Take 1 tablet by mouth every 4 (four) hours as needed for severe pain. 10/28/19   Evelina Bucy, DPM  silver  sulfADIAZINE (SILVADENE) 1 % cream Apply pea-sized amount to wound daily. 03/29/20   Evelina Bucy, DPM  tadalafil (CIALIS) 20 MG tablet Take 20 mg by mouth daily as needed for erectile dysfunction.  05/15/17   [provider]  Vitamin D, Ergocalciferol, (DRISDOL) 50000 UNITS CAPS capsule Take 50,000 Units by mouth 2 (two) times a week. Monday and Wednesday    [provider]    Allergies    Lisinopril and Keflex [cephalexin]  Review of Systems   Review of Systems  Constitutional: Positive for fatigue and fever. Negative for chills.  HENT: Negative for congestion, rhinorrhea and sore throat.   Eyes: Negative for visual disturbance.  Respiratory: Negative for cough and shortness of breath.   Cardiovascular: Negative for chest pain and leg swelling.  Gastrointestinal: Negative for abdominal pain, diarrhea, nausea and vomiting.  Genitourinary: Negative for dysuria.  Musculoskeletal: Positive for myalgias. Negative for back pain and neck pain.  Skin: Negative for rash.  Neurological: Negative for dizziness, light-headedness and headaches.  Hematological: Does not bruise/bleed easily.  Psychiatric/Behavioral: Negative for confusion.    Physical Exam Updated Vital Signs BP (!) 145/80   Pulse 87   Temp 99.9 F (37.7 C)  (Oral)   Resp 16   Ht 1.676 m ('5\' 6"'$ )   Wt 93.9 kg   SpO2 93%   BMI 33.41 kg/m   Physical Exam Vitals and nursing note reviewed.  Constitutional:      General: He is not in acute distress.    Appearance: Normal appearance. He is well-developed.  HENT:     Head: Normocephalic and atraumatic.     Mouth/Throat:     Mouth: Mucous membranes are dry.  Eyes:     Extraocular Movements: Extraocular movements intact.     Conjunctiva/sclera: Conjunctivae normal.     Pupils: Pupils are equal, round, and reactive to light.  Cardiovascular:     Rate and Rhythm: Normal rate and regular rhythm.     Heart sounds: No murmur heard.   Pulmonary:     Effort: Pulmonary effort is normal. No respiratory distress.     Breath sounds: Normal breath sounds.  Abdominal:     Palpations: Abdomen is soft.     Tenderness: There is no abdominal tenderness.  Musculoskeletal:        General: Swelling and tenderness present. Normal range of motion.     Cervical back: Normal range of motion and neck supple.     Comments: Wounds to patient's right foot and ankle examined.  No evidence of any significant infection.  Will be redressed with Ace wrap and put back into his walking boot.  Good cap refill.  Sensation intact to top of the foot bottom of the foot.  No calf tenderness or tightness.  Skin:    General: Skin is warm and dry.     Capillary Refill: Capillary refill takes less than 2 seconds.  Neurological:     General: No focal deficit present.     Mental Status: He is alert and oriented to person, place, and time.     Cranial Nerves: No cranial nerve deficit.     Sensory: No sensory deficit.     Motor: No weakness.     ED Results / Procedures / Treatments   Labs (all labs ordered are listed, but only abnormal results are displayed) Labs Reviewed  BASIC METABOLIC PANEL - Abnormal; Notable for the following components:      Result Value   Sodium 134 (*)  Glucose, Bld 216 (*)    BUN 34 (*)     Creatinine, Ser 2.36 (*)    GFR, Estimated 34 (*)    All other components within normal limits  CBC - Abnormal; Notable for the following components:   WBC 18.6 (*)    RBC 4.16 (*)    Hemoglobin 11.1 (*)    HCT 35.1 (*)    All other components within normal limits  TROPONIN I (HIGH SENSITIVITY) - Abnormal; Notable for the following components:   Troponin I (High Sensitivity) 145 (*)    All other components within normal limits  TROPONIN I (HIGH SENSITIVITY) - Abnormal; Notable for the following components:   Troponin I (High Sensitivity) 120 (*)    All other components within normal limits  TROPONIN I (HIGH SENSITIVITY) - Abnormal; Notable for the following components:   Troponin I (High Sensitivity) 102 (*)    All other components within normal limits  RESP PANEL BY RT-PCR (FLU A&B, COVID) ARPGX2  LACTIC ACID, PLASMA    EKG EKG Interpretation  Date/Time:  Friday Oct 14 2020 13:45:06 EDT Ventricular Rate:  70 PR Interval:  132 QRS Duration: 84 QT Interval:  386 QTC Calculation: 416 R Axis:   21 Text Interpretation: Normal sinus rhythm Normal ECG Confirmed by Fredia Sorrow [id number]) on 10/14/2020 6:37:56 PM   Radiology DG Chest 1 View  Result Date: 10/14/2020 CLINICAL DATA:  Chest tightness EXAM: CHEST  1 VIEW COMPARISON:  August 12, 2019 FINDINGS: The cardiomediastinal silhouette is normal in contour. No pleural effusion. No pneumothorax. No acute pleuroparenchymal abnormality. Visualized abdomen is unremarkable. No acute osseous abnormality noted. IMPRESSION: No acute cardiopulmonary abnormality. Electronically Signed   By: Valentino Saxon MD   On: 10/14/2020 14:37   DG Ankle Complete Right  Result Date: 10/14/2020 CLINICAL DATA:  Postop EXAM: RIGHT ANKLE - COMPLETE 3+ VIEW COMPARISON:  06/21/2020, CT 08/24/2020 FINDINGS: Interval resection of distal fibula with smooth appearing cut margin of the fibula. Interval surgical plate and multiple screw fixation of the tibia,  talus, and calcaneus. Hardware appears grossly intact. Redemonstrated Charcot arthropathy of the midfoot with bony destructive change, fragmentation and collapse. Generalized soft tissue swelling. IMPRESSION: Interval operative changes of the left ankle and hindfoot including resection of distal fibula and placement of fixating plate and surgical screws extending from the distal tibia to the calcaneus. Charcot arthropathy of the mid foot as before, though limits assessment for acute process. Electronically Signed   By: Donavan Foil M.D.   On: 10/14/2020 22:36   DG Foot Complete Right  Result Date: 10/14/2020 CLINICAL DATA:  Postop fever EXAM: RIGHT FOOT COMPLETE - 3+ VIEW COMPARISON:  CT 06/26/2020, radiograph 06/21/2020 FINDINGS: Soft tissue swelling is present. Charcot arthropathy of the midfoot. Vascular calcifications. Osseous demineralization without frank destructive change at the digits or metatarsals. IMPRESSION: No definite acute osseous abnormality involving the digits or metatarsals of the right foot Electronically Signed   By: Donavan Foil M.D.   On: 10/14/2020 22:37   VAS [place] LOWER EXTREMITY VENOUS (DVT) (ONLY [facility] & [facility] 7a-7p)  Result Date: 10/14/2020  Lower Venous DVT Study Patient Name:  Brylee Walquist.  Date of Exam:   10/14/2020 Medical Rec #: [id number]             Accession #:    [id number] Date of Birth: June 30, 1975              Patient Gender: M Patient Age:   57Y Exam Location:  [facility] Procedure:      VAS [place] LOWER EXTREMITY VENOUS (DVT) Referring Phys: [id number] LAURA A MURPHY --------------------------------------------------------------------------------  Indications: RLE upper leg pain post ankle surgery.  Limitations: Poor ultrasound/tissue interface. Comparison Study: Previous exam 08/27/19 - negative Performing Technologist: Rogelia Rohrer  Examination Guidelines: A complete evaluation includes B-mode imaging, spectral Doppler, color Doppler, and power Doppler as needed of  all accessible portions of each vessel. Bilateral testing is considered an integral part of a complete examination. Limited examinations for reoccurring indications may be performed as noted. The reflux portion of the exam is performed with the patient in reverse Trendelenburg.  +---------+---------------+---------+-----------+----------+-------------------+ RIGHT    CompressibilityPhasicitySpontaneityPropertiesThrombus Aging      +---------+---------------+---------+-----------+----------+-------------------+ CFV      Full           Yes      Yes                                      +---------+---------------+---------+-----------+----------+-------------------+ SFJ      Full                                                             +---------+---------------+---------+-----------+----------+-------------------+ FV Prox  Full           Yes      Yes                                      +---------+---------------+---------+-----------+----------+-------------------+ FV Mid   Full           Yes      Yes                                      +---------+---------------+---------+-----------+----------+-------------------+ FV DistalFull           Yes      Yes                                      +---------+---------------+---------+-----------+----------+-------------------+ PFV      Full                                                             +---------+---------------+---------+-----------+----------+-------------------+ POP      Full           Yes      Yes                                      +---------+---------------+---------+-----------+----------+-------------------+ PTV      Full  Not well visualized +---------+---------------+---------+-----------+----------+-------------------+ PERO     Full                                         Not well visualized  +---------+---------------+---------+-----------+----------+-------------------+   +----+---------------+---------+-----------+----------+--------------+ LEFTCompressibilityPhasicitySpontaneityPropertiesThrombus Aging +----+---------------+---------+-----------+----------+--------------+ CFV Full           Yes      Yes                                 +----+---------------+---------+-----------+----------+--------------+    Summary: RIGHT: - There is no evidence of deep vein thrombosis in the lower extremity. - There is no evidence of superficial venous thrombosis.  - No cystic structure found in the popliteal fossa.  LEFT: - No evidence of common femoral vein obstruction.  *See table(s) above for measurements and observations. Electronically signed by Servando Snare MD on 10/14/2020 at 4:55:23 PM.    Final     Procedures Procedures  CRITICAL CARE Performed by: Fredia Sorrow Total critical care time: 45  minutes Critical care time was exclusive of separately billable procedures and treating other patients. Critical care was necessary to treat or prevent imminent or life-threatening deterioration. Critical care was time spent personally by me on the following activities: development of treatment plan with patient and/or surrogate as well as nursing, discussions with consultants, evaluation of patient's response to treatment, examination of patient, obtaining history from patient or surrogate, ordering and performing treatments and interventions, ordering and review of laboratory studies, ordering and review of radiographic studies, pulse oximetry and re-evaluation of patient's condition.   Medications Ordered in ED Medications  0.9 %  sodium chloride infusion ( Intravenous New Bag/Given 10/14/20 2200)  sodium chloride 0.9 % bolus 1,000 mL (has no administration in time range)  sodium chloride 0.9 % bolus 1,000 mL (0 mLs Intravenous Stopped 10/14/20 2337)  ondansetron (ZOFRAN) injection 4  mg (4 mg Intravenous Given 10/14/20 2157)  HYDROmorphone (DILAUDID) injection 0.5 mg (0.5 mg Intravenous Given 10/14/20 2158)    ED Course  I have reviewed the triage vital signs and the nursing notes.  Pertinent labs & imaging results that were available during my care of the patient were reviewed by me and considered in my medical decision making (see chart for details).    MDM Rules/Calculators/A&P                          Patient was medically screened by physician assistant.  They ordered Doppler studies of the right leg.  Which were negative for DVT.  Initial troponin was elevated at 145.  Patient with some generalized chest discomfort but nothing really acute.  And nothing was worse yesterday.  But overall has malaise some fatigue little bit of a sore throat some body aches.  Raising concerns for possible COVID infection but COVID and flu testing was negative.  Patient is also been having some trouble with hyperglycemia.  He is not on any steroids.  BUN and creatinine is elevated here but not drastically above normal.  But is consistent with some renal insufficiency.  Patient given 1 L of fluid.  Still not able to urinate.  Will be given a second liter of fluid.  Checking urinalysis in the face of the fever is important.  Patient's repeat troponins have gone down.  Most  recent 1 was down to 102.  No concerns for acute cardiac event.  Chest x-ray negative for signs of pneumonia.  Patient does have a leukocytosis with a white blood cell count of 18,000.  On electrolytes BUN 34 creatinine 2.36.  With a GFR 34.  CO2 is 26.  No evidence of any metabolic acidosis.  Will  give a second liter fluid check urinalysis.  Following second liter of fluid would go ahead and recheck his BUN and creatinine.  In addition we will go ahead and CT chest to make sure no occult pneumonia is identified.  Chest x-ray was negative.  Patient definitely of concern for possible pulmonary embolus.  Oxygen sats at  times have been low here.  Patient's heart rate up in the 80s.  Status post surgery.  Need to do CT angio despite the iodinated dye shortage.  Patient's GFR is in the 35 range.  We will need to reduce dye load anyways.  Patient giving second liter of fluid.  BMP has been reordered.  Can wait to recheck that to make sure GFR is improving.  Patient will be turned over to evening physician.  Also urinalysis pending.    Final Clinical Impression(s) / ED Diagnoses Final diagnoses:  Hyperglycemia  Atypical chest pain  Renal insufficiency    Rx / DC Orders ED Discharge Orders    None       Fredia Sorrow, MD 10/15/20 0013    Fredia Sorrow, MD 10/15/20 [id number]    Fredia Sorrow, MD 10/15/20 0022

## 2020-10-14 NOTE — ED Notes (Signed)
Patients right ankle wrapped with clean gauze and ace bandage.

## 2020-10-14 NOTE — Telephone Encounter (Signed)
Called patient. I advised I wanted him to get checked out in the emergency room. Patient verbalized understanding

## 2020-10-14 NOTE — ED Notes (Signed)
Patient asking for pain medication. Dr. Rogene Houston notified.

## 2020-10-14 NOTE — ED Notes (Signed)
CRITICAL VALUE STICKER  CRITICAL VALUE: Troponin 145  DATE & TIME NOTIFIED: 10/14/20 1809  MD NOTIFIED: Rogene Houston MD  TIME OF NOTIFICATION: [id number]  RESPONSE:

## 2020-10-14 NOTE — Progress Notes (Signed)
RLE venous duplex has been completed.  Preliminary results given to Suella Broad, PA.  Results can be found under chart review under CV PROC. 10/14/2020 4:44 PM Emani Morad RVT, RDMS

## 2020-10-14 NOTE — ED Triage Notes (Signed)
Patient c/o constant chest tightness since having surgery on his ankle 2 days ago.Patient  also c/o sliight SOB and hyperglycemia. Patient states he has been giving himself extra insulin.

## 2020-10-14 NOTE — Telephone Encounter (Signed)
Pt states he has had a fever, a tight chest and hasn't been feeling good since his surgery. He would like to know if this is normal. Please advise.

## 2020-10-14 NOTE — Telephone Encounter (Signed)
Can we get him to come in today to get checked out?

## 2020-10-15 ENCOUNTER — Other Ambulatory Visit: Payer: Self-pay | Admitting: Physician Assistant

## 2020-10-15 ENCOUNTER — Observation Stay (HOSPITAL_COMMUNITY): Payer: 59

## 2020-10-15 ENCOUNTER — Observation Stay (HOSPITAL_BASED_OUTPATIENT_CLINIC_OR_DEPARTMENT_OTHER): Payer: 59

## 2020-10-15 ENCOUNTER — Encounter (HOSPITAL_COMMUNITY): Payer: Self-pay | Admitting: Internal Medicine

## 2020-10-15 DIAGNOSIS — R079 Chest pain, unspecified: Secondary | ICD-10-CM

## 2020-10-15 DIAGNOSIS — N179 Acute kidney failure, unspecified: Secondary | ICD-10-CM | POA: Insufficient documentation

## 2020-10-15 DIAGNOSIS — R5082 Postprocedural fever: Secondary | ICD-10-CM | POA: Diagnosis present

## 2020-10-15 DIAGNOSIS — Z9641 Presence of insulin pump (external) (internal): Secondary | ICD-10-CM

## 2020-10-15 DIAGNOSIS — L97509 Non-pressure chronic ulcer of other part of unspecified foot with unspecified severity: Secondary | ICD-10-CM

## 2020-10-15 DIAGNOSIS — E10621 Type 1 diabetes mellitus with foot ulcer: Secondary | ICD-10-CM | POA: Diagnosis present

## 2020-10-15 DIAGNOSIS — N184 Chronic kidney disease, stage 4 (severe): Secondary | ICD-10-CM

## 2020-10-15 DIAGNOSIS — I10 Essential (primary) hypertension: Secondary | ICD-10-CM

## 2020-10-15 DIAGNOSIS — R0789 Other chest pain: Secondary | ICD-10-CM | POA: Diagnosis not present

## 2020-10-15 LAB — BASIC METABOLIC PANEL
Anion gap: 7 (ref 5–15)
Anion gap: 10 (ref 5–15)
BUN: 33 mg/dL — ABNORMAL HIGH (ref 6–20)
BUN: 32 mg/dL — ABNORMAL HIGH (ref 6–20)
CO2: 26 mmol/L (ref 22–32)
CO2: 24 mmol/L (ref 22–32)
Calcium: 8.3 mg/dL — ABNORMAL LOW (ref 8.9–10.3)
Calcium: 8.6 mg/dL — ABNORMAL LOW (ref 8.9–10.3)
Chloride: 104 mmol/L (ref 98–111)
Chloride: 103 mmol/L (ref 98–111)
Creatinine, Ser: 2.48 mg/dL — ABNORMAL HIGH (ref 0.61–1.24)
Creatinine, Ser: 2.28 mg/dL — ABNORMAL HIGH (ref 0.61–1.24)
GFR, Estimated: 32 mL/min — ABNORMAL LOW (ref >60)
GFR, Estimated: 35 mL/min — ABNORMAL LOW (ref >60)
Glucose, Bld: 283 mg/dL — ABNORMAL HIGH (ref 70–99)
Glucose, Bld: 242 mg/dL — ABNORMAL HIGH (ref 70–99)
Potassium: 3.9 mmol/L (ref 3.5–5.1)
Potassium: 3.9 mmol/L (ref 3.5–5.1)
Sodium: 137 mmol/L (ref 135–145)
Sodium: 137 mmol/L (ref 135–145)

## 2020-10-15 LAB — CBC
HCT: 31.5 % — ABNORMAL LOW (ref 39.0–52.0)
HCT: 31.6 % — ABNORMAL LOW (ref 39.0–52.0)
Hemoglobin: 9.8 g/dL — ABNORMAL LOW (ref 13.0–17.0)
Hemoglobin: 9.8 g/dL — ABNORMAL LOW (ref 13.0–17.0)
MCH: 26.3 pg (ref 26.0–34.0)
MCH: 26.6 pg (ref 26.0–34.0)
MCHC: 31 g/dL (ref 30.0–36.0)
MCHC: 31.1 g/dL (ref 30.0–36.0)
MCV: 84.7 fL (ref 80.0–100.0)
MCV: 85.9 fL (ref 80.0–100.0)
Platelets: 229 10*3/uL (ref 150–400)
Platelets: 234 10*3/uL (ref 150–400)
RBC: 3.68 MIL/uL — ABNORMAL LOW (ref 4.22–5.81)
RBC: 3.72 MIL/uL — ABNORMAL LOW (ref 4.22–5.81)
RDW: 12.3 % (ref 11.5–15.5)
RDW: 12.3 % (ref 11.5–15.5)
WBC: 14.3 10*3/uL — ABNORMAL HIGH (ref 4.0–10.5)
WBC: 14.6 10*3/uL — ABNORMAL HIGH (ref 4.0–10.5)
nRBC: 0 % (ref 0.0–0.2)
nRBC: 0 % (ref 0.0–0.2)

## 2020-10-15 LAB — GLUCOSE, CAPILLARY
Glucose-Capillary: 190 mg/dL — ABNORMAL HIGH (ref 70–99)
Glucose-Capillary: 257 mg/dL — ABNORMAL HIGH (ref 70–99)
Glucose-Capillary: 341 mg/dL — ABNORMAL HIGH (ref 70–99)

## 2020-10-15 LAB — TROPONIN I (HIGH SENSITIVITY)
Troponin I (High Sensitivity): 102 ng/L (ref <18)
Troponin I (High Sensitivity): 86 ng/L — ABNORMAL HIGH (ref <18)
Troponin I (High Sensitivity): 86 ng/L — ABNORMAL HIGH (ref <18)

## 2020-10-15 LAB — ECHOCARDIOGRAM COMPLETE
Area-P 1/2: 3.63 cm2
Height: 66 in
S' Lateral: 3.3 cm
Weight: 3281.6 oz

## 2020-10-15 LAB — URINALYSIS, ROUTINE W REFLEX MICROSCOPIC
Bilirubin Urine: NEGATIVE
Glucose, UA: >=500 mg/dL — AB
Ketones, ur: 5 mg/dL — AB
Leukocytes,Ua: NEGATIVE
Nitrite: NEGATIVE
Protein, ur: >=300 mg/dL — AB
Specific Gravity, Urine: 1.013 (ref 1.005–1.030)
pH: 5 (ref 5.0–8.0)

## 2020-10-15 LAB — HEPARIN LEVEL (UNFRACTIONATED)
Heparin Unfractionated: 0.59 IU/mL (ref 0.30–0.70)
Heparin Unfractionated: 0.5 IU/mL (ref 0.30–0.70)

## 2020-10-15 LAB — APTT: aPTT: 34 seconds (ref 24–36)

## 2020-10-15 LAB — CBG MONITORING, ED: Glucose-Capillary: 231 mg/dL — ABNORMAL HIGH (ref 70–99)

## 2020-10-15 LAB — PROCALCITONIN: Procalcitonin: 2.38 ng/mL

## 2020-10-15 LAB — PROTIME-INR
INR: 1.2 (ref 0.8–1.2)
Prothrombin Time: 14.8 seconds (ref 11.4–15.2)

## 2020-10-15 MED ORDER — HEPARIN BOLUS VIA INFUSION
3000.0000 [IU] | Freq: Once | INTRAVENOUS | Status: AC
  Administered 2020-10-15: 3000 [IU] via INTRAVENOUS
  Filled 2020-10-15: qty 3000

## 2020-10-15 MED ORDER — ACETAMINOPHEN 325 MG PO TABS: 650.0000 mg | ORAL_TABLET | Freq: Four times a day (QID) | ORAL | Status: DC | PRN

## 2020-10-15 MED ORDER — ONDANSETRON HCL 4 MG PO TABS: 4.0000 mg | ORAL_TABLET | Freq: Four times a day (QID) | ORAL | Status: DC | PRN

## 2020-10-15 MED ORDER — PANTOPRAZOLE SODIUM 40 MG PO TBEC
40.0000 mg | DELAYED_RELEASE_TABLET | Freq: Every day | ORAL | Status: DC
  Administered 2020-10-15 – 2020-10-16 (×2): 40 mg via ORAL
  Filled 2020-10-15: qty 1

## 2020-10-15 MED ORDER — ONDANSETRON HCL 4 MG/2ML IJ SOLN
4.0000 mg | Freq: Once | INTRAMUSCULAR | Status: AC
  Administered 2020-10-15: 4 mg via INTRAVENOUS
  Filled 2020-10-15: qty 2

## 2020-10-15 MED ORDER — ATORVASTATIN CALCIUM 40 MG PO TABS
40.0000 mg | ORAL_TABLET | Freq: Every day | ORAL | Status: DC
  Administered 2020-10-15: 40 mg via ORAL
  Filled 2020-10-15: qty 1

## 2020-10-15 MED ORDER — INSULIN PUMP
Freq: Three times a day (TID) | SUBCUTANEOUS | Status: DC
  Administered 2020-10-15: 5.6 via SUBCUTANEOUS
  Administered 2020-10-15: 8 via SUBCUTANEOUS
  Administered 2020-10-15: 1.5 via SUBCUTANEOUS
  Administered 2020-10-16: 2.5 via SUBCUTANEOUS
  Administered 2020-10-16: 1.5 via SUBCUTANEOUS
  Filled 2020-10-15: qty 1

## 2020-10-15 MED ORDER — ONDANSETRON HCL 4 MG/2ML IJ SOLN: 4.0000 mg | Freq: Four times a day (QID) | INTRAMUSCULAR | Status: DC | PRN

## 2020-10-15 MED ORDER — TECHNETIUM TO 99M ALBUMIN AGGREGATED: 4.4000 | Freq: Once | INTRAVENOUS | Status: DC | PRN

## 2020-10-15 MED ORDER — OXYCODONE HCL 5 MG PO TABS: 10.0000 mg | ORAL_TABLET | ORAL | Status: DC | PRN

## 2020-10-15 MED ORDER — SODIUM CHLORIDE 0.9 % IV BOLUS
1000.0000 mL | Freq: Once | INTRAVENOUS | Status: AC
  Administered 2020-10-15: 1000 mL via INTRAVENOUS

## 2020-10-15 MED ORDER — EZETIMIBE 10 MG PO TABS
10.0000 mg | ORAL_TABLET | Freq: Every day | ORAL | Status: DC
  Administered 2020-10-15 – 2020-10-16 (×2): 10 mg via ORAL
  Filled 2020-10-15: qty 1

## 2020-10-15 MED ORDER — METOPROLOL SUCCINATE ER 50 MG PO TB24: 50.0000 mg | ORAL_TABLET | Freq: Every day | ORAL | Status: DC

## 2020-10-15 MED ORDER — HYDRALAZINE HCL 20 MG/ML IJ SOLN
10.0000 mg | Freq: Four times a day (QID) | INTRAMUSCULAR | Status: DC | PRN
  Administered 2020-10-15 – 2020-10-16 (×2): 10 mg via INTRAVENOUS
  Filled 2020-10-15: qty 1

## 2020-10-15 MED ORDER — ASPIRIN 81 MG PO CHEW
81.0000 mg | CHEWABLE_TABLET | Freq: Every day | ORAL | Status: DC
  Administered 2020-10-15 – 2020-10-16 (×2): 81 mg via ORAL
  Filled 2020-10-15: qty 1

## 2020-10-15 MED ORDER — ACETAMINOPHEN 650 MG RE SUPP: 650.0000 mg | Freq: Four times a day (QID) | RECTAL | Status: DC | PRN

## 2020-10-15 MED ORDER — HEPARIN (PORCINE) 25000 UT/250ML-% IV SOLN
1500.0000 [IU]/h | INTRAVENOUS | Status: DC
  Administered 2020-10-15 (×2): 1500 [IU]/h via INTRAVENOUS
  Filled 2020-10-15 (×2): qty 250

## 2020-10-15 MED ORDER — METOPROLOL SUCCINATE ER 100 MG PO TB24
100.0000 mg | ORAL_TABLET | Freq: Every day | ORAL | Status: DC
  Administered 2020-10-15 – 2020-10-16 (×2): 100 mg via ORAL
  Filled 2020-10-15: qty 1

## 2020-10-15 NOTE — ED Notes (Signed)
Patient began to vomit. First time tonight. Dr. Wyvonnia Dusky notified. Waiting for medication order.

## 2020-10-15 NOTE — Consult Note (Signed)
Cardiology Consultation:   Patient ID: Javier Hunt. MRN: [id number]; DOB: 01-13-1976  Admit date: 10/14/2020 Date of Consult: 10/15/2020  PCP:  [name], MD   [facility] Providers Cardiologist:  New     Patient Profile:   Javier Quattrone. is a 45 y.o. male with a hx of diabetes mellitus, hypertension, sleep apnea, chronic stage IIIa kidney disease, history of osteomyelitis of right calcaneus who is being seen 10/15/2020 for the evaluation of chest pain at the request of Shawna Clamp, MD.  History of Present Illness:   No prior cardiac history.  Patient has had difficulties with infection in right foot.  He had surgery 2 days prior to admission.  He subsequently developed general malaise, fever, chills, diffuse aches including chest discomfort.  Chest was in the substernal area and back.  It increased with inspiration and certain movements.  No associated nausea, dyspnea or diaphoresis.  He was admitted to rule out pulmonary embolus and for further therapy.  Cardiology asked to evaluate.  Note his pain lasted for 3 days and still persists without completely resolving.   Past Medical History:  Diagnosis Date  . Acute osteomyelitis of right calcaneus ([place])    s/p debiidement and wound vac 08-28-2019  . Anemia of chronic renal failure, stage 3a ([place]) 09/10/2019   sees dr Kathaleen Maser 09-23-2019 on chart  . Charcot's joint of right ankle   . Chronic heel ulcer, right, limited to breakdown of skin ([place])    since march 2021, has wound vac since 10-27-2019 changed vac 10-12-2019  . DM type 1 (diabetes mellitus, type 1) ([place])   . Erythropoietin deficiency anemia 09/10/2019  . Hyperkalemia   . Hypertension   . Iron deficiency anemia due to chronic blood loss 09/10/2019  . Kidney disease   . Neuropathy    feet and toes  . Sleep apnea    cpap     Past Surgical History:  Procedure Laterality Date  . ANKLE SURGERY Right   . APPENDECTOMY  yrs ago  . GRAFT APPLICATION Right  [id number]   Procedure: GRAFT APPLICATION;  Surgeon: Evelina Bucy, DPM;  Location: [facility];  Service: Podiatry;  Laterality: Right;  . I & D EXTREMITY Right 10/16/2019   Procedure: IRRIGATION AND DEBRIDEMENT OF RIGHT HEEL WITH APPLICATION WOUND VAC;  Surgeon: Evelina Bucy, DPM;  Location: [place];  Service: Podiatry;  Laterality: Right;  . INCISION AND DRAINAGE OF WOUND Right 10/28/2019   Procedure: IRRIGATION AND DEBRIDEMENT WOUND RIGHT HEEL;  Surgeon: Evelina Bucy, DPM;  Location: [place];  Service: Podiatry;  Laterality: Right;  . IRRIGATION AND DEBRIDEMENT FOOT Right 08/28/2019   Procedure: RIght foot wound incision and drainage, debridement and irrigation, bone biopsy, application of wound VAC;  Surgeon: Evelina Bucy, DPM;  Location: [facility];  Service: Podiatry;  Laterality: Right;     Inpatient Medications: Scheduled Meds: . insulin pump   Subcutaneous TID WC, HS, 0200   Continuous Infusions: . sodium chloride 100 mL/hr at 10/15/20 0620  . heparin 1,500 Units/hr (10/15/20 1200)   PRN Meds: acetaminophen **OR** acetaminophen, ondansetron **OR** ondansetron (ZOFRAN) IV  Allergies:    Allergies  Allergen Reactions  . Lisinopril Cough  . Keflex [Cephalexin] Rash    Social History:   Social History   Socioeconomic History  . Marital status: Married    Spouse name: Emergency planning/management officer  . Number of children: 1  . Years of education: 62  .  Highest education level: High school graduate  Occupational History  . Not on file  Tobacco Use  . Smoking status: Never Smoker  . Smokeless tobacco: Never Used  Vaping Use  . Vaping Use: Never used  Substance and Sexual Activity  . Alcohol use: No  . Drug use: Never  . Sexual activity: Not on file  Other Topics Concern  . Not on file  Social History Narrative  . Not on file   Social Determinants of Health   Financial Resource Strain: Not on file  Food Insecurity: Not on  file  Transportation Needs: Not on file  Physical Activity: Not on file  Stress: Not on file  Social Connections: Not on file  Intimate Partner Violence: Not on file    Family History:    Family History  Problem Relation Age of Onset  . Heart failure Mother   . Stroke Mother   . Cancer Mother   . Cancer Father   . Heart failure Father      ROS:  Please see the history of present illness.  Fever, chills, general malaise, diffuse body aches. All other ROS reviewed and negative.     Physical Exam/Data:   Vitals:   10/15/20 0800 10/15/20 0830 10/15/20 0900 10/15/20 0944  BP: (!) 175/86 (!) 187/91 (!) 184/90 (!) 182/82  Pulse: 79 84 82 82  Resp: '14 11 17 '$ (!) 22  Temp:    99.2 F (37.3 C)  TempSrc:    Oral  SpO2: 91% 94% 94% 91%  Weight:    93 kg  Height:    '5\' 6"'$  (1.676 m)    Intake/Output Summary (Last 24 hours) at 10/15/2020 1356 Last data filed at 10/15/2020 1200 Gross per 24 hour  Intake 3240 ml  Output --  Net 3240 ml   Last 3 Weights 10/15/2020 10/14/2020 10/28/2019  Weight (lbs) 205 lb 1.6 oz 207 lb 193 lb 6.4 oz  Weight (kg) 93.033 kg 93.895 kg 87.726 kg     Body mass index is 33.1 kg/m.  General:  Well nourished, well developed, in no acute distress HEENT: normal Lymph: no adenopathy Neck: no JVD Endocrine:  No thryomegaly Vascular: No carotid bruits; FA pulses 2+ bilaterally without bruits  Cardiac:  normal S1, S2; RRR; no murmur  Lungs:  clear to auscultation bilaterally, no wheezing, rhonchi or rales  Abd: soft, nontender, no hepatomegaly  Ext: Right foot wrapped, warm to touch, trace edema bilaterally right greater than left. Musculoskeletal:  No deformities, BUE and BLE strength normal and equal Skin: warm and dry  Neuro:  CNs 2-12 intact, no focal abnormalities noted Psych:  Normal affect   EKG:  The EKG was personally reviewed and demonstrates: Normal sinus rhythm with no ST changes. Telemetry:  Telemetry was personally reviewed and  demonstrates: Sinus rhythm  Laboratory Data:  High Sensitivity Troponin:   Recent Labs  Lab 10/14/20 1654 10/14/20 1939 10/14/20 2152 10/15/20 0949  TROPONINIHS 145* 120* 102* 86*  86*     Chemistry Recent Labs  Lab 10/14/20 1654 10/15/20 0019 10/15/20 0949  NA 134* 137 137  K 3.8 3.9 3.9  CL 100 104 103  CO2 '26 26 24  '$ GLUCOSE 216* 283* 242*  BUN 34* 33* 32*  CREATININE 2.36* 2.48* 2.28*  CALCIUM 8.9 8.3* 8.6*  GFRNONAA 34* 32* 35*  ANIONGAP '8 7 10    '$ Hematology Recent Labs  Lab 10/14/20 1654 10/15/20 0949  WBC 18.6* 14.3*  14.6*  RBC 4.16* 3.72*  3.68*  HGB 11.1* 9.8*  9.8*  HCT 35.1* 31.5*  31.6*  MCV 84.4 84.7  85.9  MCH 26.7 26.3  26.6  MCHC 31.6 31.1  31.0  RDW 12.5 12.3  12.3  PLT 255 229  234    Radiology/Studies:  DG Chest 1 View  Result Date: 10/14/2020 CLINICAL DATA:  Chest tightness EXAM: CHEST  1 VIEW COMPARISON:  August 12, 2019 FINDINGS: The cardiomediastinal silhouette is normal in contour. No pleural effusion. No pneumothorax. No acute pleuroparenchymal abnormality. Visualized abdomen is unremarkable. No acute osseous abnormality noted. IMPRESSION: No acute cardiopulmonary abnormality. Electronically Signed   By: Valentino Saxon MD   On: 10/14/2020 14:37   DG Ankle Complete Right  Result Date: 10/14/2020 CLINICAL DATA:  Postop EXAM: RIGHT ANKLE - COMPLETE 3+ VIEW COMPARISON:  06/21/2020, CT 08/24/2020 FINDINGS: Interval resection of distal fibula with smooth appearing cut margin of the fibula. Interval surgical plate and multiple screw fixation of the tibia, talus, and calcaneus. Hardware appears grossly intact. Redemonstrated Charcot arthropathy of the midfoot with bony destructive change, fragmentation and collapse. Generalized soft tissue swelling. IMPRESSION: Interval operative changes of the left ankle and hindfoot including resection of distal fibula and placement of fixating plate and surgical screws extending from the distal  tibia to the calcaneus. Charcot arthropathy of the mid foot as before, though limits assessment for acute process. Electronically Signed   By: Donavan Foil M.D.   On: 10/14/2020 22:36   DG Foot Complete Right  Result Date: 10/14/2020 CLINICAL DATA:  Postop fever EXAM: RIGHT FOOT COMPLETE - 3+ VIEW COMPARISON:  CT 06/26/2020, radiograph 06/21/2020 FINDINGS: Soft tissue swelling is present. Charcot arthropathy of the midfoot. Vascular calcifications. Osseous demineralization without frank destructive change at the digits or metatarsals. IMPRESSION: No definite acute osseous abnormality involving the digits or metatarsals of the right foot Electronically Signed   By: Donavan Foil M.D.   On: 10/14/2020 22:37   ECHOCARDIOGRAM COMPLETE  Result Date: 10/15/2020    ECHOCARDIOGRAM REPORT   Patient Name:   Javier Hagner. Date of Exam: 10/15/2020 Medical Rec #:  [id number]            Height:       66.0 in Accession #:    [id number]           Weight:       205.1 lb Date of Birth:  12-07-1975             BSA:          2.021 m Patient Age:    45 years             BP:           182/82 mmHg Patient Gender: M                    HR:           81 bpm. Exam Location:  Inpatient Procedure: 2D Echo, Cardiac Doppler and Color Doppler Indications:    R07.9* Chest pain, unspecified  History:        Patient has prior history of Echocardiogram examinations, most                 recent 08/12/2019. Risk Factors:Hypertension, Diabetes and Sleep                 Apnea. CKD.  Sonographer:    Jonelle Sidle Dance Referring Phys: [id number] [place]  1. Left ventricular ejection  fraction, by estimation, is 55 to 60%. The left ventricle has normal function. The left ventricle has no regional wall motion abnormalities. There is mild left ventricular hypertrophy. Left ventricular diastolic parameters were normal.  2. Right ventricular systolic function is normal. The right ventricular size is normal. Tricuspid regurgitation signal is  inadequate for assessing PA pressure.  3. The mitral valve is grossly normal. Trivial mitral valve regurgitation.  4. The aortic valve is tricuspid. Aortic valve regurgitation is not visualized.  5. The inferior vena cava is normal in size with greater than 50% respiratory variability, suggesting right atrial pressure of 3 mmHg. FINDINGS  Left Ventricle: Left ventricular ejection fraction, by estimation, is 55 to 60%. The left ventricle has normal function. The left ventricle has no regional wall motion abnormalities. The left ventricular internal cavity size was normal in size. There is  mild left ventricular hypertrophy. Left ventricular diastolic parameters were normal. Right Ventricle: The right ventricular size is normal. No increase in right ventricular wall thickness. Right ventricular systolic function is normal. Tricuspid regurgitation signal is inadequate for assessing PA pressure. Left Atrium: Left atrial size was normal in size. Right Atrium: Right atrial size was normal in size. Pericardium: There is no evidence of pericardial effusion. Mitral Valve: The mitral valve is grossly normal. Trivial mitral valve regurgitation. Tricuspid Valve: The tricuspid valve is grossly normal. Tricuspid valve regurgitation is trivial. Aortic Valve: The aortic valve is tricuspid. Aortic valve regurgitation is not visualized. Pulmonic Valve: The pulmonic valve was grossly normal. Pulmonic valve regurgitation is trivial. Aorta: The aortic root is normal in size and structure. Venous: The inferior vena cava is normal in size with greater than 50% respiratory variability, suggesting right atrial pressure of 3 mmHg. IAS/Shunts: No atrial level shunt detected by color flow Doppler.  LEFT VENTRICLE PLAX 2D LVIDd:         4.90 cm  Diastology LVIDs:         3.30 cm  LV e' medial:    8.05 cm/s LV PW:         1.20 cm  LV E/e' medial:  11.8 LV IVS:        1.10 cm  LV e' lateral:   11.60 cm/s LVOT diam:     2.20 cm  LV E/e' lateral:  8.2 LV SV:         78 LV SV Index:   39 LVOT Area:     3.80 cm  RIGHT VENTRICLE             IVC RV Basal diam:  2.60 cm     IVC diam: 2.20 cm RV S prime:     22.10 cm/s TAPSE (M-mode): 2.8 cm LEFT ATRIUM             Index       RIGHT ATRIUM           Index LA diam:        4.20 cm 2.08 cm/m  RA Area:     13.00 cm LA Vol (A2C):   53.1 ml 26.27 ml/m RA Volume:   27.00 ml  13.36 ml/m LA Vol (A4C):   65.5 ml 32.40 ml/m LA Biplane Vol: 58.8 ml 29.09 ml/m  AORTIC VALVE LVOT Vmax:   94.20 cm/s LVOT Vmean:  68.100 cm/s LVOT VTI:    0.205 m  AORTA Ao Root diam: 3.20 cm Ao Asc diam:  2.90 cm MITRAL VALVE MV Area (PHT): 3.63 cm    SHUNTS MV  Decel Time: 209 msec    Systemic VTI:  0.20 m MV E velocity: 94.90 cm/s  Systemic Diam: 2.20 cm MV A velocity: 81.40 cm/s MV E/A ratio:  1.17 Rozann Lesches MD Electronically signed by Rozann Lesches MD Signature Date/Time: 10/15/2020/1:00:42 PM    Final    VAS [place] LOWER EXTREMITY VENOUS (DVT) (ONLY [facility] & [facility] 7a-7p)  Result Date: 10/14/2020  Lower Venous DVT Study Patient Name:  Javier Nellessen.  Date of Exam:   10/14/2020 Medical Rec #: [id number]             Accession #:    [id number] Date of Birth: [birth year]              Patient Gender: M Patient Age:   45Y Exam Location:  [facility] Procedure:      VAS [place] LOWER EXTREMITY VENOUS (DVT) Referring Phys: [id number] LAURA A MURPHY --------------------------------------------------------------------------------  Indications: RLE upper leg pain post ankle surgery.  Limitations: Poor ultrasound/tissue interface. Comparison Study: Previous exam 08/27/19 - negative Performing Technologist: Rogelia Rohrer  Examination Guidelines: A complete evaluation includes B-mode imaging, spectral Doppler, color Doppler, and power Doppler as needed of all accessible portions of each vessel. Bilateral testing is considered an integral part of a complete examination. Limited examinations for reoccurring indications may be performed as noted. The  reflux portion of the exam is performed with the patient in reverse Trendelenburg.  +---------+---------------+---------+-----------+----------+-------------------+ RIGHT    CompressibilityPhasicitySpontaneityPropertiesThrombus Aging      +---------+---------------+---------+-----------+----------+-------------------+ CFV      Full           Yes      Yes                                      +---------+---------------+---------+-----------+----------+-------------------+ SFJ      Full                                                             +---------+---------------+---------+-----------+----------+-------------------+ FV Prox  Full           Yes      Yes                                      +---------+---------------+---------+-----------+----------+-------------------+ FV Mid   Full           Yes      Yes                                      +---------+---------------+---------+-----------+----------+-------------------+ FV DistalFull           Yes      Yes                                      +---------+---------------+---------+-----------+----------+-------------------+ PFV      Full                                                             +---------+---------------+---------+-----------+----------+-------------------+  POP      Full           Yes      Yes                                      +---------+---------------+---------+-----------+----------+-------------------+ PTV      Full                                         Not well visualized +---------+---------------+---------+-----------+----------+-------------------+ PERO     Full                                         Not well visualized +---------+---------------+---------+-----------+----------+-------------------+   +----+---------------+---------+-----------+----------+--------------+ LEFTCompressibilityPhasicitySpontaneityPropertiesThrombus Aging  +----+---------------+---------+-----------+----------+--------------+ CFV Full           Yes      Yes                                 +----+---------------+---------+-----------+----------+--------------+    Summary: RIGHT: - There is no evidence of deep vein thrombosis in the lower extremity. - There is no evidence of superficial venous thrombosis.  - No cystic structure found in the popliteal fossa.  LEFT: - No evidence of common femoral vein obstruction.  *See table(s) above for measurements and observations. Electronically signed by Servando Snare MD on 10/14/2020 at 4:55:23 PM.    Final      Assessment and Plan:   1. Chest pain-symptoms are atypical.  They have been continuous for 3 days without completely resolving.  Increased with inspiration and certain movements.  Electrocardiogram shows no acute ST changes.  Echocardiogram shows normal LV function.  Troponin minimally elevated with no clear trend.  This is also in the setting of renal insufficiency.  I do not think this is consistent with cardiac etiology.  No further inpatient evaluation is recommended.  Given multiple risk factors including 40 years of diabetes mellitus and family history of coronary disease I will arrange a stress nuclear study and follow-up as an outpatient. Note VQ scan is pending. 2. Postoperative fever-status post surgery on right lower extremity.  Cultures are pending.  Further evaluation and management per primary service. 3. Diabetes mellitus-on insulin pump. 4. Hypertension-pressure is elevated.  Would resume preadmission medications and follow. 5. Hyperlipidemia-continue statin. 6. Chronic stage IIIa kidney disease-follow renal function while in house.  Follow-up nephrology.  Follow-up Cardiology will sign off.  We will arrange an outpatient [place] nuclear study once he recovers from recent infection.  He will follow-up with APP in 2 to 4 weeks after that.  If [place] nuclear study normal he will not need  long-term cardiology follow-up.  Continue preadmission medications at time of discharge.  Please call with questions.  For questions or updates, please contact [place] Please consult [email or web address] for contact info under    Signed, Kirk Ruths, MD  10/15/2020 1:56 PM

## 2020-10-15 NOTE — H&P (Addendum)
History and Physical    Javier Hunt. [id number] DOB: December 14, 1975 DOA: 10/14/2020  PCP: [name], MD  Patient coming from: Home  I have personally briefly reviewed patient's old medical records in [place]  Chief Complaint: Fever, chest discomfort, SOB  HPI: Javier Hunt. is a 45 y.o. male with medical history significant of DM1 on insulin pump, CKD 3-4.  Pt had surgery to R foot for Charcot's foot done by podiatry x2 days ago.  No known infection in foot at time of surgery nor active ulcer (on either foot).  Not felt well since that time.  C/o sore throat, some chest discomfort, some body aches.  BGLs running higher than normal, perhaps mild SOB.  Osteomyelitis to R calcaneous with S. Aureus (MSSA) back in May 2021 but this was believed to be resolved.  Taking clinda after surgery x2 days ago.  No abd pain, no leg swelling.   ED Course: [place] RLE neg for DVT.  Tm 100.4  Wbc 18.6K.  Trops 145, 120, and 102.  Concern for PE, but Creat 2.36 (was 2.0 in Feb 2022 at [facility] office).  Pt put on empiric heparin gtt and hospitalist asked to admit for VQ scan.   Review of Systems: As per HPI, otherwise all review of systems negative.  Past Medical History:  Diagnosis Date  . Acute osteomyelitis of right calcaneus ([place])    s/p debiidement and wound vac 08-28-2019  . Anemia of chronic renal failure, stage 3a ([place]) 09/10/2019   sees dr Kathaleen Maser 09-23-2019 on chart  . Charcot's joint of right ankle   . Chronic heel ulcer, right, limited to breakdown of skin ([place])    since march 2021, has wound vac since 10-27-2019 changed vac 10-12-2019  . DM type 1 (diabetes mellitus, type 1) ([place])   . Erythropoietin deficiency anemia 09/10/2019  . Hyperkalemia   . Hypertension   . Iron deficiency anemia due to chronic blood loss 09/10/2019  . Kidney disease   . Neuropathy    feet and toes  . Sleep apnea    cpap     Past Surgical History:  Procedure  Laterality Date  . ANKLE SURGERY Right   . APPENDECTOMY  yrs ago  . GRAFT APPLICATION Right [id number]   Procedure: GRAFT APPLICATION;  Surgeon: Evelina Bucy, DPM;  Location: [facility];  Service: Podiatry;  Laterality: Right;  . I & D EXTREMITY Right 10/16/2019   Procedure: IRRIGATION AND DEBRIDEMENT OF RIGHT HEEL WITH APPLICATION WOUND VAC;  Surgeon: Evelina Bucy, DPM;  Location: [facility];  Service: Podiatry;  Laterality: Right;  . INCISION AND DRAINAGE OF WOUND Right 10/28/2019   Procedure: IRRIGATION AND DEBRIDEMENT WOUND RIGHT HEEL;  Surgeon: Evelina Bucy, DPM;  Location: [facility];  Service: Podiatry;  Laterality: Right;  . IRRIGATION AND DEBRIDEMENT FOOT Right 08/28/2019   Procedure: RIght foot wound incision and drainage, debridement and irrigation, bone biopsy, application of wound VAC;  Surgeon: Evelina Bucy, DPM;  Location: [facility];  Service: Podiatry;  Laterality: Right;     reports that he has never smoked. He has never used smokeless tobacco. He reports that he does not drink alcohol and does not use drugs.  Allergies  Allergen Reactions  . Lisinopril Cough  . Keflex [Cephalexin] Rash    Family History  Problem Relation Age of Onset  . Heart failure Mother   . Stroke Mother   .  Cancer Mother   . Cancer Father   . Heart failure Father      Prior to Admission medications   Medication Sig Start Date End Date Taking? Authorizing Provider  clindamycin (CLEOCIN) 300 MG capsule Take 1 capsule (300 mg total) by mouth 2 (two) times daily. 10/12/20  Yes Evelina Bucy, DPM  ferrous sulfate 325 (65 FE) MG tablet Take 1 tablet (325 mg total) by mouth 2 (two) times daily with a meal. Patient taking differently: Take 325 mg by mouth daily with breakfast. 08/12/19  Yes Georgette Shell, MD  oxyCODONE (ROXICODONE) 5 MG immediate release tablet Take 2 tablets (10 mg total) by mouth every 4 (four) hours as needed for  severe pain. 10/12/20  Yes Evelina Bucy, DPM  aspirin 81 MG tablet Take 81 mg by mouth daily.    [provider]  atorvastatin (LIPITOR) 40 MG tablet Take 40 mg by mouth daily at 6 PM.  08/11/17   [provider]  becaplermin (REGRANEX) 0.01 % gel Apply 1 application topically daily. 01/19/20   Evelina Bucy, DPM  CONTOUR NEXT TEST test strip USE 8 TIMES DAILY AND AS DIRECTED 05/16/17   [provider]  ezetimibe (ZETIA) 10 MG tablet Take 10 mg by mouth daily. 07/14/19   [provider]  furosemide (LASIX) 20 MG tablet 1 tablet    [provider]  HUMALOG 100 UNIT/ML injection SMARTSIG:0-100 Unit(s) SUB-Q Daily 02/11/20   [provider]  insulin aspart (NOVOLOG) 100 UNIT/ML injection 100U/DAY VIA PUMP SUBCUTANEOUSLY 03/02/19   [provider]  Insulin Human (INSULIN PUMP) SOLN Inject into the skin. Novolog. Base: 25.45 units per day.  1 unit per 10 grams of carbohydrates.  Basal rate varies usually 0.95    [provider]  metoprolol succinate (TOPROL-XL) 50 MG 24 hr tablet Take 50 mg by mouth daily. Take with or immediately following a meal.    [provider]  metoprolol tartrate (LOPRESSOR) 25 MG tablet Take 25 mg by mouth daily. 12/23/19   [provider]  olmesartan (BENICAR) 20 MG tablet Take 20 mg by mouth daily. 09/08/20   [provider]  olmesartan (BENICAR) 5 MG tablet Take 10 mg by mouth daily. 07/14/19   [provider]  omeprazole (PRILOSEC) 40 MG capsule Take 40 mg by mouth daily. 06/22/17   [provider]  oxyCODONE-acetaminophen (PERCOCET) 5-325 MG tablet Take 1 tablet by mouth every 4 (four) hours as needed for severe pain. 10/28/19   Evelina Bucy, DPM  silver sulfADIAZINE (SILVADENE) 1 % cream Apply pea-sized amount to wound daily. 03/29/20   Evelina Bucy, DPM  tadalafil (CIALIS) 20 MG tablet Take 20 mg by mouth daily as needed for erectile dysfunction.   05/15/17   [provider]  Vitamin D, Ergocalciferol, (DRISDOL) 50000 UNITS CAPS capsule Take 50,000 Units by mouth 2 (two) times a week. Monday and Wednesday    [provider]    Physical Exam: Vitals:   10/15/20 0430 10/15/20 0500 10/15/20 0600 10/15/20 0630  BP: (!) 181/91 (!) 178/85 (!) 165/92 (!) 172/97  Pulse: 83 79 80 85  Resp: '16 18 18 20  '$ Temp:      TempSrc:      SpO2: 97% 94% 91% 96%  Weight:      Height:        Constitutional: NAD, calm, comfortable Eyes: PERRL, lids and conjunctivae normal ENMT: Mucous membranes are moist. Posterior pharynx clear of any  exudate or lesions.Normal dentition.  Neck: normal, supple, no masses, no thyromegaly Respiratory: clear to auscultation bilaterally, no wheezing, no crackles. Normal respiratory effort. No accessory muscle use.  Cardiovascular: Regular rate and rhythm, no murmurs / rubs / gallops. No extremity edema. 2+ pedal pulses. No carotid bruits.  Abdomen: no tenderness, no masses palpated. No hepatosplenomegaly. Bowel sounds positive.  Musculoskeletal: no clubbing / cyanosis. No joint deformity upper and lower extremities. Good ROM, no contractures. Normal muscle tone.  Skin: Foot under dressing, dressing C/D/I Neurologic: CN 2-12 grossly intact. Sensation intact, DTR normal. Strength 5/5 in all 4.  Psychiatric: Normal judgment and insight. Alert and oriented x 3. Normal mood.    Labs on Admission: I have personally reviewed following labs and imaging studies  CBC: Recent Labs  Lab 10/14/20 1654  WBC 18.6*  HGB 11.1*  HCT 35.1*  MCV 84.4  PLT 123456   Basic Metabolic Panel: Recent Labs  Lab 10/14/20 1654 10/15/20 0019  NA 134* 137  K 3.8 3.9  CL 100 104  CO2 26 26  GLUCOSE 216* 283*  BUN 34* 33*  CREATININE 2.36* 2.48*  CALCIUM 8.9 8.3*   GFR: Estimated Creatinine Clearance: 39.9 mL/min (A) (by C-G formula based on SCr of 2.48 mg/dL (H)). Liver Function Tests: No results for input(s): AST,  ALT, ALKPHOS, BILITOT, PROT, ALBUMIN in the last 168 hours. No results for input(s): LIPASE, AMYLASE in the last 168 hours. No results for input(s): AMMONIA in the last 168 hours. Coagulation Profile: Recent Labs  Lab 10/15/20 0203  INR 1.2   Cardiac Enzymes: No results for input(s): CKTOTAL, CKMB, CKMBINDEX, TROPONINI in the last 168 hours. BNP (last 3 results) No results for input(s): PROBNP in the last 8760 hours. HbA1C: No results for input(s): HGBA1C in the last 72 hours. CBG: No results for input(s): GLUCAP in the last 168 hours. Lipid Profile: No results for input(s): CHOL, HDL, LDLCALC, TRIG, CHOLHDL, LDLDIRECT in the last 72 hours. Thyroid Function Tests: No results for input(s): TSH, T4TOTAL, FREET4, T3FREE, THYROIDAB in the last 72 hours. Anemia Panel: No results for input(s): VITAMINB12, FOLATE, FERRITIN, TIBC, IRON, RETICCTPCT in the last 72 hours. Urine analysis:    Component Value Date/Time   COLORURINE STRAW (A) 08/11/2019 2030   APPEARANCEUR CLEAR 08/11/2019 2030   LABSPEC 1.008 08/11/2019 2030   [place] 5.0 08/11/2019 2030   GLUCOSEU 150 (A) 08/11/2019 2030   HGBUR SMALL (A) 08/11/2019 2030   [place] NEGATIVE 08/11/2019 2030   [place] 08/11/2019 2030   PROTEINUR 100 (A) 08/11/2019 2030   NITRITE NEGATIVE 08/11/2019 2030   LEUKOCYTESUR NEGATIVE 08/11/2019 2030    Radiological Exams on Admission: DG Chest 1 View  Result Date: 10/14/2020 CLINICAL DATA:  Chest tightness EXAM: CHEST  1 VIEW COMPARISON:  August 12, 2019 FINDINGS: The cardiomediastinal silhouette is normal in contour. No pleural effusion. No pneumothorax. No acute pleuroparenchymal abnormality. Visualized abdomen is unremarkable. No acute osseous abnormality noted. IMPRESSION: No acute cardiopulmonary abnormality. Electronically Signed   By: Valentino Saxon MD   On: 10/14/2020 14:37   DG Ankle Complete Right  Result Date: 10/14/2020 CLINICAL DATA:  Postop EXAM: RIGHT ANKLE -  COMPLETE 3+ VIEW COMPARISON:  06/21/2020, CT 08/24/2020 FINDINGS: Interval resection of distal fibula with smooth appearing cut margin of the fibula. Interval surgical plate and multiple screw fixation of the tibia, talus, and calcaneus. Hardware appears grossly intact. Redemonstrated Charcot arthropathy of the midfoot with bony destructive change, fragmentation and collapse. Generalized soft tissue swelling.  IMPRESSION: Interval operative changes of the left ankle and hindfoot including resection of distal fibula and placement of fixating plate and surgical screws extending from the distal tibia to the calcaneus. Charcot arthropathy of the mid foot as before, though limits assessment for acute process. Electronically Signed   By: Donavan Foil M.D.   On: 10/14/2020 22:36   DG Foot Complete Right  Result Date: 10/14/2020 CLINICAL DATA:  Postop fever EXAM: RIGHT FOOT COMPLETE - 3+ VIEW COMPARISON:  CT 06/26/2020, radiograph 06/21/2020 FINDINGS: Soft tissue swelling is present. Charcot arthropathy of the midfoot. Vascular calcifications. Osseous demineralization without frank destructive change at the digits or metatarsals. IMPRESSION: No definite acute osseous abnormality involving the digits or metatarsals of the right foot Electronically Signed   By: Donavan Foil M.D.   On: 10/14/2020 22:37   VAS [place] LOWER EXTREMITY VENOUS (DVT) (ONLY [facility] & [facility] 7a-7p)  Result Date: 10/14/2020  Lower Venous DVT Study Patient Name:  Aarick Douge.  Date of Exam:   10/14/2020 Medical Rec #: [id number]             Accession #:    [id number] Date of Birth: 10-Aug-1975              Patient Gender: M Patient Age:   45Y Exam Location[facility]  [facility] Procedure:      VAS [place] LOWER EXTREMITY VENOUS (DVT) Referring Phys: [id number] LAURA A MURPHY --------------------------------------------------------------------------------  Indications: RLE upper leg pain post ankle surgery.  Limitations: Poor ultrasound/tissue interface.  Comparison Study: Previous exam 08/27/19 - negative Performing Technologist: Rogelia Rohrer  Examination Guidelines: A complete evaluation includes B-mode imaging, spectral Doppler, color Doppler, and power Doppler as needed of all accessible portions of each vessel. Bilateral testing is considered an integral part of a complete examination. Limited examinations for reoccurring indications may be performed as noted. The reflux portion of the exam is performed with the patient in reverse Trendelenburg.  +---------+---------------+---------+-----------+----------+-------------------+ RIGHT    CompressibilityPhasicitySpontaneityPropertiesThrombus Aging      +---------+---------------+---------+-----------+----------+-------------------+ CFV      Full           Yes      Yes                                      +---------+---------------+---------+-----------+----------+-------------------+ SFJ      Full                                                             +---------+---------------+---------+-----------+----------+-------------------+ FV Prox  Full           Yes      Yes                                      +---------+---------------+---------+-----------+----------+-------------------+ FV Mid   Full           Yes      Yes                                      +---------+---------------+---------+-----------+----------+-------------------+ FV DistalFull  Yes      Yes                                      +---------+---------------+---------+-----------+----------+-------------------+ PFV      Full                                                             +---------+---------------+---------+-----------+----------+-------------------+ POP      Full           Yes      Yes                                      +---------+---------------+---------+-----------+----------+-------------------+ PTV      Full                                         Not well  visualized +---------+---------------+---------+-----------+----------+-------------------+ PERO     Full                                         Not well visualized +---------+---------------+---------+-----------+----------+-------------------+   +----+---------------+---------+-----------+----------+--------------+ LEFTCompressibilityPhasicitySpontaneityPropertiesThrombus Aging +----+---------------+---------+-----------+----------+--------------+ CFV Full           Yes      Yes                                 +----+---------------+---------+-----------+----------+--------------+    Summary: RIGHT: - There is no evidence of deep vein thrombosis in the lower extremity. - There is no evidence of superficial venous thrombosis.  - No cystic structure found in the popliteal fossa.  LEFT: - No evidence of common femoral vein obstruction.  *See table(s) above for measurements and observations. Electronically signed by Servando Snare MD on 10/14/2020 at 4:55:23 PM.    Final     EKG: Independently reviewed.  Assessment/Plan Principal Problem:   Chest pressure Active Problems:   CKD (chronic kidney disease) stage 4, GFR 15-29 ml/min (HCC)   Essential hypertension   Presence of insulin pump (external) (internal)   Type 1 diabetes mellitus with foot ulcer (HCC)   Fever postop    1. Chest pressure - 1. With elevated trop though down trending 2. DDx includes PE vs demand ischemia 3. CXR neg for PNA findings 4. Tele monitor 5. Cont heparin gtt for the moment 6. Get VQ scan this AM for PE 2. Post op fever - 1. WBC 18k 2. DDx includes Fever due to PE vs occult infection 3. Getting BCx 4. Holding off on ABx for the moment 5. Check procalcitonin 6. COVID and flu neg 7. CXR neg for PNA 8. Check UA 3. DM1 with prior foot ulcers, charcot joint - 1. Insulin pump order set 4. HTN - 1. Cont home BP meds when med rec completed 5. CKD 3-4 - 1. Creat slightly worse today than Feb, was  2.0 then.  Looks like pt has ongoing down trend in  GFR. 2. Dont think he has AKI today though  DVT prophylaxis: Heparin Gtt Code Status: Full Family Communication: Family at bedside Disposition Plan: Home after workup and treatment Consults called: None Admission status: Place in 51    Yen Wandell, [facility]  How to contact the [facility] Attending or Consulting provider [place] or covering provider during after hours [place], for this patient?  1. Check the care team in [facility] and look for a) attending/consulting [facility] provider listed and b) the [facility] team listed 2. Log into [email or web address]  Amion Physician Scheduling and messaging for groups and whole hospitals  On call and physician scheduling software for group practices, residents, hospitalists and other medical providers for call, clinic, rotation and shift schedules. OnCall [employer] is a hospital-wide system for scheduling doctors and paging doctors on call. EasyPlot is for scientific plotting and data analysis.  [email or web address]  and use [place]'s universal password to access. If you do not have the password, please contact the hospital operator.  3. Locate the [facility] provider you are looking for under [facility] and page to a number that you can be directly reached. 4. If you still have difficulty reaching the provider, please page the [facility] (Director on Call) for the Hospitalists listed on amion for assistance.  10/15/2020, 6:50 AM

## 2020-10-15 NOTE — Progress Notes (Signed)
  Echocardiogram 2D Echocardiogram has been performed.  Javier Hunt 10/15/2020, 12:45 PM

## 2020-10-15 NOTE — Progress Notes (Addendum)
ANTICOAGULATION CONSULT NOTE - Follow Up Consult  Pharmacy Consult for heparin Indication: r/o pulmonary embolus  Allergies  Allergen Reactions  . Lisinopril Cough  . Keflex [Cephalexin] Rash    Patient Measurements: Height: '5\' 6"'$  (167.6 cm) Weight: 93 kg (205 lb 1.6 oz) IBW/kg (Calculated) : 63.8 Heparin Dosing Weight: 84 kg  Vital Signs: Temp: 99.2 F (37.3 C) (05/14 0944) Temp Source: Oral (05/14 0944) BP: 182/82 (05/14 0944) Pulse Rate: 82 (05/14 0944)  Labs: Recent Labs    10/14/20 1654 10/14/20 1939 10/14/20 2152 10/15/20 0019 10/15/20 0203 10/15/20 0949  HGB 11.1*  --   --   --   --  9.8*  9.8*  HCT 35.1*  --   --   --   --  31.5*  31.6*  PLT 255  --   --   --   --  229  234  APTT  --   --   --   --  34  --   LABPROT  --   --   --   --  14.8  --   INR  --   --   --   --  1.2  --   HEPARINUNFRC  --   --   --   --   --  0.59  CREATININE 2.36*  --   --  2.48*  --  2.28*  TROPONINIHS 145* 120* 102*  --   --  86*  86*    Estimated Creatinine Clearance: 43.2 mL/min (A) (by C-G formula based on SCr of 2.28 mg/dL (H)).   Assessment: Patient is a 45 y.o M with hx osteomyelitis of the right calcaneus and Charcot's joint of right ankle with foot surgery 2 days prior to adm, presented to the ED on 5/13 with c/o right leg swelling, CP and SOB. LE doppler on 5/13 came back neg for DVT.  V/Q scan ordered for PE workup.  He's currently on heparin drip for r/o PE.  Today, 10/15/2020: - first heparin level is therapeutic at 0.59 - hgb 9.8, plts ok - scr 2.28 (crcl~43) - no bleeding documented   Goal of Therapy:  Heparin level 0.3-0.7 units/ml Monitor platelets by anticoagulation protocol: Yes   Plan:  - continue heparin drip at 1500 units/hr - will recheck another heparin level at 4p to ensure level is still therapeutic before changing to daily monitoring - monitor for s/sx bleeding - f/u V/Q scan result  Verla Bryngelson P 10/15/2020,11:11  AM  _______________________________________  Adden: Confirmatory heparin level at 4p now back therapeutic at 0.50.  Will continue with current heparin drip at 1500 units/hr. Daily heparin level starting on 5/15.  Dia Sitter, PharmD, BCPS 10/15/2020 4:54 PM

## 2020-10-15 NOTE — Progress Notes (Signed)
ANTICOAGULATION CONSULT NOTE - Initial Consult  Pharmacy Consult for Heparin Indication: r/o pulmonary embolus  Allergies  Allergen Reactions  . Lisinopril Cough  . Keflex [Cephalexin] Rash    Patient Measurements: Height: '5\' 6"'$  (167.6 cm) Weight: 93.9 kg (207 lb) IBW/kg (Calculated) : 63.8 Heparin Dosing Weight: 84  Vital Signs: Temp: 99.9 F (37.7 C) (05/13 2305) Temp Source: Oral (05/13 2305) BP: 157/89 (05/14 0130) Pulse Rate: 81 (05/14 0130)  Labs: Recent Labs    10/14/20 1654 10/14/20 1939 10/14/20 2152 10/15/20 0019  HGB 11.1*  --   --   --   HCT 35.1*  --   --   --   PLT 255  --   --   --   CREATININE 2.36*  --   --  2.48*  TROPONINIHS 145* 120* 102*  --     Estimated Creatinine Clearance: 39.9 mL/min (A) (by C-G formula based on SCr of 2.48 mg/dL (H)).   Medical History: Past Medical History:  Diagnosis Date  . Acute osteomyelitis of right calcaneus ([place])    s/p debiidement and wound vac 08-28-2019  . Anemia of chronic renal failure, stage 3a ([place]) 09/10/2019   sees dr Kathaleen Maser 09-23-2019 on chart  . Charcot's joint of right ankle   . Chronic heel ulcer, right, limited to breakdown of skin ([place])    since march 2021, has wound vac since 10-27-2019 changed vac 10-12-2019  . DM type 1 (diabetes mellitus, type 1) ([place])   . Erythropoietin deficiency anemia 09/10/2019  . Hyperkalemia   . Hypertension   . Iron deficiency anemia due to chronic blood loss 09/10/2019  . Kidney disease   . Neuropathy    feet and toes  . Sleep apnea    cpap     Medications:  No oral anticoagulation PTA  Assessment:  45 yr male with chest pain, back pain and hyperglycemia  Recent foot surgery 2 days ago  PMH HTN, DM, kidney disease, iron deficiency anemia  V/Q scan ordered  Pharmacy consulted to dose IV heparin for possible PE  Goal of Therapy:  Heparin level 0.3-0.7 units/ml Monitor platelets by anticoagulation protocol: Yes   Plan:  Dosing per [place]  nomogram: Heparin 3000 unit IV bolus x 1 followed by heparin gtt @ 1500 units/hr Check heparin level 8 hr after heparin started Monitor daily CBC and heparin level Monitor for signs & symptoms of bleeding  Madgeline Rayo, Toribio Harbour, PharmD 10/15/2020,2:11 AM

## 2020-10-15 NOTE — ED Notes (Signed)
[place], [phone], left voicemail about patient needing V/Q scan.

## 2020-10-15 NOTE — ED Provider Notes (Signed)
Care assumed from Dr. Rogene Houston.  Type I diabetic who is status post right foot surgery 2 days ago.  Here with generalized weakness, fever, chest discomfort and shortness of breath.  Concern for pulmonary embolism.  Troponins minimally elevated.  Creatinine worse than baseline.  Creatinine 2.48 with GFR 32.  Discussed with CT tech who states cannot receive IV contrast.  Will initiate IV heparin with concern for possible pulmonary embolism.  EKG without acute ischemia.  Troponin minimally positive but flat  Patient remains stable.  No increased work of breathing.  No hypoxia.  Heparin is started for possible pulmonary embolism. CT contrast is not available and VQ scan will be obtained.  Case discussed with Dr. Alcario Drought .Critical Care Performed by: Ezequiel Essex, MD Authorized by: Ezequiel Essex, MD   Critical care provider statement:    Critical care time (minutes):  45   Critical care was necessary to treat or prevent imminent or life-threatening deterioration of the following conditions: concern for pulmonary embolism.   Critical care was time spent personally by me on the following activities:  Discussions with consultants, evaluation of patient's response to treatment, examination of patient, ordering and performing treatments and interventions, ordering and review of laboratory studies, ordering and review of radiographic studies, pulse oximetry, re-evaluation of patient's condition, obtaining history from patient or surrogate and review of old charts      Javier Hunt, Javier Main, MD 10/15/20 [phone]

## 2020-10-15 NOTE — Care Plan (Addendum)
This 45 years old male with PMH significant for DM 1 on insulin pump, CKD stage III-IV, presented in the ED with chest pain associated with pressure.  Patient had right foot surgery for charcoaled foot done by podiatry 2 days ago.  There was no known infection in the foot or any active ulcer at the time of surgery.  Patient was found to be febrile in the ER with leukocytosis.  Right lower extremity negative for DVT.  Patient is found to have slightly elevated troponin which is trending down.  He is found to have a serum creatinine 2.36 slightly elevated from baseline. Patient is admitted for chest pain, postoperative fever.  Cardiology was consulted recommended elevated troponin could be demand ischemia,  no further cardiac work-up needed.  Echocardiogram unremarkable. VQ scan pending for PE. Patient is on heparin until to rule out PE.Marland Kitchen  Patient was seen and examined.  He reports chest pain happens when he takes deep breath.

## 2020-10-16 DIAGNOSIS — R0789 Other chest pain: Secondary | ICD-10-CM | POA: Diagnosis not present

## 2020-10-16 LAB — GLUCOSE, CAPILLARY
Glucose-Capillary: 237 mg/dL — ABNORMAL HIGH (ref 70–99)
Glucose-Capillary: 210 mg/dL — ABNORMAL HIGH (ref 70–99)
Glucose-Capillary: 189 mg/dL — ABNORMAL HIGH (ref 70–99)

## 2020-10-16 LAB — BASIC METABOLIC PANEL
Anion gap: 4 — ABNORMAL LOW (ref 5–15)
BUN: 26 mg/dL — ABNORMAL HIGH (ref 6–20)
CO2: 26 mmol/L (ref 22–32)
Calcium: 8.5 mg/dL — ABNORMAL LOW (ref 8.9–10.3)
Chloride: 107 mmol/L (ref 98–111)
Creatinine, Ser: 2.23 mg/dL — ABNORMAL HIGH (ref 0.61–1.24)
GFR, Estimated: 36 mL/min — ABNORMAL LOW (ref >60)
Glucose, Bld: 237 mg/dL — ABNORMAL HIGH (ref 70–99)
Potassium: 3.6 mmol/L (ref 3.5–5.1)
Sodium: 137 mmol/L (ref 135–145)

## 2020-10-16 LAB — HIV ANTIBODY (ROUTINE TESTING W REFLEX): HIV Screen 4th Generation wRfx: NONREACTIVE

## 2020-10-16 LAB — CBC
HCT: 29.2 % — ABNORMAL LOW (ref 39.0–52.0)
Hemoglobin: 9.3 g/dL — ABNORMAL LOW (ref 13.0–17.0)
MCH: 26.4 pg (ref 26.0–34.0)
MCHC: 31.8 g/dL (ref 30.0–36.0)
MCV: 83 fL (ref 80.0–100.0)
Platelets: 256 10*3/uL (ref 150–400)
RBC: 3.52 MIL/uL — ABNORMAL LOW (ref 4.22–5.81)
RDW: 12.1 % (ref 11.5–15.5)
WBC: 11.5 10*3/uL — ABNORMAL HIGH (ref 4.0–10.5)
nRBC: 0 % (ref 0.0–0.2)

## 2020-10-16 LAB — HEPARIN LEVEL (UNFRACTIONATED): Heparin Unfractionated: 0.46 IU/mL (ref 0.30–0.70)

## 2020-10-16 LAB — MAGNESIUM: Magnesium: 1.8 mg/dL (ref 1.7–2.4)

## 2020-10-16 LAB — PHOSPHORUS: Phosphorus: 2.1 mg/dL — ABNORMAL LOW (ref 2.5–4.6)

## 2020-10-16 MED ORDER — K PHOS MONO-SOD PHOS DI & MONO 155-852-130 MG PO TABS
250.0000 mg | ORAL_TABLET | Freq: Three times a day (TID) | ORAL | Status: DC
  Administered 2020-10-16: 250 mg via ORAL
  Filled 2020-10-16 (×3): qty 1

## 2020-10-16 MED ORDER — HYDRALAZINE HCL 25 MG PO TABS
25.0000 mg | ORAL_TABLET | Freq: Three times a day (TID) | ORAL | Status: DC
  Administered 2020-10-16: 25 mg via ORAL
  Filled 2020-10-16: qty 1

## 2020-10-16 MED ORDER — HYDRALAZINE HCL 25 MG PO TABS: 25.0000 mg | ORAL_TABLET | Freq: Three times a day (TID) | ORAL | 0 refills | Status: DC

## 2020-10-16 NOTE — Discharge Instructions (Signed)
Advised to follow-up with primary care physician in 1 week. Advised to follow-up with nephrology Dr. [name] as scheduled. Advised to take hydralazine 25 mg 3 times daily for blood pressure control. Benicar has been discontinued due to slightly elevated creatinine. Advised to follow-up with podiatry as scheduled.

## 2020-10-16 NOTE — Progress Notes (Signed)
Pt d/c home d/c instruction given. No changes in initial am assessment at this time. Condition sable denies c/p. Stable fro d/c

## 2020-10-16 NOTE — Discharge Summary (Signed)
Physician Discharge Summary  Javier Hunt. [id number] DOB: July 24, 1975 DOA: 10/14/2020  PCP: Javier Gravel, MD  Admit date: 10/14/2020   Discharge date: 10/16/2020  Admitted From: Home. Disposition:  Home.  Recommendations for Outpatient Follow-up:  1. Follow up with PCP in 1-2 weeks. 2. Please obtain BMP/CBC in one week. 3. Advised to follow-up with Nephrology Dr. [name] as scheduled. 4. Advised to take hydralazine 25 mg 3 times daily for blood pressure control. 5. Benicar has been discontinued due to slightly elevated creatinine. 6. Advised to follow-up with podiatry as scheduled. 7. Advised to follow up Dr. [name] for outpatient nuclear stress test.  Home Health: NONE Equipment/Devices: None Discharge Condition: Stable CODE STATUS:Full code Diet recommendation: Heart Healthy   Brief Summary / Hospital course: This 45 years old male with PMH significant for DM type I on insulin pump, CKD stage III-IV, presented in the ED with chest pain associated with pressure. Patient had right foot surgery for charcot  foot done by podiatry 2 days ago. There was no known infection in the foot or any active ulcer at the time of surgery.  Patient reports having chest pressure which gets worse on deep inspiration and with certain body movements. Patient was found to be febrile in the ER with leukocytosis.  Right lower extremity venous duplex negative for DVT.  Patient is found to have slightly elevated troponin which is trending down.  He is found to have a serum creatinine 2.36 slightly elevated from baseline. Patient was admitted for chest pain, postoperative fever.  Cardiology was consulted recommended elevated troponin could be demand ischemia,  no further cardiac work-up needed while inpatient.  Echocardiogram unremarkable.  Patient may need outpatient nuclear stress test after discharge. VQ scan negative for pulmonary embolism.   Patient was started on heparin and was discontinued once V/Q  negative.  Patient feels better reports chest pain has improved and wants to be discharged.  Patient's Benicar was discontinued and started on hydralazine 25 mg 3 times daily for better blood pressure control.  Patient will follow up with primary care physician who will titrate up blood pressure medications.  Discharge Diagnoses:  Principal Problem:   Chest pressure Active Problems:   CKD (chronic kidney disease) stage 4, GFR 15-29 ml/min (HCC)   Essential hypertension   Presence of insulin pump (external) (internal)   Type 1 diabetes mellitus with foot ulcer (HCC)   Fever postop   Discharge Instructions  Discharge Instructions    Call MD for:  difficulty breathing, headache or visual disturbances   Complete by: As directed    Call MD for:  persistant dizziness or light-headedness   Complete by: As directed    Call MD for:  persistant nausea and vomiting   Complete by: As directed    Call MD for:  severe uncontrolled pain   Complete by: As directed    Diet - low sodium heart healthy   Complete by: As directed    Diet Carb Modified   Complete by: As directed    Discharge instructions   Complete by: As directed    Advised to follow-up with primary care physician in 1 week. Advised to follow-up with nephrology Dr. [name] as scheduled. Advised to take hydralazine 25 mg 3 times daily for blood pressure control. Benicar has been discontinued due to slightly elevated creatinine. Advised to follow-up with podiatry as scheduled.   Increase activity slowly   Complete by: As directed      Allergies as of 10/16/2020  Reactions   Lisinopril Cough   Keflex [cephalexin] Rash      Medication List    STOP taking these medications   olmesartan 20 MG tablet Commonly known as: BENICAR   oxyCODONE-acetaminophen 5-325 MG tablet Commonly known as: Percocet   Regranex 0.01 % gel Generic drug: becaplermin   silver sulfADIAZINE 1 % cream Commonly known as: Silvadene     TAKE  these medications   acetaminophen 500 MG tablet Commonly known as: TYLENOL Take 500 mg by mouth every 6 (six) hours as needed for moderate pain.   aspirin 81 MG tablet Take 81 mg by mouth daily.   atorvastatin 40 MG tablet Commonly known as: LIPITOR Take 40 mg by mouth daily at 6 PM.   clindamycin 300 MG capsule Commonly known as: Cleocin Take 1 capsule (300 mg total) by mouth 2 (two) times daily.   Contour Next Test test strip Generic drug: glucose blood USE 8 TIMES DAILY AND AS DIRECTED   ezetimibe 10 MG tablet Commonly known as: ZETIA Take 10 mg by mouth daily.   ferrous sulfate 325 (65 FE) MG tablet Take 1 tablet (325 mg total) by mouth 2 (two) times daily with a meal. What changed: when to take this   hydrALAZINE 25 MG tablet Commonly known as: APRESOLINE Take 1 tablet (25 mg total) by mouth every 8 (eight) hours.   insulin pump Soln Inject into the skin. Humalog. Base: 25.45 units per day.  1 unit per 10 grams of carbohydrates.  Basal rate varies usually 0.95 What changed: Another medication with the same name was removed. Continue taking this medication, and follow the directions you see here.   metoprolol succinate 100 MG 24 hr tablet Commonly known as: TOPROL-XL Take 100 mg by mouth daily.   omeprazole 40 MG capsule Commonly known as: PRILOSEC Take 40 mg by mouth daily.   oxyCODONE 5 MG immediate release tablet Commonly known as: Roxicodone Take 2 tablets (10 mg total) by mouth every 4 (four) hours as needed for severe pain.   Vitamin D (Ergocalciferol) 1.25 MG (50000 UNIT) Caps capsule Commonly known as: DRISDOL Take 50,000 Units by mouth 2 (two) times a week. Monday and Wednesday       Follow-up Information    Javier Gravel, MD Follow up in 1 week(s).   Specialty: Internal Medicine Contact information: [place] [address] [phone]        [place], [place] Follow up in 1 week(s).   Contact information: [place] [address] [phone]              Allergies  Allergen Reactions  . Lisinopril Cough  . Keflex [Cephalexin] Rash    Consultations:  Cardiology   Procedures/Studies: DG Chest 1 View  Result Date: 10/14/2020 CLINICAL DATA:  Chest tightness EXAM: CHEST  1 VIEW COMPARISON:  August 12, 2019 FINDINGS: The cardiomediastinal silhouette is normal in contour. No pleural effusion. No pneumothorax. No acute pleuroparenchymal abnormality. Visualized abdomen is unremarkable. No acute osseous abnormality noted. IMPRESSION: No acute cardiopulmonary abnormality. Electronically Signed   By: Valentino Saxon MD   On: 10/14/2020 14:37   DG Ankle Complete Right  Result Date: 10/14/2020 CLINICAL DATA:  Postop EXAM: RIGHT ANKLE - COMPLETE 3+ VIEW COMPARISON:  06/21/2020, CT 08/24/2020 FINDINGS: Interval resection of distal fibula with smooth appearing cut margin of the fibula. Interval surgical plate and multiple screw fixation of the tibia, talus, and calcaneus. Hardware appears grossly intact. Redemonstrated Charcot  arthropathy of the midfoot with bony destructive change, fragmentation and collapse. Generalized soft tissue swelling. IMPRESSION: Interval operative changes of the left ankle and hindfoot including resection of distal fibula and placement of fixating plate and surgical screws extending from the distal tibia to the calcaneus. Charcot arthropathy of the mid foot as before, though limits assessment for acute process. Electronically Signed   By: Donavan Foil M.D.   On: 10/14/2020 22:36   NM Pulmonary Perfusion  Result Date: 10/15/2020 CLINICAL DATA:  Shortness of breath and chest pain. Elevated D-dimer EXAM: NUCLEAR MEDICINE PERFUSION LUNG SCAN TECHNIQUE: Perfusion images were obtained in multiple projections after intravenous injection of radiopharmaceutical. Views: Anterior, posterior, left lateral, right lateral, RPO, LPO, RAO, LAO RADIOPHARMACEUTICALS:  4.4 mCi Tc-76mMAA  IV COMPARISON:  Chest radiograph Oct 14, 2020 FINDINGS: Radiotracer uptake bilaterally is homogeneous and symmetric. No perfusion defects evident. IMPRESSION: No evident perfusion defects. No findings indicative of pulmonary embolus. Electronically Signed   By: WLowella GripIII M.D.   On: 10/15/2020 18:53   DG Foot Complete Right  Result Date: 10/14/2020 CLINICAL DATA:  Postop fever EXAM: RIGHT FOOT COMPLETE - 3+ VIEW COMPARISON:  CT 06/26/2020, radiograph 06/21/2020 FINDINGS: Soft tissue swelling is present. Charcot arthropathy of the midfoot. Vascular calcifications. Osseous demineralization without frank destructive change at the digits or metatarsals. IMPRESSION: No definite acute osseous abnormality involving the digits or metatarsals of the right foot Electronically Signed   By: KDonavan FoilM.D.   On: 10/14/2020 22:37   ECHOCARDIOGRAM COMPLETE  Result Date: 10/15/2020    ECHOCARDIOGRAM REPORT   Patient Name:   Javier Hunt Date of Exam: 10/15/2020 Medical Rec #:  [id number]           Height:       66.0 in Accession #:    [id number]          Weight:       205.1 lb Date of Birth:  518-Jun-1977            BSA:          2.021 m Patient Age:    89years             BP:           182/82 mmHg Patient Gender: M                    HR:           81 bpm. Exam Location:  Inpatient Procedure: 2D Echo, Cardiac Doppler and Color Doppler Indications:    R07.9* Chest pain, unspecified  History:        Patient has prior history of Echocardiogram examinations, most                 recent 08/12/2019. Risk Factors:Hypertension, Diabetes and Sleep                 Apnea. CKD.  Sonographer:    TJonelle SidleDance Referring Phys: [id number] 1. Left ventricular ejection fraction, by estimation, is 55 to 60%. The left ventricle has normal function. The left ventricle has no regional wall motion abnormalities. There is mild left ventricular hypertrophy. Left ventricular diastolic parameters were  normal.  2. Right ventricular systolic function is normal. The right ventricular size is normal. Tricuspid regurgitation signal is inadequate for assessing PA pressure.  3. The mitral valve is grossly normal. Trivial mitral valve regurgitation.  4. The  aortic valve is tricuspid. Aortic valve regurgitation is not visualized.  5. The inferior vena cava is normal in size with greater than 50% respiratory variability, suggesting right atrial pressure of 3 mmHg. FINDINGS  Left Ventricle: Left ventricular ejection fraction, by estimation, is 55 to 60%. The left ventricle has normal function. The left ventricle has no regional wall motion abnormalities. The left ventricular internal cavity size was normal in size. There is  mild left ventricular hypertrophy. Left ventricular diastolic parameters were normal. Right Ventricle: The right ventricular size is normal. No increase in right ventricular wall thickness. Right ventricular systolic function is normal. Tricuspid regurgitation signal is inadequate for assessing PA pressure. Left Atrium: Left atrial size was normal in size. Right Atrium: Right atrial size was normal in size. Pericardium: There is no evidence of pericardial effusion. Mitral Valve: The mitral valve is grossly normal. Trivial mitral valve regurgitation. Tricuspid Valve: The tricuspid valve is grossly normal. Tricuspid valve regurgitation is trivial. Aortic Valve: The aortic valve is tricuspid. Aortic valve regurgitation is not visualized. Pulmonic Valve: The pulmonic valve was grossly normal. Pulmonic valve regurgitation is trivial. Aorta: The aortic root is normal in size and structure. Venous: The inferior vena cava is normal in size with greater than 50% respiratory variability, suggesting right atrial pressure of 3 mmHg. IAS/Shunts: No atrial level shunt detected by color flow Doppler.  LEFT VENTRICLE PLAX 2D LVIDd:         4.90 cm  Diastology LVIDs:         3.30 cm  LV e' medial:    8.05 cm/s LV PW:          1.20 cm  LV E/e' medial:  11.8 LV IVS:        1.10 cm  LV e' lateral:   11.60 cm/s LVOT diam:     2.20 cm  LV E/e' lateral: 8.2 LV SV:         78 LV SV Index:   39 LVOT Area:     3.80 cm  RIGHT VENTRICLE             IVC RV Basal diam:  2.60 cm     IVC diam: 2.20 cm RV S prime:     22.10 cm/s TAPSE (M-mode): 2.8 cm LEFT ATRIUM             Index       RIGHT ATRIUM           Index LA diam:        4.20 cm 2.08 cm/m  RA Area:     13.00 cm LA Vol (A2C):   53.1 ml 26.27 ml/m RA Volume:   27.00 ml  13.36 ml/m LA Vol (A4C):   65.5 ml 32.40 ml/m LA Biplane Vol: 58.8 ml 29.09 ml/m  AORTIC VALVE LVOT Vmax:   94.20 cm/s LVOT Vmean:  68.100 cm/s LVOT VTI:    0.205 m  AORTA Ao Root diam: 3.20 cm Ao Asc diam:  2.90 cm MITRAL VALVE MV Area (PHT): 3.63 cm    SHUNTS MV Decel Time: 209 msec    Systemic VTI:  0.20 m MV E velocity: 94.90 cm/s  Systemic Diam: 2.20 cm MV A velocity: 81.40 cm/s MV E/A ratio:  1.17 Rozann Lesches MD Electronically signed by Rozann Lesches MD Signature Date/Time: 10/15/2020/1:00:42 PM    Final    VAS [place] LOWER EXTREMITY VENOUS (DVT) (ONLY [facility] & [facility] 7a-7p)  Result Date: 10/14/2020  Lower Venous DVT Study Patient  Name:  Mccade Studley.  Date of Exam:   10/14/2020 Medical Rec #: [id number]             Accession #:    [id number] Date of Birth: 1975-08-01              Patient Gender: M Patient Age:   45Y Exam [facility]  [facility] Procedure:      VAS [place] LOWER EXTREMITY VENOUS (DVT) Referring Phys: [id number] LAURA A MURPHY --------------------------------------------------------------------------------  Indications: RLE upper leg pain post ankle surgery.  Limitations: Poor ultrasound/tissue interface. Comparison Study: Previous exam 08/27/19 - negative Performing Technologist: Rogelia Rohrer  Examination Guidelines: A complete evaluation includes B-mode imaging, spectral Doppler, color Doppler, and power Doppler as needed of all accessible portions of each vessel. Bilateral testing is considered  an integral part of a complete examination. Limited examinations for reoccurring indications may be performed as noted. The reflux portion of the exam is performed with the patient in reverse Trendelenburg.  +---------+---------------+---------+-----------+----------+-------------------+ RIGHT    CompressibilityPhasicitySpontaneityPropertiesThrombus Aging      +---------+---------------+---------+-----------+----------+-------------------+ CFV      Full           Yes      Yes                                      +---------+---------------+---------+-----------+----------+-------------------+ SFJ      Full                                                             +---------+---------------+---------+-----------+----------+-------------------+ FV Prox  Full           Yes      Yes                                      +---------+---------------+---------+-----------+----------+-------------------+ FV Mid   Full           Yes      Yes                                      +---------+---------------+---------+-----------+----------+-------------------+ FV DistalFull           Yes      Yes                                      +---------+---------------+---------+-----------+----------+-------------------+ PFV      Full                                                             +---------+---------------+---------+-----------+----------+-------------------+ POP      Full           Yes      Yes                                      +---------+---------------+---------+-----------+----------+-------------------+  PTV      Full                                         Not well visualized +---------+---------------+---------+-----------+----------+-------------------+ PERO     Full                                         Not well visualized +---------+---------------+---------+-----------+----------+-------------------+    +----+---------------+---------+-----------+----------+--------------+ LEFTCompressibilityPhasicitySpontaneityPropertiesThrombus Aging +----+---------------+---------+-----------+----------+--------------+ CFV Full           Yes      Yes                                 +----+---------------+---------+-----------+----------+--------------+    Summary: RIGHT: - There is no evidence of deep vein thrombosis in the lower extremity. - There is no evidence of superficial venous thrombosis.  - No cystic structure found in the popliteal fossa.  LEFT: - No evidence of common femoral vein obstruction.  *See table(s) above for measurements and observations. Electronically signed by Servando Snare MD on 10/14/2020 at 4:55:23 PM.    Final     Echocardiogram, VQ scan.   Subjective: Patient was seen and examined at bedside.  Overnight events noted.  Patient reports feeling much improved,  Chest pain has completely resolved.  Patient is able to breathe better.  He wants to be discharged.  Discharge Exam: Vitals:   10/16/20 0611 10/16/20 0907  BP: (!) 167/72 (!) 175/80  Pulse: 81 75  Resp: 19   Temp: 99.9 F (37.7 C)   SpO2: 92%    Vitals:   10/15/20 1823 10/15/20 2155 10/16/20 0611 10/16/20 0907  BP: (!) 172/62 (!) 157/71 (!) 167/72 (!) 175/80  Pulse: 78 82 81 75  Resp: '18 18 19   '$ Temp: 99.1 F (37.3 C) 99.6 F (37.6 C) 99.9 F (37.7 C)   TempSrc: Oral Oral Oral   SpO2: 96% 96% 92%   Weight:      Height:        General: Pt is alert, awake, not in acute distress Cardiovascular: RRR, S1/S2 +, no rubs, no gallops Respiratory: CTA bilaterally, no wheezing, no rhonchi Abdominal: Soft, NT, ND, bowel sounds + Extremities: no edema, no cyanosis    The results of significant diagnostics from this hospitalization (including imaging, microbiology, ancillary and laboratory) are listed below for reference.     Microbiology: Recent Results (from the past 240 hour(s))  Resp Panel by RT-PCR  (Flu A&B, Covid) Nasopharyngeal Swab     Status: None   Collection Time: 10/14/20  9:47 PM   Specimen: Nasopharyngeal Swab; Nasopharyngeal(NP) swabs in vial transport medium  Result Value Ref Range Status   SARS Coronavirus 2 by RT PCR NEGATIVE NEGATIVE Final    Comment: (NOTE) SARS-CoV-2 target nucleic acids are NOT DETECTED.  The SARS-CoV-2 RNA is generally detectable in upper respiratory specimens during the acute phase of infection. The lowest concentration of SARS-CoV-2 viral copies this assay can detect is 138 copies/mL. A negative result does not preclude SARS-Cov-2 infection and should not be used as the sole basis for treatment or other patient management decisions. A negative result may occur with  improper specimen collection/handling, submission of specimen other than nasopharyngeal swab, presence of viral mutation(s) within  the areas targeted by this assay, and inadequate number of viral copies(<138 copies/mL). A negative result must be combined with clinical observations, patient history, and epidemiological information. The expected result is Negative.  Fact Sheet for Patients:  EntrepreneurPulse.com.au  Fact Sheet for Healthcare Providers:  IncredibleEmployment.be  This test is no t yet approved or cleared by the [place] FDA and  has been authorized for detection and/or diagnosis of SARS-CoV-2 by FDA under an Emergency Use Authorization (EUA). This EUA will remain  in effect (meaning this test can be used) for the duration of the COVID-19 declaration under Section 564(b)(1) of the Act, 21 U.S.C.section 360bbb-3(b)(1), unless the authorization is terminated  or revoked sooner.       Influenza A by PCR NEGATIVE NEGATIVE Final   Influenza B by PCR NEGATIVE NEGATIVE Final    Comment: (NOTE) The Xpert Xpress SARS-CoV-2/FLU/RSV plus assay is intended as an aid in the diagnosis of influenza from Nasopharyngeal swab specimens  and should not be used as a sole basis for treatment. Nasal washings and aspirates are unacceptable for Xpert Xpress SARS-CoV-2/FLU/RSV testing.  Fact Sheet for Patients: EntrepreneurPulse.com.au  Fact Sheet for Healthcare Providers: IncredibleEmployment.be  This test is not yet approved or cleared by the [place] FDA and has been authorized for detection and/or diagnosis of SARS-CoV-2 by FDA under an Emergency Use Authorization (EUA). This EUA will remain in effect (meaning this test can be used) for the duration of the COVID-19 declaration under Section 564(b)(1) of the Act, 21 U.S.C. section 360bbb-3(b)(1), unless the authorization is terminated or revoked.  Performed at [facility], [place] [address] [place], [place] [address]   Culture, blood (routine x 2)     Status: None (Preliminary result)   Collection Time: 10/15/20  9:49 AM   Specimen: BLOOD  Result Value Ref Range Status   Specimen Description   Final    BLOOD Performed by [facility] Performed at [facility], [place] [address], [place], [place] [address]    Special Requests   Final    BOTTLES DRAWN AEROBIC ONLY Blood Culture adequate volume Performed at [place] [address] [place], [place] [address]    Culture   Final    NO GROWTH < 12 HOURS Performed at [place] [address], [place], [place] [address]    Report Status PENDING  Incomplete  Culture, blood (routine x 2)     Status: None (Preliminary result)   Collection Time: 10/15/20  9:49 AM   Specimen: BLOOD  Result Value Ref Range Status   Specimen Description   Final    BLOOD Performed by [facility] Performed at [facility], [place] [address] [place], [place] [address]    Special Requests   Final    BOTTLES DRAWN AEROBIC ONLY Blood Culture adequate volume Performed at [place] [address] [place], [place] [address]    Culture   Final    NO GROWTH < 12 HOURS Performed at [place] [address] [place], [place] [address]    Report Status PENDING  Incomplete     Labs: BNP (last 3 results) No results for input(s): BNP in the last 8760 hours. Basic Metabolic Panel: Recent Labs  Lab 10/14/20 1654 10/15/20 0019 10/15/20 0949 10/16/20 0430  NA 134* 137 137 137  K 3.8 3.9 3.9 3.6  CL 100 104 103 107  CO2 26 26  24 26  GLUCOSE 216* 283* 242* 237*  BUN 34* 33* 32* 26*  CREATININE 2.36* 2.48* 2.28* 2.23*  CALCIUM 8.9 8.3* 8.6* 8.5*  MG  --   --   --  1.8  PHOS  --   --   --  2.1*   Liver Function Tests: No results for input(s): AST, ALT, ALKPHOS, BILITOT, PROT, ALBUMIN in the last 168 hours. No results for input(s): LIPASE, AMYLASE in the last 168 hours. No results for input(s): AMMONIA in the last 168 hours. CBC: Recent Labs  Lab 10/14/20 1654 10/15/20 0949 10/16/20 0430  WBC 18.6* 14.3*  14.6* 11.5*  HGB 11.1* 9.8*  9.8* 9.3*  HCT 35.1* 31.5*  31.6* 29.2*  MCV 84.4 84.7  85.9 83.0  PLT 255 229  234 256   Cardiac Enzymes: No results for input(s): CKTOTAL, CKMB, CKMBINDEX, TROPONINI in the last 168 hours. BNP: Invalid input(s): POCBNP CBG: Recent Labs  Lab 10/15/20 1144 10/15/20 1644 10/15/20 2154 10/16/20 0217 10/16/20 0747  GLUCAP 190* 257* 341* 237* 210*   D-Dimer No results for input(s): DDIMER in the last 72 hours. Hgb A1c No results for input(s): HGBA1C in the last 72 hours. Lipid Profile No results for input(s): CHOL, HDL, LDLCALC, TRIG, CHOLHDL, LDLDIRECT in the last 72 hours. Thyroid function studies No results for input(s): TSH, T4TOTAL, T3FREE, THYROIDAB in the last 72 hours.  Invalid input(s): FREET3 Anemia work up No results for input(s): VITAMINB12, FOLATE, FERRITIN, TIBC, IRON, RETICCTPCT in the last 72 hours. Urinalysis    Component Value Date/Time   COLORURINE YELLOW 10/15/2020 [place] 10/15/2020 0854   LABSPEC 1.013 10/15/2020 0854   PHURINE 5.0 10/15/2020 0854   GLUCOSEU >=500 (A) 10/15/2020 0854   HGBUR MODERATE (A) 10/15/2020 0854   BILIRUBINUR NEGATIVE 10/15/2020 0854   KETONESUR 5 (A) 10/15/2020 0854   PROTEINUR >=300 (A) 10/15/2020 0854   NITRITE NEGATIVE 10/15/2020 0854   LEUKOCYTESUR NEGATIVE 10/15/2020 0854   Sepsis Labs Invalid input(s): PROCALCITONIN,  WBC,  LACTICIDVEN Microbiology Recent Results (from the past 240 hour(s))  Resp Panel by RT-PCR (Flu A&B, Covid) Nasopharyngeal Swab     Status: None   Collection Time: 10/14/20  9:47 PM   Specimen: Nasopharyngeal Swab; Nasopharyngeal(NP) swabs in vial transport medium  Result Value Ref Range Status   SARS Coronavirus 2 by RT PCR NEGATIVE NEGATIVE Final    Comment: (NOTE) SARS-CoV-2 target nucleic acids are NOT DETECTED.  The SARS-CoV-2 RNA is generally detectable in upper respiratory specimens during the acute phase of infection. The lowest concentration of SARS-CoV-2 viral copies this assay can detect is 138 copies/mL. A negative result does not preclude SARS-Cov-2 infection and should not be used as the sole basis for treatment or other patient management decisions. A negative result may occur with  improper specimen collection/handling, submission of specimen other than nasopharyngeal swab, presence of viral mutation(s) within the areas targeted by this assay, and inadequate number of viral copies(<138 copies/mL). A negative result must be combined with clinical observations, patient history, and epidemiological information. The expected result is Negative.  Fact Sheet for Patients:  EntrepreneurPulse.com.au  Fact Sheet for Healthcare Providers:  IncredibleEmployment.be  This test is no t yet approved or cleared by the [place] FDA and  has been authorized for detection and/or diagnosis of SARS-CoV-2 by FDA under an Emergency Use  Authorization (EUA). This EUA will remain  in effect (meaning this test can be used) for the duration of the COVID-19 declaration  under Section 564(b)(1) of the Act, 21 U.S.C.section 360bbb-3(b)(1), unless the authorization is terminated  or revoked sooner.       Influenza A by PCR NEGATIVE NEGATIVE Final   Influenza B by PCR NEGATIVE NEGATIVE Final    Comment: (NOTE) The Xpert Xpress SARS-CoV-2/FLU/RSV plus assay is intended as an aid in the diagnosis of influenza from Nasopharyngeal swab specimens and should not be used as a sole basis for treatment. Nasal washings and aspirates are unacceptable for Xpert Xpress SARS-CoV-2/FLU/RSV testing.  Fact Sheet for Patients: EntrepreneurPulse.com.au  Fact Sheet for Healthcare Providers: IncredibleEmployment.be  This test is not yet approved or cleared by the [place] FDA and has been authorized for detection and/or diagnosis of SARS-CoV-2 by FDA under an Emergency Use Authorization (EUA). This EUA will remain in effect (meaning this test can be used) for the duration of the COVID-19 declaration under Section 564(b)(1) of the Act, 21 U.S.C. section 360bbb-3(b)(1), unless the authorization is terminated or revoked.  Performed at [facility], [place] [address] [place], [place] [address]   Culture, blood (routine x 2)     Status: None (Preliminary result)   Collection Time: 10/15/20  9:49 AM   Specimen: BLOOD  Result Value Ref Range Status   Specimen Description   Final    BLOOD Performed by [facility] Performed at [facility], [place] [address] [place], [place] [address]    Special Requests   Final    BOTTLES DRAWN AEROBIC ONLY Blood Culture adequate volume Performed at [place] [address] [place], [place] [address]    Culture   Final    NO GROWTH < 12 HOURS Performed at [facility] [place] [address]  [place], [place] [address]    Report Status PENDING  Incomplete  Culture, blood (routine x 2)     Status: None (Preliminary result)   Collection Time: 10/15/20  9:49 AM   Specimen: BLOOD  Result Value Ref Range Status   Specimen Description   Final    BLOOD Performed by [facility] Performed at [facility], [place] [address], [place], [place] [address]    Special Requests   Final    BOTTLES DRAWN AEROBIC ONLY Blood Culture adequate volume Performed at [place] [address], [place], [place] [address]    Culture   Final    NO GROWTH < 12 HOURS Performed at [place] [address], [place], [place] [address]    Report Status PENDING  Incomplete     Time coordinating discharge: Over 30 minutes  SIGNED:   Shawna Clamp, MD  [facility] 10/16/2020, 9:48 AM Pager   If 7PM-7AM, please contact night-coverage [email or web address]

## 2020-10-17 ENCOUNTER — Telehealth: Payer: Self-pay | Admitting: *Deleted

## 2020-10-17 DIAGNOSIS — R079 Chest pain, unspecified: Secondary | ICD-10-CM

## 2020-10-17 NOTE — Telephone Encounter (Signed)
-----   Message from Frederic Jericho sent at 10/17/2020  8:50 AM EDT ----- Regarding: ATTESTATION Good Morning,  Please create ATTESTATION for the ordered Myoview please. Thank you and hope you have a great Monday!!!

## 2020-10-17 NOTE — Progress Notes (Signed)
Orthopedic Tech Progress Note Patient Details:  Javier Hunt. [birth year] [id number]  Ortho Devices Type of Ortho Device: Ace wrap Ortho Device/Splint Location: charge for splint supplies       Javier Hunt 10/17/2020, 11:06 AM

## 2020-10-18 ENCOUNTER — Ambulatory Visit (INDEPENDENT_AMBULATORY_CARE_PROVIDER_SITE_OTHER): Payer: 59 | Admitting: Podiatry

## 2020-10-18 ENCOUNTER — Telehealth: Payer: Self-pay | Admitting: *Deleted

## 2020-10-18 ENCOUNTER — Other Ambulatory Visit: Payer: Self-pay

## 2020-10-18 ENCOUNTER — Telehealth (HOSPITAL_COMMUNITY): Payer: Self-pay

## 2020-10-18 DIAGNOSIS — Z9889 Other specified postprocedural states: Secondary | ICD-10-CM

## 2020-10-18 DIAGNOSIS — R079 Chest pain, unspecified: Secondary | ICD-10-CM

## 2020-10-18 DIAGNOSIS — M14671 Charcot's joint, right ankle and foot: Secondary | ICD-10-CM

## 2020-10-18 MED ORDER — RIVAROXABAN 10 MG PO TABS: 10.0000 mg | ORAL_TABLET | Freq: Every day | ORAL | 0 refills | Status: DC

## 2020-10-18 NOTE — Telephone Encounter (Signed)
This encounter was created in error - please disregard.

## 2020-10-18 NOTE — Addendum Note (Signed)
Addended by: Alvina Filbert B on: 10/18/2020 07:20 PM   Modules accepted: Level of Service, SmartSet

## 2020-10-18 NOTE — Telephone Encounter (Signed)
Spoke with the patient, detailed instructions given. He stated that he would be here for his test. Asked to call back with any questions. [name] EMTP 

## 2020-10-18 NOTE — Telephone Encounter (Signed)
-----   Message from Ledora Bottcher, [place] sent at 10/15/2020  2:38 PM EDT ----- Dr. [name] saw this patient in consult and needs to be set up for OP nuclear stress test. Can you please send the consent to him to sign? I have not spoken with the patient. He will need a follow up with an APP at [facility] 2-4 weeks. [name] is his new cardiologist.   Thank you Angie

## 2020-10-18 NOTE — Progress Notes (Signed)
  Subjective:  Patient ID: Javier Hunt., male    DOB: [birth year],  MRN: [id number]  Chief Complaint  Patient presents with  . Routine Post Op    POV #1 DOS 10/12/2020 RT FOOT/ANKLE TIBIALTALO CALCANEAL FUSION W/PLATE OR ROD FIXATION W/HARVEST OF BONE GRAFT AS INDICATED, POSS FUSION OF MIDFOOT JOINTS INCLUDING MEDIAL COLUMN FUSION. No N/V, fever or chills. Pt complains of soreness and numbness.    DOS: 10/12/20 Procedure: Right TTC fusion  45 y.o. male presents with the above complaint. History confirmed with patient.  Objective:  Physical Exam: tenderness at the surgical site, local edema noted and calf supple, nontender. Incision: healing well, no significant drainage, no dehiscence, no significant erythema   Assessment:   1. Charcot's joint of right foot   2. Post-operative state    Plan:  Patient was evaluated and treated and all questions answered.  Post-operative [facility] records reviewed. -Rx Xarelto for DVT PPx. He did not appear to have evidence of DVT however symptoms could be explained by DVT/PE.  -Surgical site well appearing. -Continue NWB with knee scooter  No follow-ups on file.

## 2020-10-19 ENCOUNTER — Encounter (INDEPENDENT_AMBULATORY_CARE_PROVIDER_SITE_OTHER): Payer: PRIVATE HEALTH INSURANCE | Admitting: Ophthalmology

## 2020-10-20 ENCOUNTER — Ambulatory Visit (HOSPITAL_COMMUNITY): Payer: 59

## 2020-10-20 LAB — CULTURE, BLOOD (ROUTINE X 2)
Culture: NO GROWTH
Culture: NO GROWTH
Special Requests: ADEQUATE
Special Requests: ADEQUATE

## 2020-10-21 ENCOUNTER — Telehealth: Payer: Self-pay | Admitting: *Deleted

## 2020-10-21 DIAGNOSIS — R079 Chest pain, unspecified: Secondary | ICD-10-CM

## 2020-10-21 NOTE — Telephone Encounter (Signed)
-----   Message from Earvin Hansen, LPN sent at [id number]  7:17 PM EDT ----- Hello all  Please call patient and arrange  Thanks Rip Harbour  ----- Message ----- From: Ledora Bottcher, PA Sent: 10/15/2020   2:42 PM EDT To: [place] [facility] Triage  Dr. [name] saw this patient in consult and needs to be set up for OP nuclear stress test. Can you please send the consent to him to sign? I have not spoken with the patient. He will need a follow up with an APP at [facility] 2-4 weeks. [name] is his new cardiologist.   Thank you Angie

## 2020-10-25 ENCOUNTER — Ambulatory Visit (INDEPENDENT_AMBULATORY_CARE_PROVIDER_SITE_OTHER): Payer: 59 | Admitting: Podiatry

## 2020-10-25 ENCOUNTER — Other Ambulatory Visit: Payer: Self-pay

## 2020-10-25 DIAGNOSIS — M14671 Charcot's joint, right ankle and foot: Secondary | ICD-10-CM

## 2020-10-25 DIAGNOSIS — Z9889 Other specified postprocedural states: Secondary | ICD-10-CM

## 2020-10-25 NOTE — Progress Notes (Signed)
  Subjective:  Patient ID: Javier Hunt., male    DOB: 02-Feb-1976,  MRN: [id number]  Chief Complaint  Patient presents with  . Routine Post Op    POV #2 DOS 10/12/2020 RT FOOT/ANKLE TIBIALTALO CALCANEAL FUSION W/PLATE OR ROD FIXATION W/HARVEST OF BONE GRAFT AS INDICATED, POSS FUSION OF MIDFOOT JOINTS INCLUDING MEDIAL COLUMN FUSION. Denies signs of infections, minimal pain levels, reports excessive bleeding-had to change out wrappings since last visit. Otherwise doing well.    DOS: 10/12/20 Procedure: Right TTC fusion  45 y.o. male presents with the above complaint. History confirmed with patient.  Objective:  Physical Exam: tenderness at the surgical site, local edema noted and calf supple, nontender. Incision: healing well proximally. Distally small area of blistering. No full dehiscence. No warmth, erythema, SOI.   Assessment:   1. Charcot's joint of right foot   2. Post-operative state    Plan:  Patient was evaluated and treated and all questions answered.  Post-operative State -Wound cleansed. -Continue Xarelto -Mild blistering along distal aspect of incision. No warmth, erythema, signs of infection. Wound a little slower to heal at this area. No full dehiscence. -Dressed with betadine WTD. Continue to do so every few days until next follow up. Should the wound worsen report back promptly for evaluation. -Continue NWB with knee scooter  No follow-ups on file.

## 2020-11-01 ENCOUNTER — Other Ambulatory Visit: Payer: Self-pay

## 2020-11-01 ENCOUNTER — Ambulatory Visit (INDEPENDENT_AMBULATORY_CARE_PROVIDER_SITE_OTHER): Payer: 59 | Admitting: Podiatry

## 2020-11-01 DIAGNOSIS — M14671 Charcot's joint, right ankle and foot: Secondary | ICD-10-CM

## 2020-11-01 DIAGNOSIS — Z9889 Other specified postprocedural states: Secondary | ICD-10-CM

## 2020-11-01 NOTE — Progress Notes (Signed)
  Subjective:  Patient ID: Javier November., male    DOB: Nov 17, 1975,  MRN: [id number]  Chief Complaint  Patient presents with  . Routine Post Op    POV #3 DOS 10/12/2020 RT FOOT/ANKLE TIBIALTALO CALCANEAL FUSION W/PLATE OR ROD FIXATION W/HARVEST OF BONE GRAFT AS INDICATED, POSS FUSION OF MIDFOOT JOINTS INCLUDING MEDIAL COLUMN FUSION. Pt states he has been doing well. No N/V, fever or chills.    DOS: 10/12/20 Procedure: Right TTC fusion  45 y.o. male presents with the above complaint. History confirmed with patient.  Objective:  Physical Exam: tenderness at the surgical site, local edema noted and calf supple, nontender. Incision: healing well proximally. Distally small area of delayed healing. No full dehiscence. No warmth, erythema, SOI.   Assessment:   1. Charcot's joint of right foot   2. Post-operative state    Plan:  Patient was evaluated and treated and all questions answered.  Post-operative State -Wound cleansed. -Continue Xarelto -Sutures and partial staple removal. -Dressed with abx ointment and DSD  Patient to put . Should the wound worsen report back promptly for evaluation. -Continue NWB with knee scooter -F/u next week for further staple removal and new XRs.  No follow-ups on file.

## 2020-11-08 ENCOUNTER — Ambulatory Visit (INDEPENDENT_AMBULATORY_CARE_PROVIDER_SITE_OTHER): Payer: 59

## 2020-11-08 ENCOUNTER — Other Ambulatory Visit: Payer: Self-pay

## 2020-11-08 ENCOUNTER — Ambulatory Visit (INDEPENDENT_AMBULATORY_CARE_PROVIDER_SITE_OTHER): Payer: 59 | Admitting: Podiatry

## 2020-11-08 DIAGNOSIS — Z9889 Other specified postprocedural states: Secondary | ICD-10-CM

## 2020-11-08 NOTE — Progress Notes (Signed)
  Subjective:  Patient ID: Javier November., male    DOB: [birth year],  MRN: [id number]  Chief Complaint  Patient presents with   Routine Post Op    pov#5 DOS 10/12/2020 RT FOOT/ANKLE TIBIALTALO CALCANEAL FUSION W/PLATE OR ROD FIXATION W/HARVEST OF BONE GRAFT AS INDICATED, POSS FUSION OF MIDFOOT JOINTS INCLUDING MEDIAL COLUMN FUSION Reports last time dressing changed area had an odor. Denies f/c/n/v. Also desires to know how much longer needs to be on blood thinners? Manageable pain lvls.    DOS: 10/12/20 Procedure: Right TTC fusion  45 y.o. male presents with the above complaint. History confirmed with patient.  Objective:  Physical Exam: tenderness at the surgical site, local edema noted and calf supple, nontender. Incision: healing well proximally. Distally small area of delayed healing. No full dehiscence. No warmth, erythema, SOI.   Assessment:   1. Post-operative state    Plan:  Patient was evaluated and treated and all questions answered.  Post-operative State -New x-rays with good alignment noted -Wound still some slow healing areas -Continue dressing daily with wet-to-dry to promote debridement -Okay to DC catheter at this point as he is now a month postsurgery  No follow-ups on file.

## 2020-11-15 ENCOUNTER — Ambulatory Visit (INDEPENDENT_AMBULATORY_CARE_PROVIDER_SITE_OTHER): Payer: 59 | Admitting: Physician Assistant

## 2020-11-15 ENCOUNTER — Other Ambulatory Visit: Payer: Self-pay | Admitting: Podiatry

## 2020-11-15 ENCOUNTER — Encounter: Payer: Self-pay | Admitting: Physician Assistant

## 2020-11-15 ENCOUNTER — Other Ambulatory Visit: Payer: Self-pay

## 2020-11-15 VITALS — BP 158/86 | HR 78 | Ht 66.0 in | Wt 203.0 lb

## 2020-11-15 DIAGNOSIS — L97509 Non-pressure chronic ulcer of other part of unspecified foot with unspecified severity: Secondary | ICD-10-CM | POA: Diagnosis not present

## 2020-11-15 DIAGNOSIS — R079 Chest pain, unspecified: Secondary | ICD-10-CM | POA: Diagnosis not present

## 2020-11-15 DIAGNOSIS — E10621 Type 1 diabetes mellitus with foot ulcer: Secondary | ICD-10-CM

## 2020-11-15 DIAGNOSIS — I10 Essential (primary) hypertension: Secondary | ICD-10-CM

## 2020-11-15 MED ORDER — HYDRALAZINE HCL 50 MG PO TABS: 50.0000 mg | ORAL_TABLET | Freq: Three times a day (TID) | ORAL | 3 refills | Status: DC

## 2020-11-15 NOTE — Patient Instructions (Signed)
Medication Instructions:  INCREASE Hydralazine to 50 mg 3 times a day  *If you need a refill on your cardiac medications before your next appointment, please call your pharmacy*   Lab Work: NONE ordered at this time of appointment    If you have labs (blood work) drawn today and your tests are completely normal, you will receive your results only by: [place] (if you have MyChart) OR A paper copy in the mail If you have any lab test that is abnormal or we need to change your treatment, we will call you to review the results.   Testing/Procedures: Your physician has requested that you have a lexiscan myoview. For further information please visit [email or web address]. Please follow instruction sheet, as given.  Previous order placed   Follow-Up: At [facility], you and your health needs are our priority.  As part of our continuing mission to provide you with exceptional heart care, we have created designated Provider Care Teams.  These Care Teams include your primary Cardiologist (physician) and Advanced Practice Providers (APPs -  Physician Assistants and Nurse Practitioners) who all work together to provide you with the care you need, when you need it.  We recommend signing up for the patient portal called "MyChart".  Sign up information is provided on this After Visit Summary.  MyChart is used to connect with patients for Virtual Visits (Telemedicine).  Patients are able to view lab/test results, encounter notes, upcoming appointments, etc.  Non-urgent messages can be sent to your provider as well.   To learn more about what you can do with MyChart, go to [email or web address].    Your next appointment:   Pending the results of stress test    The format for your next appointment:   In Person  Provider:   Kirk Ruths, MD or Almyra Deforest, PA-C  Other Instructions

## 2020-11-15 NOTE — Progress Notes (Signed)
Cardiology Office Note:    Date:  11/17/2020   ID:  Javier November., DOB Jul 19, 1975, MRN [id number]  PCP:  [name], MD   [facility] Providers Cardiologist:  Kirk Ruths, MD     Referring MD: [name], MD   Chief Complaint  Patient presents with   Chest Pain    Recently seen by Dr. [name]     History of Present Illness:    Javier Hunt. is a 45 y.o. male with a hx of hypertension, type 1 diabetes mellitus on insulin pump, obstructive sleep apnea, CKD stage III-IV, history of osteomyelitis of right calcaneus and a recent chest discomfort.  Patient was admitted to the hospital in May 2022 with chest pain.  Prior to that, he had right foot surgery for Charcot foot 2 days prior to the admission.  He subsequently developed generalized malaise, fever, chills, diffuse aches and include chest discomfort.  He was seen by Dr. [name] who felt his chest pain was atypical as they have been continuous for 3 days without resolving.  Chest pain was increased by inspiration and certain bodily movement.  EKG shows no acute changes.  Troponin minimally elevated with no clear trend.  Echo showed normal EF.  VQ scan was negative as well.  Outpatient nuclear stress test was recommended.  Patient presents today for follow-up.  Outpatient stress test was never done prior to the visit.  He says his chest pain has completely resolved and he is currently feeling very well.  On exam, he does not appears to be volume overloaded.  He is breathing is at baseline.  I recommended proceeding with outpatient nuclear stress test, if negative, he can follow-up with PCP from now instead of cardiology.  Blood pressure has been elevated, I will increase hydralazine to 50 mg 3 times a day dosing.  Past Medical History:  Diagnosis Date   Acute osteomyelitis of right calcaneus ([place])    s/p debiidement and wound vac 08-28-2019   Anemia of chronic renal failure, stage 3a ([place]) 09/10/2019   sees dr Kathaleen Maser 09-23-2019 on chart   Charcot's joint of right ankle    Chronic heel ulcer, right, limited to breakdown of skin ([place])    since march 2021, has wound vac since 10-27-2019 changed vac 10-12-2019   DM type 1 (diabetes mellitus, type 1) ([place])    Erythropoietin deficiency anemia 09/10/2019   Hyperkalemia    Hypertension    Iron deficiency anemia due to chronic blood loss 09/10/2019   Kidney disease    Neuropathy    feet and toes   Sleep apnea    cpap     Past Surgical History:  Procedure Laterality Date   ANKLE SURGERY Right    APPENDECTOMY  yrs ago   GRAFT APPLICATION Right 5/62/5638   Procedure: GRAFT APPLICATION;  Surgeon: Evelina Bucy, DPM;  Location: [facility];  Service: Podiatry;  Laterality: Right;   I & D EXTREMITY Right 10/16/2019   Procedure: IRRIGATION AND DEBRIDEMENT OF RIGHT HEEL WITH APPLICATION WOUND VAC;  Surgeon: Evelina Bucy, DPM;  Location: [facility];  Service: Podiatry;  Laterality: Right;   INCISION AND DRAINAGE OF WOUND Right 10/28/2019   Procedure: IRRIGATION AND DEBRIDEMENT WOUND RIGHT HEEL;  Surgeon: Evelina Bucy, DPM;  Location: [facility];  Service: Podiatry;  Laterality: Right;   IRRIGATION AND DEBRIDEMENT FOOT Right 08/28/2019   Procedure: RIght foot wound incision and drainage, debridement  and irrigation, bone biopsy, application of wound VAC;  Surgeon: Evelina Bucy, DPM;  Location: [facility];  Service: Podiatry;  Laterality: Right;    Current Medications: Current Meds  Medication Sig   acetaminophen (TYLENOL) 500 MG tablet Take 500 mg by mouth every 6 (six) hours as needed for moderate pain.   aspirin 81 MG tablet Take 81 mg by mouth daily.   atorvastatin (LIPITOR) 40 MG tablet Take 40 mg by mouth daily at 6 PM.    BINAXNOW COVID-19 AG HOME TEST KIT USE AS DIRECTED IN PACKAGE   CONTOUR NEXT TEST test strip USE 8 TIMES DAILY AND AS DIRECTED   ezetimibe (ZETIA) 10 MG tablet Take 10 mg by mouth  daily.   ferrous sulfate 325 (65 FE) MG tablet Take 1 tablet (325 mg total) by mouth 2 (two) times daily with a meal.   Insulin Human (INSULIN PUMP) SOLN Inject into the skin. Humalog. Base: 25.45 units per day.  1 unit per 10 grams of carbohydrates.  Basal rate varies usually 0.95   insulin lispro (HUMALOG) 100 UNIT/ML injection 100U/DAY VIA PUMP SUBCUTANEOUSLY   metoprolol succinate (TOPROL-XL) 100 MG 24 hr tablet Take 100 mg by mouth daily.   omeprazole (PRILOSEC) 40 MG capsule Take 40 mg by mouth daily.   Vitamin D, Ergocalciferol, (DRISDOL) 50000 UNITS CAPS capsule Take 50,000 Units by mouth 2 (two) times a week. Monday and Wednesday   [DISCONTINUED] clindamycin (CLEOCIN) 300 MG capsule Take 1 capsule (300 mg total) by mouth 2 (two) times daily.   [DISCONTINUED] hydrALAZINE (APRESOLINE) 25 MG tablet Take 1 tablet (25 mg total) by mouth every 8 (eight) hours.   [DISCONTINUED] oxyCODONE (ROXICODONE) 5 MG immediate release tablet Take 2 tablets (10 mg total) by mouth every 4 (four) hours as needed for severe pain.   [DISCONTINUED] rivaroxaban (XARELTO) 10 MG TABS tablet Take 1 tablet (10 mg total) by mouth daily.     Allergies:   Lisinopril and Keflex [cephalexin]   Social History   Socioeconomic History   Marital status: Married    Spouse name: Emergency planning/management officer   Number of children: 1   Years of education: 12   Highest education level: High school graduate  Occupational History   Not on file  Tobacco Use   Smoking status: Never   Smokeless tobacco: Never  Vaping Use   Vaping Use: Never used  Substance and Sexual Activity   Alcohol use: No   Drug use: Never   Sexual activity: Not on file  Other Topics Concern   Not on file  Social History Narrative   Not on file   Social Determinants of Health   Financial Resource Strain: Not on file  Food Insecurity: Not on file  Transportation Needs: Not on file  Physical Activity: Not on file  Stress: Not on file  Social Connections: Not  on file     Family History: The patient's family history includes Cancer in his father and mother; Heart failure in his father and mother; Stroke in his mother.  ROS:   Please see the history of present illness.     All other systems reviewed and are negative.  EKGs/Labs/Other Studies Reviewed:    The following studies were reviewed today:  Echo 10/15/2020  1. Left ventricular ejection fraction, by estimation, is 55 to 60%. The  left ventricle has normal function. The left ventricle has no regional  wall motion abnormalities. There is mild left ventricular hypertrophy.  Left ventricular diastolic parameters  were normal.   2. Right ventricular systolic function is normal. The right ventricular  size is normal. Tricuspid regurgitation signal is inadequate for assessing  PA pressure.   3. The mitral valve is grossly normal. Trivial mitral valve  regurgitation.   4. The aortic valve is tricuspid. Aortic valve regurgitation is not  visualized.   5. The inferior vena cava is normal in size with greater than 50%  respiratory variability, suggesting right atrial pressure of 3 mmHg.   EKG:  EKG is not ordered today.    Recent Labs: 10/16/2020: BUN 26; Creatinine, Ser 2.23; Hemoglobin 9.3; Magnesium 1.8; Platelets 256; Potassium 3.6; Sodium 137  Recent Lipid Panel No results found for: CHOL, TRIG, HDL, CHOLHDL, VLDL, LDLCALC, LDLDIRECT   Risk Assessment/Calculations:       Physical Exam:    VS:  BP (!) 158/86   Pulse 78   Ht '5\' 6"'  (1.676 m)   Wt 203 lb (92.1 kg)   SpO2 97%   BMI 32.77 kg/m     Wt Readings from Last 3 Encounters:  11/15/20 203 lb (92.1 kg)  10/15/20 205 lb 1.6 oz (93 kg)  10/28/19 193 lb 6.4 oz (87.7 kg)     GEN:  Well nourished, well developed in no acute distress HEENT: Normal NECK: No JVD; No carotid bruits LYMPHATICS: No lymphadenopathy CARDIAC: RRR, no murmurs, rubs, gallops RESPIRATORY:  Clear to auscultation without rales, wheezing or rhonchi   ABDOMEN: Soft, non-tender, non-distended MUSCULOSKELETAL:  No edema; No deformity  SKIN: Warm and dry NEUROLOGIC:  Alert and oriented x 3 PSYCHIATRIC:  Normal affect   ASSESSMENT:    1. Chest pain, unspecified type   2. Essential hypertension   3. Type 1 diabetes mellitus with foot ulcer (HCC)    PLAN:    In order of problems listed above:  Chest pain: Characteristic of the chest pain was atypical.  I recommend outpatient Myoview.  If negative, he can follow-up with his PCP from now.  Hypertension: Blood pressure elevated, increase hydralazine to 50 mg 3 times a day  Type 1 diabetes: On insulin.  Managed by primary care provider.         Medication Adjustments/Labs and Tests Ordered: Current medicines are reviewed at length with the patient today.  Concerns regarding medicines are outlined above.  No orders of the defined types were placed in this encounter.  Meds ordered this encounter  Medications   hydrALAZINE (APRESOLINE) 50 MG tablet    Sig: Take 1 tablet (50 mg total) by mouth 3 (three) times daily.    Dispense:  270 tablet    Refill:  3    Patient Instructions  Medication Instructions:  INCREASE Hydralazine to 50 mg 3 times a day  *If you need a refill on your cardiac medications before your next appointment, please call your pharmacy*   Lab Work: NONE ordered at this time of appointment    If you have labs (blood work) drawn today and your tests are completely normal, you will receive your results only by: [place] (if you have MyChart) OR A paper copy in the mail If you have any lab test that is abnormal or we need to change your treatment, we will call you to review the results.   Testing/Procedures: Your physician has requested that you have a lexiscan myoview. For further information please visit [email or web address]. Please follow instruction sheet, as given.  Previous order placed   Follow-Up: At [facility], you and your health  needs are our priority.  As part of our continuing mission to provide you with exceptional heart care, we have created designated Provider Care Teams.  These Care Teams include your primary Cardiologist (physician) and Advanced Practice Providers (APPs -  Physician Assistants and Nurse Practitioners) who all work together to provide you with the care you need, when you need it.  We recommend signing up for the patient portal called "MyChart".  Sign up information is provided on this After Visit Summary.  MyChart is used to connect with patients for Virtual Visits (Telemedicine).  Patients are able to view lab/test results, encounter notes, upcoming appointments, etc.  Non-urgent messages can be sent to your provider as well.   To learn more about what you can do with MyChart, go to [email or web address].    Your next appointment:   Pending the results of stress test    The format for your next appointment:   In Person  Provider:   Kirk Ruths, MD or Almyra Deforest, PA-C  Other Instructions    Signed, Almyra Deforest, [place]  11/17/2020 10:52 PM    [place]

## 2020-11-15 NOTE — Telephone Encounter (Signed)
Please advise 

## 2020-11-17 ENCOUNTER — Encounter: Payer: Self-pay | Admitting: Physician Assistant

## 2020-11-18 ENCOUNTER — Telehealth (HOSPITAL_COMMUNITY): Payer: Self-pay

## 2020-11-18 NOTE — Telephone Encounter (Signed)
Close encounter 

## 2020-11-22 ENCOUNTER — Ambulatory Visit (INDEPENDENT_AMBULATORY_CARE_PROVIDER_SITE_OTHER): Payer: 59 | Admitting: Podiatry

## 2020-11-22 ENCOUNTER — Other Ambulatory Visit: Payer: Self-pay

## 2020-11-22 ENCOUNTER — Ambulatory Visit (INDEPENDENT_AMBULATORY_CARE_PROVIDER_SITE_OTHER): Payer: 59

## 2020-11-22 DIAGNOSIS — Z9889 Other specified postprocedural states: Secondary | ICD-10-CM | POA: Diagnosis not present

## 2020-11-23 ENCOUNTER — Ambulatory Visit (HOSPITAL_COMMUNITY): Admission: RE | Admit: 2020-11-23 | Payer: 59 | Source: Ambulatory Visit | Attending: Cardiology | Admitting: Cardiology

## 2020-11-28 ENCOUNTER — Encounter (HOSPITAL_COMMUNITY): Payer: Self-pay | Admitting: Cardiology

## 2020-12-02 NOTE — Progress Notes (Signed)
  Subjective:  Patient ID: Javier Hunt., male    DOB: 15-Sep-1975,  MRN: [id number]  Chief Complaint  Patient presents with   Routine Post Op    pov#6 DOS 10/12/2020 RT FOOT/ANKLE TIBIALTALO CALCANEAL FUSION W/PLATE OR ROD FIXATION W/HARVEST OF BONE GRAFT AS INDICATED, POSS FUSION OF MIDFOOT JOINTS INCLUDING MEDIAL COLUMN FUSION Doing well. Denies f/c/n/v. Manageable to no pain.    DOS: 10/12/20 Procedure: Right TTC fusion  45 y.o. male presents with the above complaint. History confirmed with patient.  Objective:  Physical Exam: tenderness at the surgical site, local edema noted and calf supple, nontender. Incision: healed proximally. Distal wound with superficial fibrosis. No tunneling or probing. No warmth, erythema, SOI.   Assessment:   1. Post-operative state    Plan:  Patient was evaluated and treated and all questions answered.  Post-operative State -X-ray with stable changes with signs of healing -All sutures and staples removed.  Continue wet-to-dry dressing daily -Continue NWB -XRs needed at follow-up: 2 view Foot and 2 view Ankle  No follow-ups on file.

## 2020-12-09 ENCOUNTER — Ambulatory Visit (INDEPENDENT_AMBULATORY_CARE_PROVIDER_SITE_OTHER): Payer: 59

## 2020-12-09 ENCOUNTER — Ambulatory Visit (INDEPENDENT_AMBULATORY_CARE_PROVIDER_SITE_OTHER): Payer: 59 | Admitting: Podiatry

## 2020-12-09 ENCOUNTER — Other Ambulatory Visit: Payer: Self-pay

## 2020-12-09 ENCOUNTER — Telehealth (HOSPITAL_COMMUNITY): Payer: Self-pay | Admitting: Cardiology

## 2020-12-09 DIAGNOSIS — M14671 Charcot's joint, right ankle and foot: Secondary | ICD-10-CM | POA: Diagnosis not present

## 2020-12-09 DIAGNOSIS — M868X7 Other osteomyelitis, ankle and foot: Secondary | ICD-10-CM

## 2020-12-09 DIAGNOSIS — E11621 Type 2 diabetes mellitus with foot ulcer: Secondary | ICD-10-CM

## 2020-12-09 DIAGNOSIS — L97411 Non-pressure chronic ulcer of right heel and midfoot limited to breakdown of skin: Secondary | ICD-10-CM

## 2020-12-09 NOTE — Telephone Encounter (Signed)
Just an FYI. We have made several attempts to contact this patient including sending a letter to schedule or reschedule their Myoview. We will be removing the patient from the echo/Nuc WQ.   11/23/20 NO SHOWED-MAILED LETTER [facility]       Thank you

## 2020-12-09 NOTE — Progress Notes (Signed)
  Subjective:  Patient ID: Javier Hunt., male    DOB: [birth year],  MRN: [id number]  Chief Complaint  Patient presents with   Routine Post Op    POV#7 Denies fever/chills/nausea/vomiting. Pt states he has no concerns and is doing well.   DOS: 10/12/20 Procedure: Right TTC fusion  45 y.o. male presents with the above complaint. History confirmed with patient. Doing well, pain controlled. Wife has been applying wet to dry dressing daily and states the incision is doing better. Objective:  Physical Exam: tenderness at the surgical site, local edema noted and calf supple, nontender. Incision: healed proximally. Distal wound with superficial fibrosis measuring 6x0.6. No tunneling or probing. No warmth, erythema, SOI.   Radiographs: foot and ankle views today: Alignment maintained. Bridging noted across the subtalar joint. Hardware intact without failure. Assessment:   1. Charcot's joint of right foot   2. Other osteomyelitis of right foot ([place])   3. Diabetic ulcer of right heel associated with type 2 diabetes mellitus, limited to breakdown of skin ([place])    Plan:  Patient was evaluated and treated and all questions answered.  Post-operative State -X-ray with stable changes with signs of healing -Delayed wound healing improving. Start medihoney and band-aid daily. -Continue NWB. Continue knee scooter. -Once wound heals consider cast immobilization. -XRs needed at follow-up: none -F/u in 2 weeks for wound check. Plan for new XR in 4 weeks.  No follow-ups on file.

## 2020-12-13 ENCOUNTER — Telehealth (HOSPITAL_COMMUNITY): Payer: Self-pay | Admitting: *Deleted

## 2020-12-13 NOTE — Telephone Encounter (Signed)
Close encounter 

## 2020-12-15 ENCOUNTER — Ambulatory Visit (HOSPITAL_COMMUNITY)
Admission: RE | Admit: 2020-12-15 | Discharge: 2020-12-15 | Disposition: A | Discharge status: Home / Self Care | Payer: 59 | Source: Ambulatory Visit | Attending: Cardiovascular Disease | Admitting: Cardiovascular Disease

## 2020-12-15 ENCOUNTER — Other Ambulatory Visit: Payer: Self-pay

## 2020-12-15 DIAGNOSIS — R079 Chest pain, unspecified: Secondary | ICD-10-CM | POA: Insufficient documentation

## 2020-12-15 LAB — MYOCARDIAL PERFUSION IMAGING
LV dias vol: 139 mL (ref 62–150)
LV sys vol: 74 mL
Peak HR: 81 {beats}/min
Rest HR: 66 {beats}/min
SDS: 1
SRS: 0
SSS: 1
TID: 1.13

## 2020-12-15 MED ORDER — REGADENOSON 0.4 MG/5ML IV SOLN
0.4000 mg | Freq: Once | INTRAVENOUS | Status: AC
  Administered 2020-12-15: 0.4 mg via INTRAVENOUS

## 2020-12-15 MED ORDER — AMINOPHYLLINE 25 MG/ML IV SOLN
75.0000 mg | Freq: Once | INTRAVENOUS | Status: AC
  Administered 2020-12-15: 75 mg via INTRAVENOUS

## 2020-12-15 MED ORDER — TECHNETIUM TC 99M TETROFOSMIN IV KIT
31.9000 | PACK | Freq: Once | INTRAVENOUS | Status: AC | PRN
  Administered 2020-12-15: 31.9 via INTRAVENOUS
  Filled 2020-12-15: qty 32

## 2020-12-15 MED ORDER — TECHNETIUM TC 99M TETROFOSMIN IV KIT
9.8000 | PACK | Freq: Once | INTRAVENOUS | Status: AC | PRN
  Administered 2020-12-15: 9.8 via INTRAVENOUS
  Filled 2020-12-15: qty 10

## 2020-12-23 ENCOUNTER — Other Ambulatory Visit: Payer: Self-pay

## 2020-12-23 ENCOUNTER — Ambulatory Visit (INDEPENDENT_AMBULATORY_CARE_PROVIDER_SITE_OTHER): Payer: 59 | Admitting: Podiatry

## 2020-12-23 DIAGNOSIS — M14671 Charcot's joint, right ankle and foot: Secondary | ICD-10-CM | POA: Diagnosis not present

## 2020-12-23 DIAGNOSIS — Z9889 Other specified postprocedural states: Secondary | ICD-10-CM

## 2020-12-23 DIAGNOSIS — M868X7 Other osteomyelitis, ankle and foot: Secondary | ICD-10-CM

## 2020-12-23 NOTE — Progress Notes (Signed)
  Subjective:  Patient ID: Javier Hunt., male    DOB: [birth year],  MRN: [id number]  Chief Complaint  Patient presents with   Routine Post Op    POV # 8 -pt deneis N/V/F/CH -pt states," foot is about the same," - no pain -pt denies draiange -w/ swelling and less redness Tx: medihoney dressing daily, boot, scooter   DOS: 10/12/20 Procedure: Right TTC fusion  45 y.o. male presents with the above complaint. History confirmed with patient.   Objective:  Physical Exam: tenderness at the surgical site, local edema noted and calf supple, nontender. Incision: healed proximally. Distal wound with superficial fibrosis measuring 3x0.2. No warmth, erythema, SOI.  Assessment:   1. Charcot's joint of right foot   2. Other osteomyelitis of right foot ([place])   3. Post-operative state    Plan:  Patient was evaluated and treated and all questions answered.  Post-operative State -Wound continues to improve, dry mostly granular wound. Dressed with iodosorb and border dressing -Continue NWB. Continue knee scooter.  -XRs needed at follow-up: 2 view foot, 2 view ankle after removal of cast -Potentially reapply cast next visit.  Return in about 2 weeks (around 01/06/2021) for Post-Op (with XRs) - after cast removal.

## 2021-01-06 ENCOUNTER — Ambulatory Visit (INDEPENDENT_AMBULATORY_CARE_PROVIDER_SITE_OTHER): Payer: 59

## 2021-01-06 ENCOUNTER — Other Ambulatory Visit: Payer: Self-pay

## 2021-01-06 ENCOUNTER — Ambulatory Visit (INDEPENDENT_AMBULATORY_CARE_PROVIDER_SITE_OTHER): Payer: 59 | Admitting: Podiatry

## 2021-01-06 DIAGNOSIS — Z9889 Other specified postprocedural states: Secondary | ICD-10-CM

## 2021-01-06 DIAGNOSIS — M868X7 Other osteomyelitis, ankle and foot: Secondary | ICD-10-CM | POA: Diagnosis not present

## 2021-01-06 DIAGNOSIS — M14671 Charcot's joint, right ankle and foot: Secondary | ICD-10-CM

## 2021-01-06 NOTE — Progress Notes (Signed)
  Subjective:  Patient ID: Javier Hunt., male    DOB: 1975/07/18,  MRN: [id number]  No chief complaint on file.  DOS: 10/12/20 Procedure: Right TTC fusion  45 y.o. male presents with the above complaint. History confirmed with patient. Denies pain. Denies issues with the cast. Objective:  Physical Exam: no tenderness at the surgical site, local edema noted, and calf supple, nontender. Ankle and hindfoot clinically stiff.  Incision: healed proximally. Superficial superior wound dehiscence x2 measuring 0.3x0.3, 0.2x0.2. No warmth, erythema, SOI.  Assessment:   1. Post-operative state   2. Charcot's joint of right foot   3. Other osteomyelitis of right foot ([place])   4. Charcot ankle, right    Plan:  Patient was evaluated and treated and all questions answered.  Post-operative State -Minor superficial dehiscence remains at superior wound margin. Dressed with iodosorb and silicone foam border dressing today. WTD daily with moist gauze, dry gauze, tape. -The STJ appears fused, ankle with some bridging but not full bridging. Will order bone stim next visit if needed. -Continue NWB. Continue knee scooter.  -XRs needed at follow-up: 2 view foot (AP Foot, Lat foot), 2 view ankle (Calc axial, AP ankle)   Return in about 2 weeks (around 01/20/2021) for Post-Op (with XRs).

## 2021-01-20 ENCOUNTER — Ambulatory Visit (INDEPENDENT_AMBULATORY_CARE_PROVIDER_SITE_OTHER): Payer: 59

## 2021-01-20 ENCOUNTER — Ambulatory Visit: Payer: 59

## 2021-01-20 ENCOUNTER — Ambulatory Visit (INDEPENDENT_AMBULATORY_CARE_PROVIDER_SITE_OTHER): Payer: 59 | Admitting: Podiatry

## 2021-01-20 ENCOUNTER — Other Ambulatory Visit: Payer: Self-pay

## 2021-01-20 DIAGNOSIS — M14671 Charcot's joint, right ankle and foot: Secondary | ICD-10-CM

## 2021-01-20 DIAGNOSIS — M960 Pseudarthrosis after fusion or arthrodesis: Secondary | ICD-10-CM | POA: Diagnosis not present

## 2021-01-20 NOTE — Progress Notes (Signed)
  Subjective:  Patient ID: Javier November., male    DOB: Jun 30, 1975,  MRN: [id number]  Chief Complaint  Patient presents with   Wound Check    POV No new concerns, denies fever/chills/nausea/vomiting.    DOS: 10/12/20 Procedure: Right TTC fusion  45 y.o. male presents with the above complaint. History confirmed with patient. Denies pain, states the wound is doing better. Objective:  Physical Exam: no tenderness at the surgical site, local edema noted, and calf supple, nontender. Ankle and hindfoot clinically stiff.  Incision: now fully healed. No warmth, erythema, SOI  Radiographs: 2 views foot, 2 views ankle taken and reviewed. Continued nonunion of the ankle arthrodesis site with 4 mm gapping. Subtalar bridging noted. Assessment:   1. Charcot's joint of right foot   2. Nonunion of osteotomy site    Plan:  Patient was evaluated and treated and all questions answered.  Post-operative State -Dehiscence has healed.  -Repeat XR show continued delay of the ankle joint. Will order bone stim for further healing prior to initiating WB  -XRs needed at follow-up: 2 view foot (AP Foot, Lat foot), 2 view ankle (Calc axial, AP ankle)   Return in about 3 weeks (around 02/10/2021) for Post-Op (with XRs).

## 2021-02-10 ENCOUNTER — Encounter: Payer: 59 | Admitting: Podiatry

## 2021-02-21 ENCOUNTER — Ambulatory Visit (INDEPENDENT_AMBULATORY_CARE_PROVIDER_SITE_OTHER): Payer: 59

## 2021-02-21 ENCOUNTER — Other Ambulatory Visit: Payer: Self-pay

## 2021-02-21 ENCOUNTER — Ambulatory Visit (INDEPENDENT_AMBULATORY_CARE_PROVIDER_SITE_OTHER): Payer: 59 | Admitting: Podiatry

## 2021-02-21 DIAGNOSIS — M14671 Charcot's joint, right ankle and foot: Secondary | ICD-10-CM

## 2021-02-21 NOTE — Progress Notes (Signed)
  Subjective:  Patient ID: Javier Hunt., male    DOB: [birth year],  MRN: [id number]  Chief Complaint  Patient presents with   Routine Post Op       w XRAY pov#11 DOS 10/12/2020 RT FOOT/ANKLE TIBIALTALO CALCANEAL FUSION W/PLATE OR ROD FIXATION W/HARVEST OF BONE GRAFT AS INDICATED, POSS FUSION OF MIDFOOT JOINTS INCLUDING MEDIAL COLUMN FUSION   DOS: 10/12/20 Procedure: Right TTC fusion  45 y.o. male presents with the above complaint. History confirmed with patient. Doing well, denies interval changes. Has been NWB with the scooter as directed. Objective:  Physical Exam: no tenderness at the surgical site, local edema noted, and calf supple, nontender. Ankle and hindfoot clinically stiff.  Incision: now fully healed. No warmth, erythema, SOI  Radiographs: 2 views foot, 2 views ankle taken and reviewed. Subtalar bridging noted and bridging of the ankle noted on AP and oblique views Assessment:   1. Charcot's joint of right foot    Plan:  Patient was evaluated and treated and all questions answered.  Post-operative State -Xrs reviewed. Signs of osseous healing on AP and oblique, less bridging noted on lateral. Will progress to WB. Discussed slow transition and prompt reporting of pain, significantly worsened edema, warmth. Patient to WB in boot only -Continue bone stim to allow further bone maturation. -XRs needed at follow-up: 2 view foot (AP Foot, Lat foot), 2 view ankle (Calc axial, AP ankle)   Return in about 3 weeks (around 03/14/2021) for Post-Op (with XRs).

## 2021-03-31 ENCOUNTER — Ambulatory Visit (INDEPENDENT_AMBULATORY_CARE_PROVIDER_SITE_OTHER): Payer: 59

## 2021-03-31 ENCOUNTER — Other Ambulatory Visit: Payer: Self-pay

## 2021-03-31 ENCOUNTER — Ambulatory Visit (INDEPENDENT_AMBULATORY_CARE_PROVIDER_SITE_OTHER): Payer: 59 | Admitting: Podiatry

## 2021-03-31 DIAGNOSIS — E11621 Type 2 diabetes mellitus with foot ulcer: Secondary | ICD-10-CM | POA: Diagnosis not present

## 2021-03-31 DIAGNOSIS — Z9889 Other specified postprocedural states: Secondary | ICD-10-CM

## 2021-03-31 DIAGNOSIS — L97411 Non-pressure chronic ulcer of right heel and midfoot limited to breakdown of skin: Secondary | ICD-10-CM

## 2021-03-31 DIAGNOSIS — M14671 Charcot's joint, right ankle and foot: Secondary | ICD-10-CM

## 2021-03-31 NOTE — Progress Notes (Signed)
  Subjective:  Patient ID: Javier Hunt., male    DOB: Jul 14, 1975,  MRN: [id number]  Chief Complaint  Patient presents with   Routine Post Op    Right foot and ankle post-op    DOS: 10/12/20 Procedure: Right TTC fusion  45 y.o. male presents with the above complaint. History confirmed with patient. States he is doing well, no frank pain but sometimes tendernes to the plantar heel. States the heel ulcer has come back and has been present for about a week. Denies N/V/F/Ch. Objective:  Physical Exam: no tenderness at the surgical site, local edema noted, and calf supple, nontender. Ankle and hindfoot clinically stiff.  Incision: now fully healed. No warmth, erythema, SOI  Radiographs: 2 views foot, 2 views ankle taken and reviewed. Subtalar bridging noted and bridging of the ankle noted on AP and oblique views, progressing since previous. Backing out of screw at talus. Assessment:   1. Post-operative state   2. Charcot's joint of right foot   3. Diabetic ulcer of right heel associated with type 2 diabetes mellitus, limited to breakdown of skin ([place])    Plan:  Patient was evaluated and treated and all questions answered.  Post-operative State -Xrs reviewed. Further consolidation noted, though with backing out of one of the screws. This does not appear fully backed out. Given progressing consolidation with WB will monitor for further backing out and removal -He has had recurrence of ulceration to the plantar heel. The bone does not appear prominent on XR. May be more due to previous ulcer and minimal soft tissue coverage at this area. PegAssist added to patient's boot. -Continue bone stim to allow further bone maturation. -XRs needed at follow-up: 2 view foot (AP Foot, Lat foot), 2 view ankle (Calc axial, AP ankle)   No follow-ups on file.

## 2021-04-14 ENCOUNTER — Ambulatory Visit (INDEPENDENT_AMBULATORY_CARE_PROVIDER_SITE_OTHER): Payer: 59

## 2021-04-14 ENCOUNTER — Ambulatory Visit (INDEPENDENT_AMBULATORY_CARE_PROVIDER_SITE_OTHER): Payer: 59 | Admitting: Podiatry

## 2021-04-14 ENCOUNTER — Other Ambulatory Visit: Payer: Self-pay

## 2021-04-14 DIAGNOSIS — L97411 Non-pressure chronic ulcer of right heel and midfoot limited to breakdown of skin: Secondary | ICD-10-CM | POA: Diagnosis not present

## 2021-04-14 DIAGNOSIS — Z9889 Other specified postprocedural states: Secondary | ICD-10-CM

## 2021-04-14 DIAGNOSIS — M14671 Charcot's joint, right ankle and foot: Secondary | ICD-10-CM

## 2021-04-14 DIAGNOSIS — E11621 Type 2 diabetes mellitus with foot ulcer: Secondary | ICD-10-CM | POA: Diagnosis not present

## 2021-04-14 MED ORDER — SILVER SULFADIAZINE 1 % EX CREA
TOPICAL_CREAM | CUTANEOUS | 0 refills | Status: DC
Start: 2021-04-14 — End: 2023-09-10

## 2021-04-17 NOTE — Progress Notes (Signed)
  Subjective:  Patient ID: Javier Hunt., male    DOB: Jan 27, 1976,  MRN: [id number]  Chief Complaint  Patient presents with   Routine Post Op    DOS 10/12/2020 RT FOOT/ANKLE TIBIALTALO CALCANEAL FUSION W/PLATE OR ROD FIXATION W/HARVEST OF BONE GRAFT AS INDICATED, POSS FUSION OF MIDFOOT JOINTS INCLUDING MEDIAL COLUMN FUSION. Pt states there has been drainage and discoloration of wound. I believe that the blue discoloration is from the insert in the AFW.    DOS: 10/12/20 Procedure: Right TTC fusion  45 y.o. male presents with the above complaint. History confirmed with patient. Thinks the wound is a little bigger, has been WB in his boot as directed. Objective:  Physical Exam: no tenderness at the surgical site, local edema noted, and calf supple, nontender. Ankle and hindfoot with some motion appreciated. Incision: now fully healed. No warmth, erythema, SOI  Radiographs: 2 views foot, 2 views ankle taken and reviewed. Subtalar bridging noted and bridging of the ankle noted on AP and oblique views, slight progression of backing out of screw at talus. Assessment:   1. Post-operative state   2. Charcot's joint of right foot   3. Diabetic ulcer of right heel associated with type 2 diabetes mellitus, limited to breakdown of skin ([place])    Plan:  Patient was evaluated and treated and all questions answered.  Post-operative State -Repeat Xrs reviewed, screw does not appear to have significantly backed out. -Ankle no longer clinically stiff as it was previously. Will halt WB and resume knee scooter. -Continue bone stim to allow further bone maturation. -Wound worsened but well appearing. Rx silvadene cream to apply to wound daily. No signs of wound infection. -XRs needed at follow-up: none  Return in about 11 days (around 04/25/2021).

## 2021-04-25 ENCOUNTER — Other Ambulatory Visit: Payer: Self-pay

## 2021-04-25 ENCOUNTER — Ambulatory Visit (INDEPENDENT_AMBULATORY_CARE_PROVIDER_SITE_OTHER): Payer: 59 | Admitting: Podiatry

## 2021-04-25 DIAGNOSIS — E11621 Type 2 diabetes mellitus with foot ulcer: Secondary | ICD-10-CM

## 2021-04-25 DIAGNOSIS — L97411 Non-pressure chronic ulcer of right heel and midfoot limited to breakdown of skin: Secondary | ICD-10-CM | POA: Diagnosis not present

## 2021-04-25 NOTE — Progress Notes (Signed)
  Subjective:  Patient ID: Javier Hunt., male    DOB: Hunt 20, 1977,  MRN: [id number]  Chief Complaint  Patient presents with   Routine Post Op    Wound check.    DOS: 10/12/20 Procedure: Right TTC fusion  45 y.o. male presents with the above complaint. History confirmed with patient. States the wound is doing better, has been dressing as directed.  Objective:  Physical Exam: no tenderness at the surgical site, local edema noted, and calf supple, nontender. Ankle and hindfoot with some motion appreciated. Incision: remains healed. 1.5x1 ulcer plantar heel without warmth, erythema, SOI. Assessment:   1. Diabetic ulcer of right heel associated with type 2 diabetes mellitus, limited to breakdown of skin ([place])    Plan:  Patient was evaluated and treated and all questions answered.  Post-operative State -Wound gently cleansed and debrided, non-procedural -Ankle more clinically stiff today. Continue course of NWB -Continue bone stim to allow further bone maturation. -XRs needed at follow-up: 2v foot, 2v ankle  Return in about 3 weeks (around 05/16/2021) for Wound Care, with XRs.

## 2021-05-16 ENCOUNTER — Other Ambulatory Visit: Payer: Self-pay

## 2021-05-16 ENCOUNTER — Ambulatory Visit (INDEPENDENT_AMBULATORY_CARE_PROVIDER_SITE_OTHER): Payer: 59 | Admitting: Podiatry

## 2021-05-16 ENCOUNTER — Other Ambulatory Visit: Payer: Self-pay | Admitting: Podiatry

## 2021-05-16 ENCOUNTER — Ambulatory Visit (INDEPENDENT_AMBULATORY_CARE_PROVIDER_SITE_OTHER): Payer: 59

## 2021-05-16 DIAGNOSIS — L97411 Non-pressure chronic ulcer of right heel and midfoot limited to breakdown of skin: Secondary | ICD-10-CM

## 2021-05-16 DIAGNOSIS — E11621 Type 2 diabetes mellitus with foot ulcer: Secondary | ICD-10-CM

## 2021-05-16 DIAGNOSIS — L97419 Non-pressure chronic ulcer of right heel and midfoot with unspecified severity: Secondary | ICD-10-CM

## 2021-05-19 NOTE — Progress Notes (Signed)
°  Subjective:  Patient ID: Javier Hunt., male    DOB: 1975/09/10,  MRN: [id number]  Chief Complaint  Patient presents with   Routine Post Op    3 week f/u Wound Care   DOS: 10/12/20 Procedure: Right TTC fusion  45 y.o. male presents with the above complaint. History confirmed with patient. Unsure how wound is doing, maintaining NWB with scooter.  Objective:  Physical Exam: no tenderness at the surgical site, local edema noted, and calf supple, nontender. Ankle and hindfoot with some motion appreciated. Incision: remains healed. 0.5x0.5 ulcer plantar heel without warmth, erythema, SOI. Assessment:   1. Heel ulceration, right, with unspecified severity ([place])   2. Diabetic ulcer of right heel associated with type 2 diabetes mellitus, limited to breakdown of skin ([place])    Plan:  Patient was evaluated and treated and all questions answered.  Post-operative State -Wound continues to improve. Cauterized and dressed with foam border dressing -XR taken no interval change. Stable screw backout. -Ankle again more clinically stiff. Continue course of NWB -Continue bone stim to allow further bone maturation. -XRs needed at follow-up: none  Return in about 3 weeks (around 06/06/2021) for Wound Care.

## 2021-06-06 ENCOUNTER — Ambulatory Visit: Payer: 59 | Admitting: Podiatry

## 2021-06-08 ENCOUNTER — Other Ambulatory Visit: Payer: Self-pay

## 2021-06-08 MED ORDER — HYDRALAZINE HCL 50 MG PO TABS: 50.0000 mg | ORAL_TABLET | Freq: Three times a day (TID) | ORAL | 3 refills | Status: DC

## 2021-06-13 ENCOUNTER — Ambulatory Visit: Payer: 59 | Admitting: Podiatry

## 2021-06-23 ENCOUNTER — Ambulatory Visit: Payer: 59 | Admitting: Podiatry

## 2021-06-27 ENCOUNTER — Ambulatory Visit (INDEPENDENT_AMBULATORY_CARE_PROVIDER_SITE_OTHER): Payer: 59 | Admitting: Podiatry

## 2021-06-27 ENCOUNTER — Other Ambulatory Visit: Payer: Self-pay

## 2021-06-27 DIAGNOSIS — Z9889 Other specified postprocedural states: Secondary | ICD-10-CM | POA: Diagnosis not present

## 2021-06-27 DIAGNOSIS — L97411 Non-pressure chronic ulcer of right heel and midfoot limited to breakdown of skin: Secondary | ICD-10-CM

## 2021-06-27 DIAGNOSIS — L97419 Non-pressure chronic ulcer of right heel and midfoot with unspecified severity: Secondary | ICD-10-CM | POA: Diagnosis not present

## 2021-06-27 DIAGNOSIS — E11621 Type 2 diabetes mellitus with foot ulcer: Secondary | ICD-10-CM | POA: Diagnosis not present

## 2021-06-27 NOTE — Progress Notes (Signed)
Walk  Subjective:  Patient ID: Javier Pilley., male    DOB: Dec 05, 1975,  MRN: [id number]  Chief Complaint  Patient presents with   Wound Check    Heel ulceration, right, with unspecified severity ([place])   Diabetic ulcer of right heel associated with type 2 diabetes mellitus, limited to breakdown of skin ([place])      DOS: 10/12/20 Procedure: Right TTC fusion  46 y.o. male presents with the above complaint. History confirmed with patient.  Thinks the wound is doing a lot better has been compliant with nonweightbearing with the knee scooter.  Denies pain  Objective:  Physical Exam: no tenderness at the surgical site, local edema noted, and calf supple, nontender. Ankle and hindfoot appear clinically stiff Incision: remains healed.  Pinpoint plantar heel ulcer without warmth, erythema, SOI. Assessment:   1. Heel ulceration, right, with unspecified severity ([place])   2. Diabetic ulcer of right heel associated with type 2 diabetes mellitus, limited to breakdown of skin ([place])   3. Post-operative state     Plan:  Patient was evaluated and treated and all questions answered.  Post-operative State -Wound further pinpoint today appears to be improving.  I think this point is reasonable to trial weightbearing again.  Should the wound recur advised him to stop weightbearing until he can be further evaluated -Ankle remains clinically stiff.  -Continue bone stim to allow further bone maturation. -XRs needed at follow-up: 3 view right foot 3 view right ankle -Should the ankle appear stable could consider triplanar brace to support the deformity  No follow-ups on file.

## 2021-07-21 ENCOUNTER — Ambulatory Visit: Payer: 59 | Admitting: Podiatry

## 2021-07-30 IMAGING — CR DG CHEST 1V
1 series · 1 of 1 positions shown · non-contrast
Comparison: August 12, 2019

CLINICAL DATA: Chest tightness

EXAM:
CHEST  1 VIEW

[w chest pa]
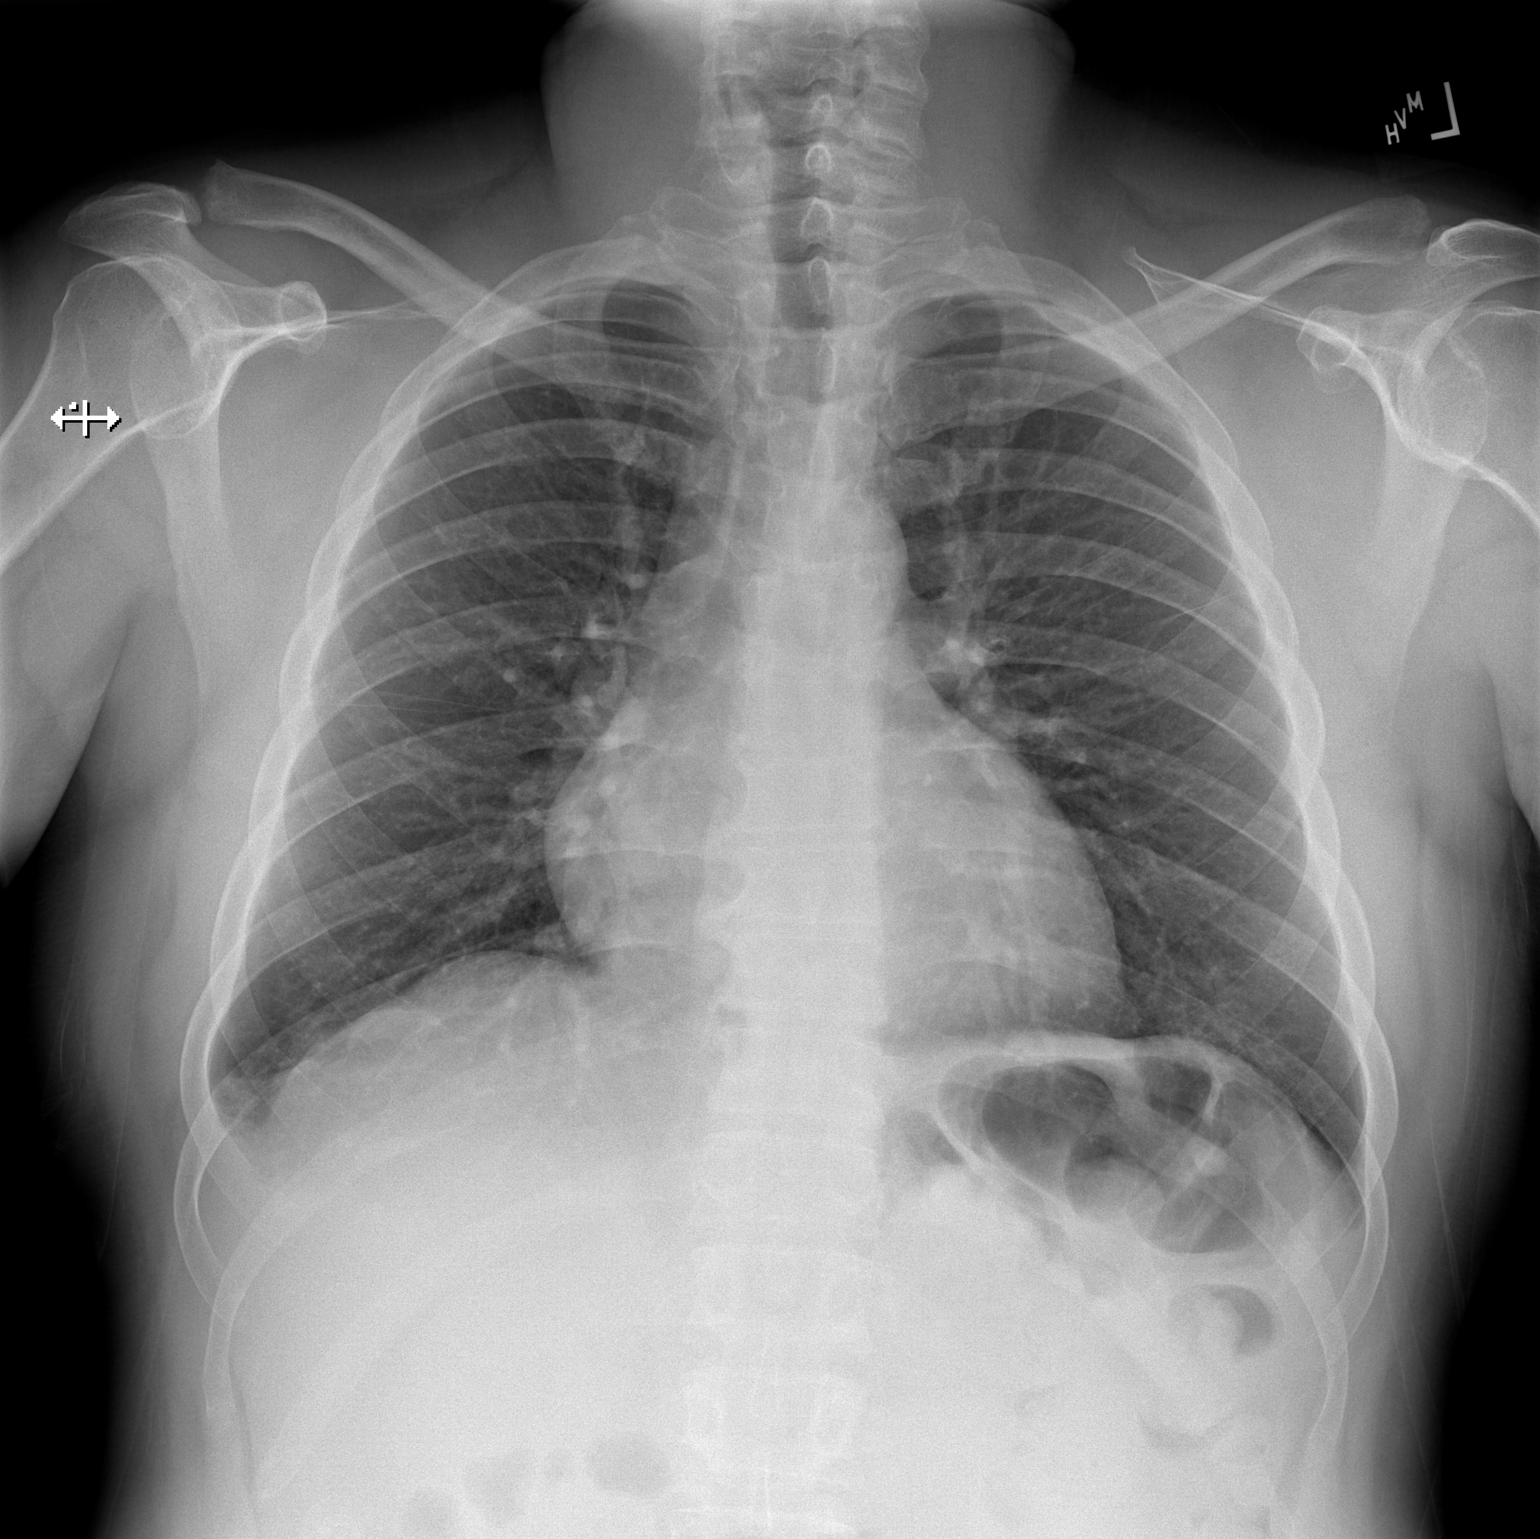

[1 of 1 positions shown; findings below may reference images not displayed]

FINDINGS: The cardiomediastinal silhouette is normal in contour. No pleural
effusion. No pneumothorax. No acute pleuroparenchymal abnormality.
Visualized abdomen is unremarkable. No acute osseous abnormality
noted.
IMPRESSION: No acute cardiopulmonary abnormality.

## 2021-07-30 IMAGING — CR DG ANKLE COMPLETE 3+V*R*
3 series · 3 of 3 positions shown · non-contrast
Comparison: 06/21/2020, CT 08/24/2020

CLINICAL DATA: Postop

EXAM:
RIGHT ANKLE - COMPLETE 3+ VIEW

[x ankle ap right]
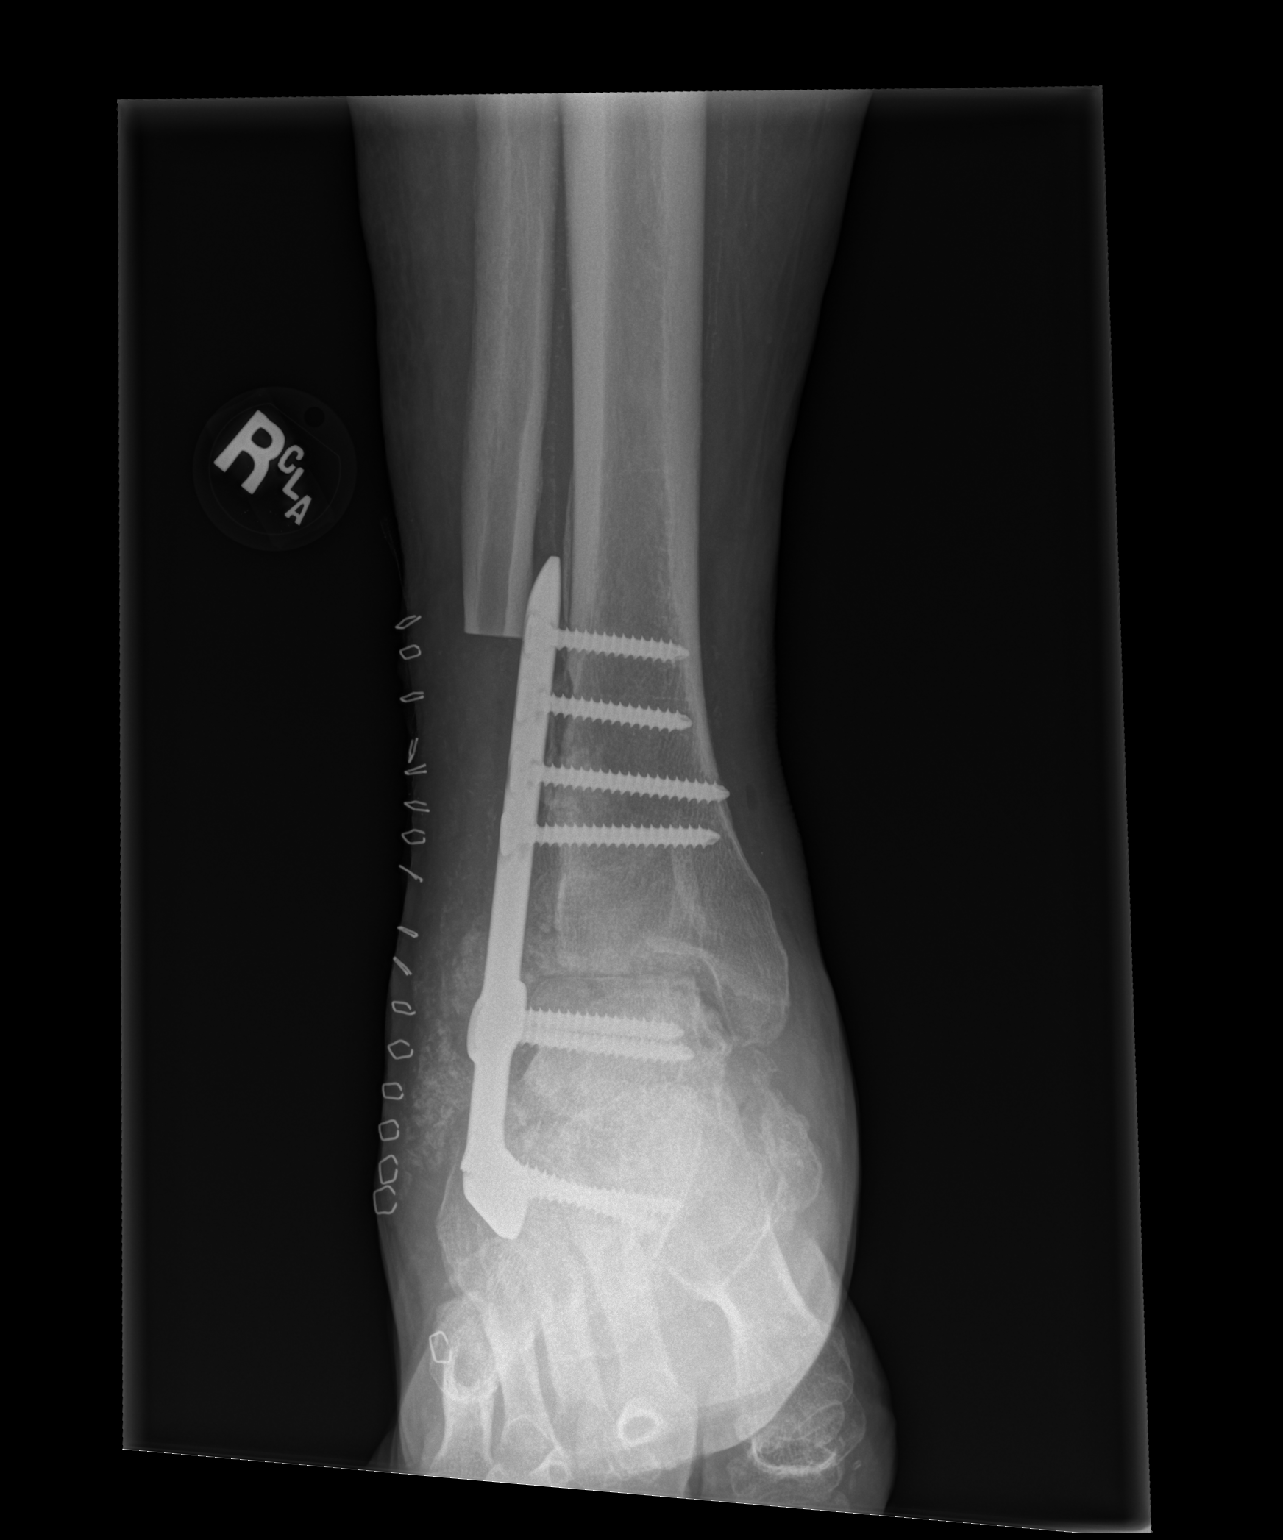

[x ankle obl right]
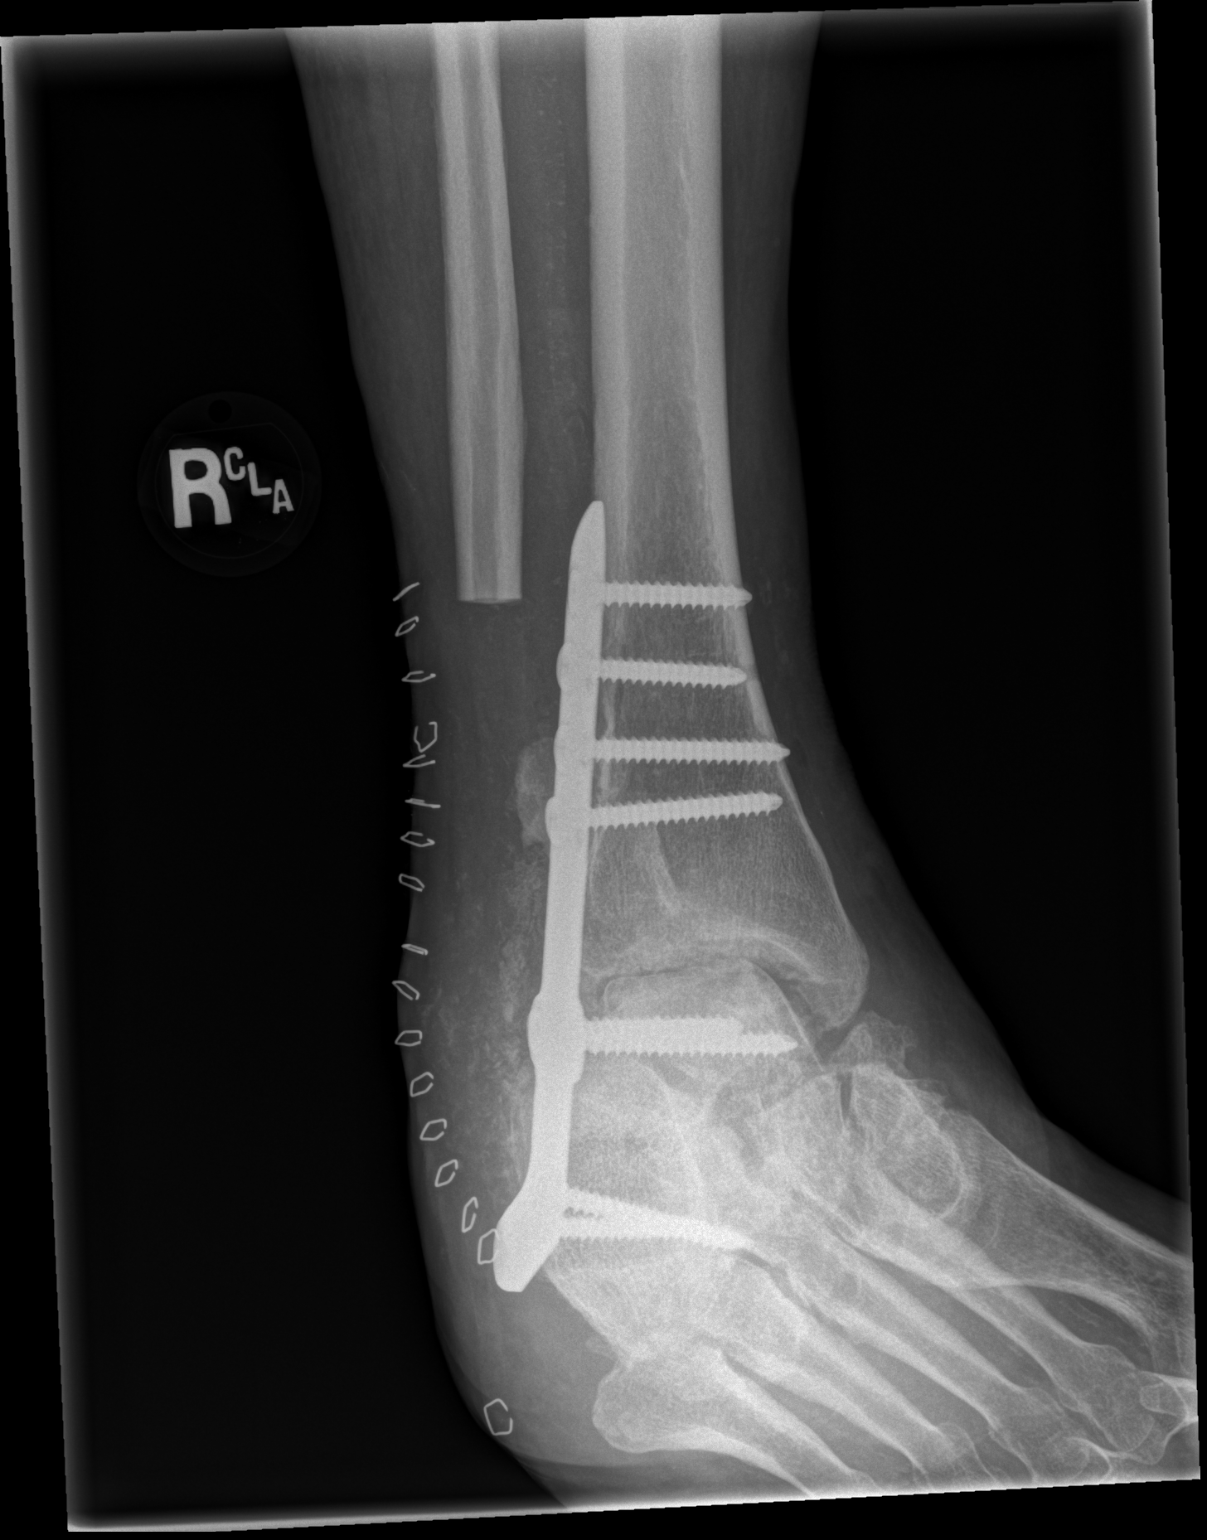

[x ankle lat right]
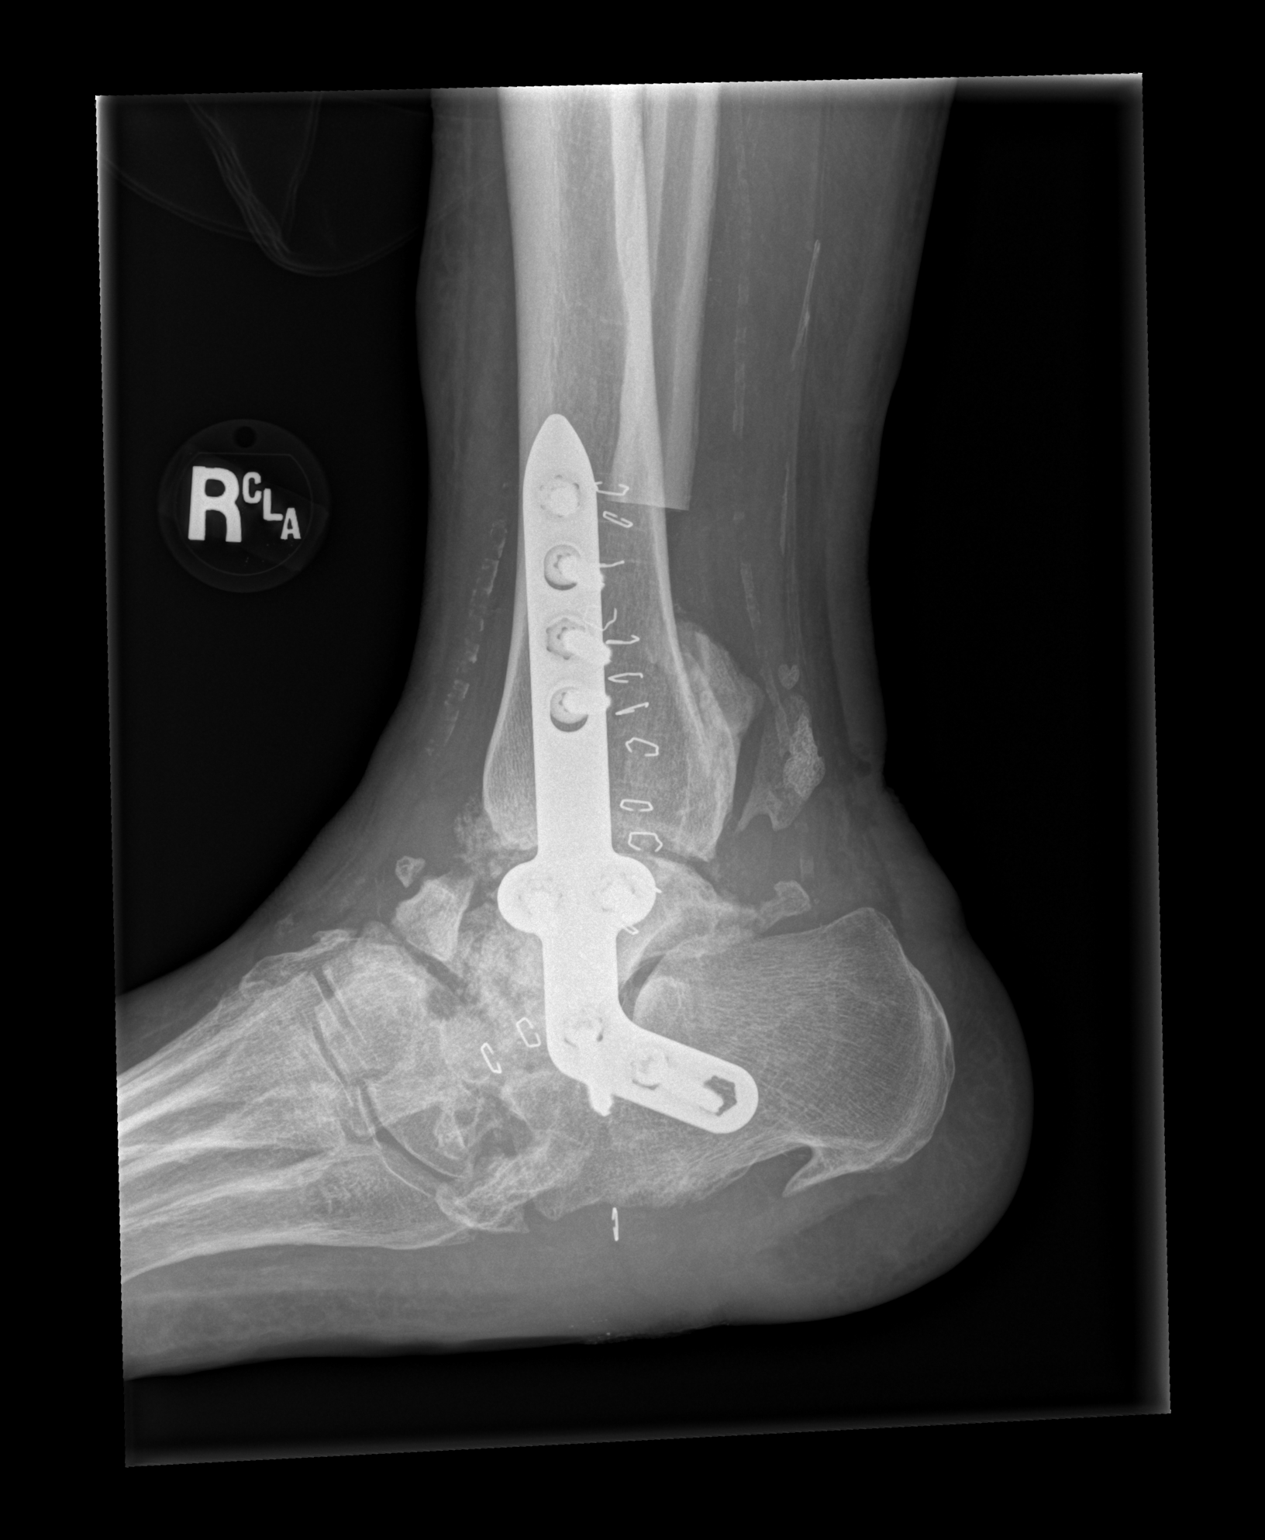

[3 of 3 positions shown; findings below may reference images not displayed]

FINDINGS: Interval resection of distal fibula with smooth appearing cut margin
of the fibula. Interval surgical plate and multiple screw fixation
of the tibia, talus, and calcaneus. Hardware appears grossly intact.
Redemonstrated Charcot arthropathy of the midfoot with bony
destructive change, fragmentation and collapse. Generalized soft
tissue swelling.
IMPRESSION: Interval operative changes of the left ankle and hindfoot including
resection of distal fibula and placement of fixating plate and
surgical screws extending from the distal tibia to the calcaneus.
Charcot arthropathy of the mid foot as before, though limits
assessment for acute process.

## 2021-07-30 IMAGING — CR DG FOOT COMPLETE 3+V*R*
3 series · 3 of 3 positions shown · non-contrast
Comparison: CT 06/26/2020, radiograph 06/21/2020

CLINICAL DATA: Postop fever

EXAM:
RIGHT FOOT COMPLETE - 3+ VIEW

[x foot obl right]
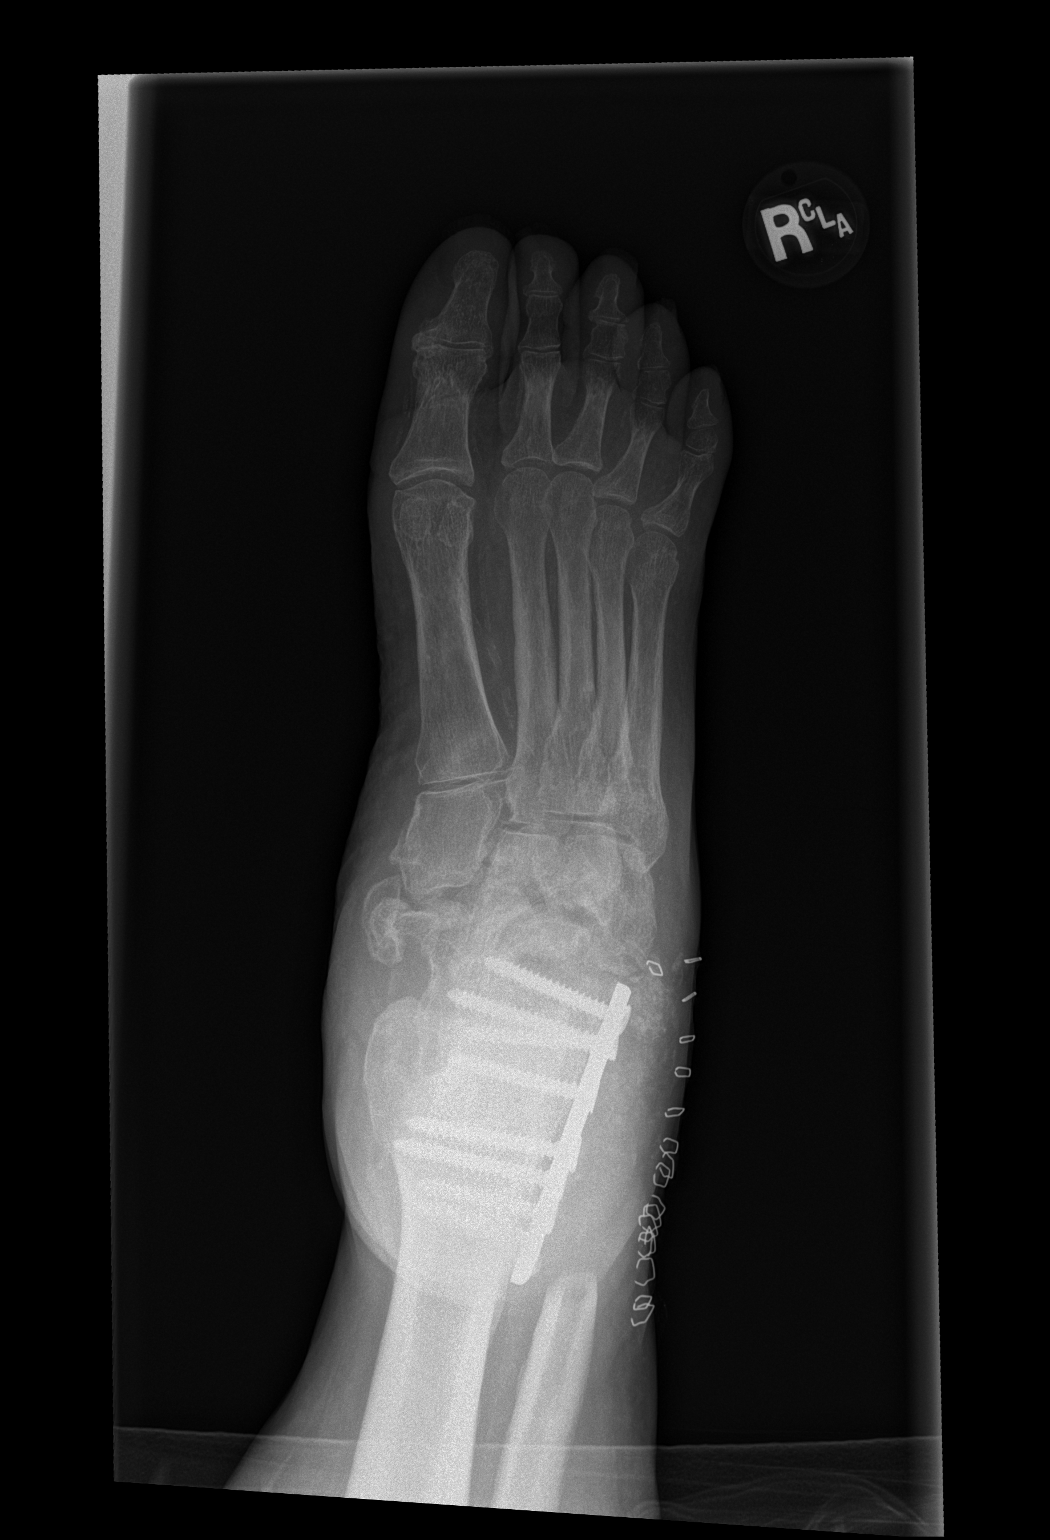

[x foot ap right]
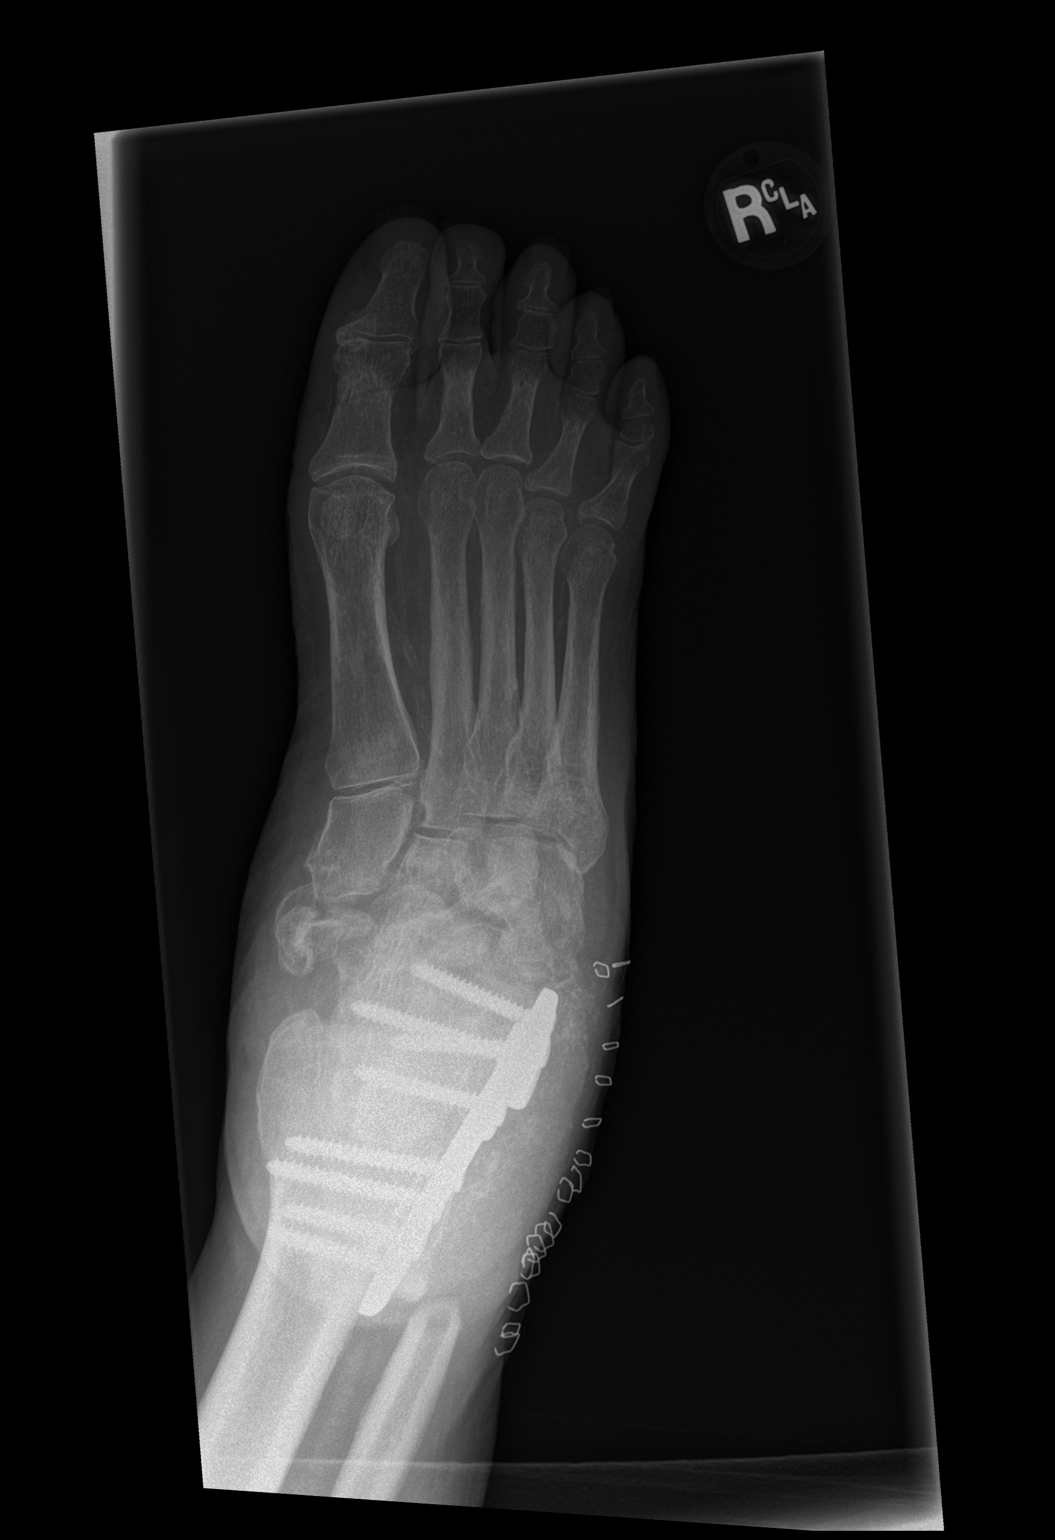

[x foot lat right]
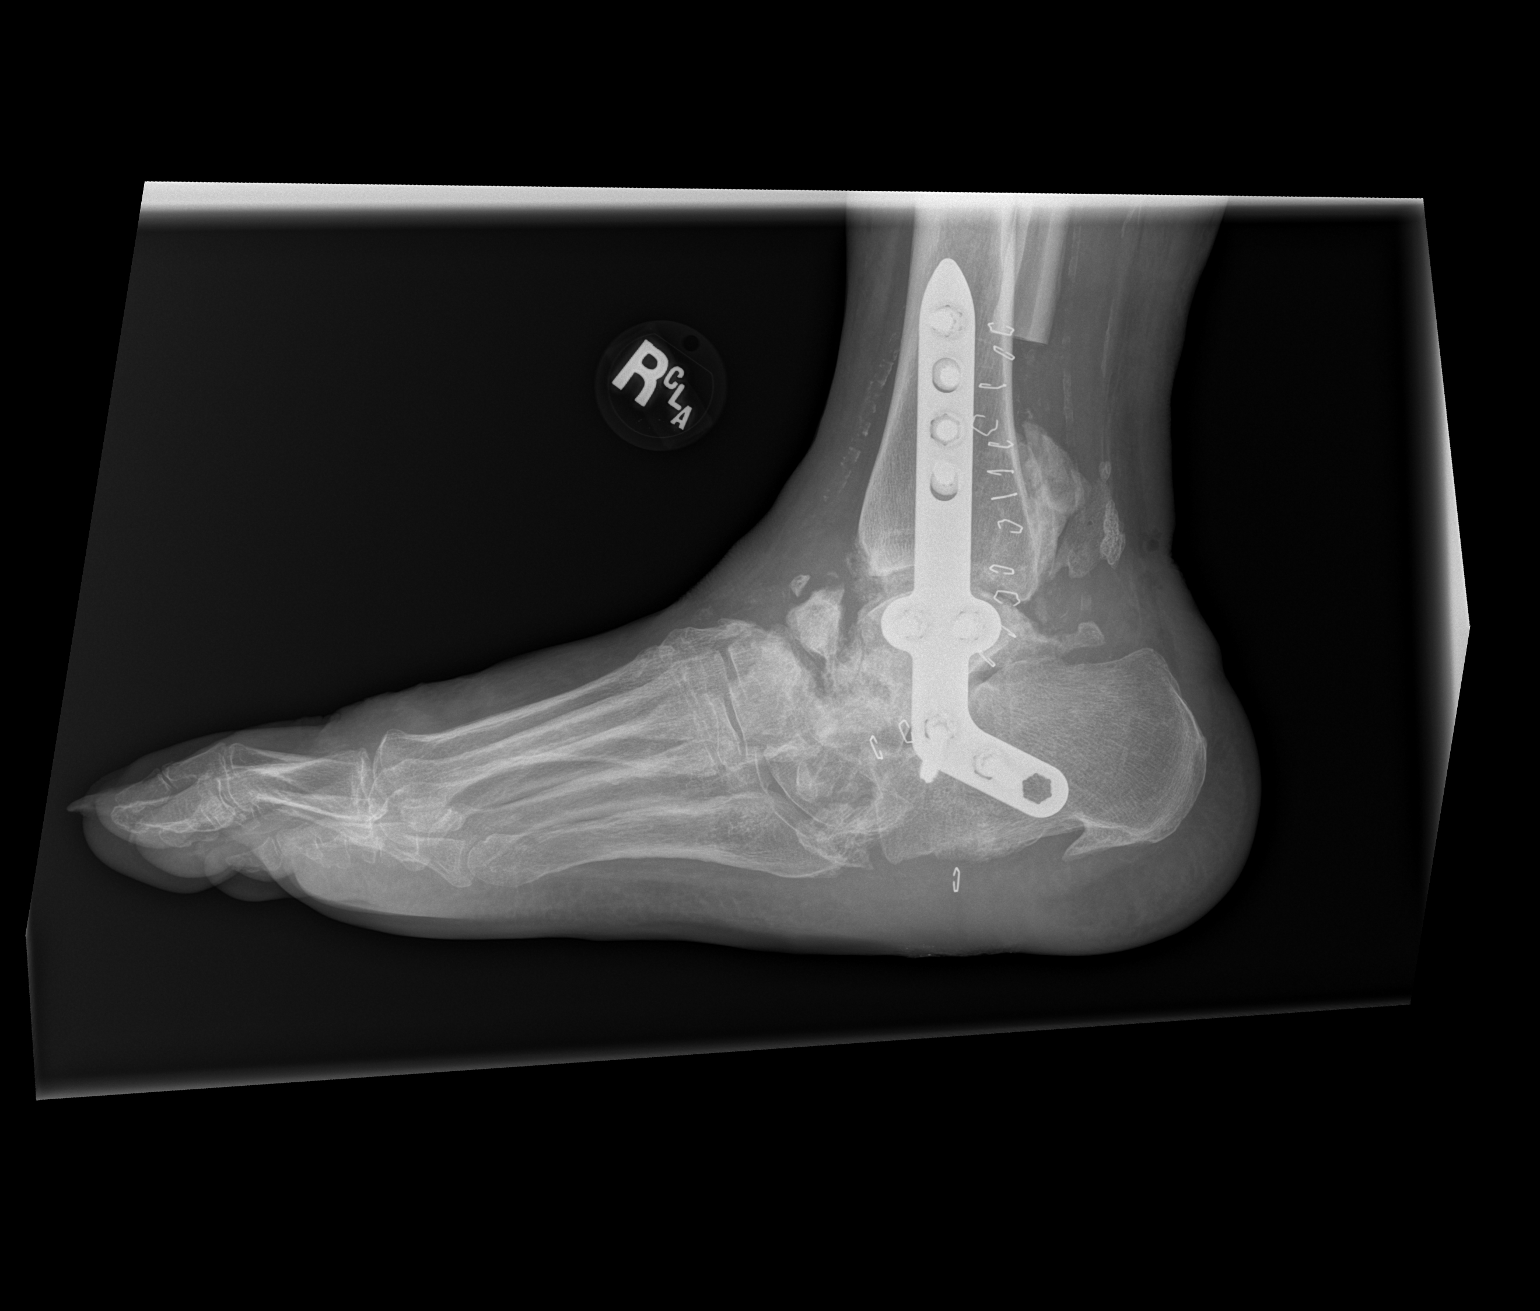

[3 of 3 positions shown; findings below may reference images not displayed]

FINDINGS: Soft tissue swelling is present. Charcot arthropathy of the midfoot.
Vascular calcifications. Osseous demineralization without frank
destructive change at the digits or metatarsals.
IMPRESSION: No definite acute osseous abnormality involving the digits or
metatarsals of the right foot

## 2021-09-06 LAB — HM DIABETES EYE EXAM

## 2022-06-08 DIAGNOSIS — E13621 Other specified diabetes mellitus with foot ulcer: Secondary | ICD-10-CM | POA: Diagnosis not present

## 2022-06-08 DIAGNOSIS — M14671 Charcot's joint, right ankle and foot: Secondary | ICD-10-CM | POA: Diagnosis not present

## 2022-07-30 DIAGNOSIS — N2581 Secondary hyperparathyroidism of renal origin: Secondary | ICD-10-CM | POA: Diagnosis not present

## 2022-07-30 DIAGNOSIS — M7989 Other specified soft tissue disorders: Secondary | ICD-10-CM | POA: Diagnosis not present

## 2022-07-30 DIAGNOSIS — E1129 Type 2 diabetes mellitus with other diabetic kidney complication: Secondary | ICD-10-CM | POA: Diagnosis not present

## 2022-07-30 DIAGNOSIS — N184 Chronic kidney disease, stage 4 (severe): Secondary | ICD-10-CM | POA: Diagnosis not present

## 2022-07-30 DIAGNOSIS — I129 Hypertensive chronic kidney disease with stage 1 through stage 4 chronic kidney disease, or unspecified chronic kidney disease: Secondary | ICD-10-CM | POA: Diagnosis not present

## 2022-07-30 DIAGNOSIS — R809 Proteinuria, unspecified: Secondary | ICD-10-CM | POA: Diagnosis not present

## 2022-07-30 DIAGNOSIS — E875 Hyperkalemia: Secondary | ICD-10-CM | POA: Diagnosis not present

## 2022-07-30 DIAGNOSIS — N179 Acute kidney failure, unspecified: Secondary | ICD-10-CM | POA: Diagnosis not present

## 2022-07-30 DIAGNOSIS — E785 Hyperlipidemia, unspecified: Secondary | ICD-10-CM | POA: Diagnosis not present

## 2022-08-01 DIAGNOSIS — E7800 Pure hypercholesterolemia, unspecified: Secondary | ICD-10-CM | POA: Diagnosis not present

## 2022-08-01 DIAGNOSIS — Z9641 Presence of insulin pump (external) (internal): Secondary | ICD-10-CM | POA: Diagnosis not present

## 2022-08-01 DIAGNOSIS — I10 Essential (primary) hypertension: Secondary | ICD-10-CM | POA: Diagnosis not present

## 2022-08-01 DIAGNOSIS — E1065 Type 1 diabetes mellitus with hyperglycemia: Secondary | ICD-10-CM | POA: Diagnosis not present

## 2022-08-10 DIAGNOSIS — M14671 Charcot's joint, right ankle and foot: Secondary | ICD-10-CM | POA: Diagnosis not present

## 2022-09-21 ENCOUNTER — Encounter (INDEPENDENT_AMBULATORY_CARE_PROVIDER_SITE_OTHER): Payer: 59 | Admitting: Ophthalmology

## 2022-09-21 DIAGNOSIS — H2513 Age-related nuclear cataract, bilateral: Secondary | ICD-10-CM

## 2022-09-21 DIAGNOSIS — H43813 Vitreous degeneration, bilateral: Secondary | ICD-10-CM

## 2022-09-21 DIAGNOSIS — I10 Essential (primary) hypertension: Secondary | ICD-10-CM | POA: Diagnosis not present

## 2022-09-21 DIAGNOSIS — E103593 Type 1 diabetes mellitus with proliferative diabetic retinopathy without macular edema, bilateral: Secondary | ICD-10-CM

## 2022-09-21 DIAGNOSIS — H35033 Hypertensive retinopathy, bilateral: Secondary | ICD-10-CM | POA: Diagnosis not present

## 2022-10-24 DIAGNOSIS — E1122 Type 2 diabetes mellitus with diabetic chronic kidney disease: Secondary | ICD-10-CM | POA: Diagnosis not present

## 2022-10-24 DIAGNOSIS — E785 Hyperlipidemia, unspecified: Secondary | ICD-10-CM | POA: Diagnosis not present

## 2022-10-24 DIAGNOSIS — E875 Hyperkalemia: Secondary | ICD-10-CM | POA: Diagnosis not present

## 2022-10-24 DIAGNOSIS — N179 Acute kidney failure, unspecified: Secondary | ICD-10-CM | POA: Diagnosis not present

## 2022-10-24 DIAGNOSIS — N184 Chronic kidney disease, stage 4 (severe): Secondary | ICD-10-CM | POA: Diagnosis not present

## 2022-10-24 DIAGNOSIS — R809 Proteinuria, unspecified: Secondary | ICD-10-CM | POA: Diagnosis not present

## 2022-10-24 DIAGNOSIS — N2581 Secondary hyperparathyroidism of renal origin: Secondary | ICD-10-CM | POA: Diagnosis not present

## 2022-10-24 DIAGNOSIS — I129 Hypertensive chronic kidney disease with stage 1 through stage 4 chronic kidney disease, or unspecified chronic kidney disease: Secondary | ICD-10-CM | POA: Diagnosis not present

## 2022-10-24 DIAGNOSIS — G4733 Obstructive sleep apnea (adult) (pediatric): Secondary | ICD-10-CM | POA: Diagnosis not present

## 2023-01-23 DIAGNOSIS — M7989 Other specified soft tissue disorders: Secondary | ICD-10-CM | POA: Diagnosis not present

## 2023-01-23 DIAGNOSIS — G4733 Obstructive sleep apnea (adult) (pediatric): Secondary | ICD-10-CM | POA: Diagnosis not present

## 2023-01-23 DIAGNOSIS — R809 Proteinuria, unspecified: Secondary | ICD-10-CM | POA: Diagnosis not present

## 2023-01-23 DIAGNOSIS — E785 Hyperlipidemia, unspecified: Secondary | ICD-10-CM | POA: Diagnosis not present

## 2023-01-23 DIAGNOSIS — N179 Acute kidney failure, unspecified: Secondary | ICD-10-CM | POA: Diagnosis not present

## 2023-01-23 DIAGNOSIS — E1122 Type 2 diabetes mellitus with diabetic chronic kidney disease: Secondary | ICD-10-CM | POA: Diagnosis not present

## 2023-01-23 DIAGNOSIS — N184 Chronic kidney disease, stage 4 (severe): Secondary | ICD-10-CM | POA: Diagnosis not present

## 2023-01-23 DIAGNOSIS — I129 Hypertensive chronic kidney disease with stage 1 through stage 4 chronic kidney disease, or unspecified chronic kidney disease: Secondary | ICD-10-CM | POA: Diagnosis not present

## 2023-01-23 DIAGNOSIS — E875 Hyperkalemia: Secondary | ICD-10-CM | POA: Diagnosis not present

## 2023-02-06 DIAGNOSIS — E1065 Type 1 diabetes mellitus with hyperglycemia: Secondary | ICD-10-CM | POA: Diagnosis not present

## 2023-02-06 DIAGNOSIS — Z9641 Presence of insulin pump (external) (internal): Secondary | ICD-10-CM | POA: Diagnosis not present

## 2023-02-06 DIAGNOSIS — E7800 Pure hypercholesterolemia, unspecified: Secondary | ICD-10-CM | POA: Diagnosis not present

## 2023-02-06 DIAGNOSIS — I10 Essential (primary) hypertension: Secondary | ICD-10-CM | POA: Diagnosis not present

## 2023-02-06 DIAGNOSIS — E1140 Type 2 diabetes mellitus with diabetic neuropathy, unspecified: Secondary | ICD-10-CM | POA: Diagnosis not present

## 2023-02-06 DIAGNOSIS — E559 Vitamin D deficiency, unspecified: Secondary | ICD-10-CM | POA: Diagnosis not present

## 2023-02-11 DIAGNOSIS — E11621 Type 2 diabetes mellitus with foot ulcer: Secondary | ICD-10-CM | POA: Diagnosis not present

## 2023-02-11 DIAGNOSIS — M14671 Charcot's joint, right ankle and foot: Secondary | ICD-10-CM | POA: Diagnosis not present

## 2023-02-13 DIAGNOSIS — I129 Hypertensive chronic kidney disease with stage 1 through stage 4 chronic kidney disease, or unspecified chronic kidney disease: Secondary | ICD-10-CM | POA: Diagnosis not present

## 2023-04-01 DIAGNOSIS — E109 Type 1 diabetes mellitus without complications: Secondary | ICD-10-CM | POA: Diagnosis not present

## 2023-04-26 DIAGNOSIS — N184 Chronic kidney disease, stage 4 (severe): Secondary | ICD-10-CM | POA: Diagnosis not present

## 2023-04-26 DIAGNOSIS — E785 Hyperlipidemia, unspecified: Secondary | ICD-10-CM | POA: Diagnosis not present

## 2023-04-26 DIAGNOSIS — N179 Acute kidney failure, unspecified: Secondary | ICD-10-CM | POA: Diagnosis not present

## 2023-04-26 DIAGNOSIS — E1122 Type 2 diabetes mellitus with diabetic chronic kidney disease: Secondary | ICD-10-CM | POA: Diagnosis not present

## 2023-04-26 DIAGNOSIS — M7989 Other specified soft tissue disorders: Secondary | ICD-10-CM | POA: Diagnosis not present

## 2023-04-26 DIAGNOSIS — E875 Hyperkalemia: Secondary | ICD-10-CM | POA: Diagnosis not present

## 2023-04-26 DIAGNOSIS — I129 Hypertensive chronic kidney disease with stage 1 through stage 4 chronic kidney disease, or unspecified chronic kidney disease: Secondary | ICD-10-CM | POA: Diagnosis not present

## 2023-04-26 DIAGNOSIS — Z23 Encounter for immunization: Secondary | ICD-10-CM | POA: Diagnosis not present

## 2023-04-26 DIAGNOSIS — R809 Proteinuria, unspecified: Secondary | ICD-10-CM | POA: Diagnosis not present

## 2023-05-13 DIAGNOSIS — E13621 Other specified diabetes mellitus with foot ulcer: Secondary | ICD-10-CM | POA: Diagnosis not present

## 2023-05-13 DIAGNOSIS — E11621 Type 2 diabetes mellitus with foot ulcer: Secondary | ICD-10-CM | POA: Diagnosis not present

## 2023-05-13 DIAGNOSIS — M14671 Charcot's joint, right ankle and foot: Secondary | ICD-10-CM | POA: Diagnosis not present

## 2023-05-20 DIAGNOSIS — I129 Hypertensive chronic kidney disease with stage 1 through stage 4 chronic kidney disease, or unspecified chronic kidney disease: Secondary | ICD-10-CM | POA: Diagnosis not present

## 2023-06-07 DIAGNOSIS — E13621 Other specified diabetes mellitus with foot ulcer: Secondary | ICD-10-CM | POA: Diagnosis not present

## 2023-06-07 DIAGNOSIS — E11621 Type 2 diabetes mellitus with foot ulcer: Secondary | ICD-10-CM | POA: Diagnosis not present

## 2023-06-07 DIAGNOSIS — M14671 Charcot's joint, right ankle and foot: Secondary | ICD-10-CM | POA: Diagnosis not present

## 2023-07-01 DIAGNOSIS — E11621 Type 2 diabetes mellitus with foot ulcer: Secondary | ICD-10-CM | POA: Diagnosis not present

## 2023-07-18 DIAGNOSIS — N184 Chronic kidney disease, stage 4 (severe): Secondary | ICD-10-CM | POA: Diagnosis not present

## 2023-07-19 LAB — LAB REPORT - SCANNED: EGFR: 30

## 2023-07-24 DIAGNOSIS — E11621 Type 2 diabetes mellitus with foot ulcer: Secondary | ICD-10-CM | POA: Diagnosis not present

## 2023-07-24 DIAGNOSIS — M14671 Charcot's joint, right ankle and foot: Secondary | ICD-10-CM | POA: Diagnosis not present

## 2023-07-26 DIAGNOSIS — Z23 Encounter for immunization: Secondary | ICD-10-CM | POA: Diagnosis not present

## 2023-07-26 DIAGNOSIS — E785 Hyperlipidemia, unspecified: Secondary | ICD-10-CM | POA: Diagnosis not present

## 2023-07-26 DIAGNOSIS — E875 Hyperkalemia: Secondary | ICD-10-CM | POA: Diagnosis not present

## 2023-07-26 DIAGNOSIS — N179 Acute kidney failure, unspecified: Secondary | ICD-10-CM | POA: Diagnosis not present

## 2023-07-26 DIAGNOSIS — E1122 Type 2 diabetes mellitus with diabetic chronic kidney disease: Secondary | ICD-10-CM | POA: Diagnosis not present

## 2023-07-26 DIAGNOSIS — N184 Chronic kidney disease, stage 4 (severe): Secondary | ICD-10-CM | POA: Diagnosis not present

## 2023-07-26 DIAGNOSIS — Z6831 Body mass index (BMI) 31.0-31.9, adult: Secondary | ICD-10-CM | POA: Diagnosis not present

## 2023-07-26 DIAGNOSIS — R809 Proteinuria, unspecified: Secondary | ICD-10-CM | POA: Diagnosis not present

## 2023-07-26 DIAGNOSIS — I129 Hypertensive chronic kidney disease with stage 1 through stage 4 chronic kidney disease, or unspecified chronic kidney disease: Secondary | ICD-10-CM | POA: Diagnosis not present

## 2023-08-06 DIAGNOSIS — E7800 Pure hypercholesterolemia, unspecified: Secondary | ICD-10-CM | POA: Diagnosis not present

## 2023-08-06 DIAGNOSIS — E1065 Type 1 diabetes mellitus with hyperglycemia: Secondary | ICD-10-CM | POA: Diagnosis not present

## 2023-08-13 DIAGNOSIS — E1065 Type 1 diabetes mellitus with hyperglycemia: Secondary | ICD-10-CM | POA: Diagnosis not present

## 2023-08-13 DIAGNOSIS — I10 Essential (primary) hypertension: Secondary | ICD-10-CM | POA: Diagnosis not present

## 2023-08-13 DIAGNOSIS — E1140 Type 2 diabetes mellitus with diabetic neuropathy, unspecified: Secondary | ICD-10-CM | POA: Diagnosis not present

## 2023-08-13 DIAGNOSIS — E559 Vitamin D deficiency, unspecified: Secondary | ICD-10-CM | POA: Diagnosis not present

## 2023-08-13 DIAGNOSIS — E7800 Pure hypercholesterolemia, unspecified: Secondary | ICD-10-CM | POA: Diagnosis not present

## 2023-08-13 DIAGNOSIS — Z9641 Presence of insulin pump (external) (internal): Secondary | ICD-10-CM | POA: Diagnosis not present

## 2023-08-14 DIAGNOSIS — M14671 Charcot's joint, right ankle and foot: Secondary | ICD-10-CM | POA: Diagnosis not present

## 2023-08-19 DIAGNOSIS — Z794 Long term (current) use of insulin: Secondary | ICD-10-CM | POA: Diagnosis not present

## 2023-08-19 DIAGNOSIS — E1065 Type 1 diabetes mellitus with hyperglycemia: Secondary | ICD-10-CM | POA: Diagnosis not present

## 2023-08-19 DIAGNOSIS — E109 Type 1 diabetes mellitus without complications: Secondary | ICD-10-CM | POA: Diagnosis not present

## 2023-09-10 ENCOUNTER — Telehealth: Payer: Self-pay | Admitting: Family

## 2023-09-10 ENCOUNTER — Encounter: Payer: Self-pay | Admitting: Family

## 2023-09-10 ENCOUNTER — Ambulatory Visit: Admitting: Family

## 2023-09-10 VITALS — BP 165/69 | HR 65 | Resp 16 | Ht 66.0 in | Wt 207.0 lb

## 2023-09-10 DIAGNOSIS — D631 Anemia in chronic kidney disease: Secondary | ICD-10-CM

## 2023-09-10 DIAGNOSIS — E11621 Type 2 diabetes mellitus with foot ulcer: Secondary | ICD-10-CM

## 2023-09-10 DIAGNOSIS — Z125 Encounter for screening for malignant neoplasm of prostate: Secondary | ICD-10-CM | POA: Diagnosis not present

## 2023-09-10 DIAGNOSIS — E1022 Type 1 diabetes mellitus with diabetic chronic kidney disease: Secondary | ICD-10-CM | POA: Diagnosis not present

## 2023-09-10 DIAGNOSIS — E1042 Type 1 diabetes mellitus with diabetic polyneuropathy: Secondary | ICD-10-CM

## 2023-09-10 DIAGNOSIS — F418 Other specified anxiety disorders: Secondary | ICD-10-CM | POA: Insufficient documentation

## 2023-09-10 DIAGNOSIS — Z1159 Encounter for screening for other viral diseases: Secondary | ICD-10-CM | POA: Diagnosis not present

## 2023-09-10 DIAGNOSIS — D509 Iron deficiency anemia, unspecified: Secondary | ICD-10-CM

## 2023-09-10 DIAGNOSIS — K219 Gastro-esophageal reflux disease without esophagitis: Secondary | ICD-10-CM | POA: Diagnosis not present

## 2023-09-10 DIAGNOSIS — E10319 Type 1 diabetes mellitus with unspecified diabetic retinopathy without macular edema: Secondary | ICD-10-CM | POA: Insufficient documentation

## 2023-09-10 DIAGNOSIS — N401 Enlarged prostate with lower urinary tract symptoms: Secondary | ICD-10-CM

## 2023-09-10 DIAGNOSIS — E785 Hyperlipidemia, unspecified: Secondary | ICD-10-CM

## 2023-09-10 DIAGNOSIS — R195 Other fecal abnormalities: Secondary | ICD-10-CM

## 2023-09-10 DIAGNOSIS — R3912 Poor urinary stream: Secondary | ICD-10-CM

## 2023-09-10 DIAGNOSIS — I10 Essential (primary) hypertension: Secondary | ICD-10-CM

## 2023-09-10 DIAGNOSIS — I129 Hypertensive chronic kidney disease with stage 1 through stage 4 chronic kidney disease, or unspecified chronic kidney disease: Secondary | ICD-10-CM

## 2023-09-10 DIAGNOSIS — N1832 Chronic kidney disease, stage 3b: Secondary | ICD-10-CM

## 2023-09-10 DIAGNOSIS — E559 Vitamin D deficiency, unspecified: Secondary | ICD-10-CM

## 2023-09-10 DIAGNOSIS — N184 Chronic kidney disease, stage 4 (severe): Secondary | ICD-10-CM

## 2023-09-10 LAB — COMPREHENSIVE METABOLIC PANEL WITH GFR
ALT: 29 U/L (ref 0–53)
AST: 22 U/L (ref 0–37)
Albumin: 4 g/dL (ref 3.5–5.2)
Alkaline Phosphatase: 82 U/L (ref 39–117)
BUN: 42 mg/dL — ABNORMAL HIGH (ref 6–23)
CO2: 23 meq/L (ref 19–32)
Calcium: 9.2 mg/dL (ref 8.4–10.5)
Chloride: 104 meq/L (ref 96–112)
Creatinine, Ser: 3.38 mg/dL — ABNORMAL HIGH (ref 0.40–1.50)
GFR: 20.58 mL/min — ABNORMAL LOW (ref >60.00)
Glucose, Bld: 174 mg/dL — ABNORMAL HIGH (ref 70–99)
Potassium: 4.8 meq/L (ref 3.5–5.1)
Sodium: 134 meq/L — ABNORMAL LOW (ref 135–145)
Total Bilirubin: 0.7 mg/dL (ref 0.2–1.2)
Total Protein: 7.1 g/dL (ref 6.0–8.3)

## 2023-09-10 LAB — LIPID PANEL
Cholesterol: 130 mg/dL (ref 0–200)
HDL: 37.8 mg/dL — ABNORMAL LOW (ref >39.00)
LDL Cholesterol: 71 mg/dL (ref 0–99)
NonHDL: 92.21
Total CHOL/HDL Ratio: 3
Triglycerides: 105 mg/dL (ref 0.0–149.0)
VLDL: 21 mg/dL (ref 0.0–40.0)

## 2023-09-10 LAB — CBC WITH DIFFERENTIAL/PLATELET
Basophils Absolute: 0.1 10*3/uL (ref 0.0–0.1)
Basophils Relative: 0.7 % (ref 0.0–3.0)
Eosinophils Absolute: 0.3 10*3/uL (ref 0.0–0.7)
Eosinophils Relative: 3.7 % (ref 0.0–5.0)
HCT: 37.6 % — ABNORMAL LOW (ref 39.0–52.0)
Hemoglobin: 12.3 g/dL — ABNORMAL LOW (ref 13.0–17.0)
Lymphocytes Relative: 20 % (ref 12.0–46.0)
Lymphs Abs: 1.5 10*3/uL (ref 0.7–4.0)
MCHC: 32.7 g/dL (ref 30.0–36.0)
MCV: 81.8 fl (ref 78.0–100.0)
Monocytes Absolute: 0.7 10*3/uL (ref 0.1–1.0)
Monocytes Relative: 9 % (ref 3.0–12.0)
Neutro Abs: 5.1 10*3/uL (ref 1.4–7.7)
Neutrophils Relative %: 66.6 % (ref 43.0–77.0)
Platelets: 251 10*3/uL (ref 150.0–400.0)
RBC: 4.6 Mil/uL (ref 4.22–5.81)
RDW: 13.2 % (ref 11.5–15.5)
WBC: 7.6 10*3/uL (ref 4.0–10.5)

## 2023-09-10 LAB — MICROALBUMIN / CREATININE URINE RATIO
Creatinine,U: 68.2 mg/dL
Microalb Creat Ratio: 2332.4 mg/g — ABNORMAL HIGH (ref 0.0–30.0)
Microalb, Ur: 159.1 mg/dL — ABNORMAL HIGH (ref 0.0–1.9)

## 2023-09-10 LAB — IBC PANEL
Iron: 123 ug/dL (ref 42–165)
Saturation Ratios: 46.7 % (ref 20.0–50.0)
TIBC: 263.2 ug/dL (ref 250.0–450.0)
Transferrin: 188 mg/dL — ABNORMAL LOW (ref 212.0–360.0)

## 2023-09-10 LAB — HEMOGLOBIN A1C: Hgb A1c MFr Bld: 6.4 % (ref 4.6–6.5)

## 2023-09-10 LAB — VITAMIN D 25 HYDROXY (VIT D DEFICIENCY, FRACTURES): VITD: 30.3 ng/mL (ref 30.00–100.00)

## 2023-09-10 LAB — PSA: PSA: 1.6 ng/mL (ref 0.10–4.00)

## 2023-09-10 MED ORDER — AMLODIPINE BESYLATE 5 MG PO TABS: 5.0000 mg | ORAL_TABLET | Freq: Every day | ORAL | 3 refills | Status: DC

## 2023-09-10 MED ORDER — ATORVASTATIN CALCIUM 40 MG PO TABS: 40.0000 mg | ORAL_TABLET | Freq: Every day | ORAL | 3 refills | Status: DC

## 2023-09-10 MED ORDER — TAMSULOSIN HCL 0.4 MG PO CAPS
0.4000 mg | ORAL_CAPSULE | Freq: Every day | ORAL | 1 refills | Status: AC
Start: 2023-09-10 — End: ?

## 2023-09-10 MED ORDER — INSULIN PUMP
1.0000 | Freq: Every day | SUBCUTANEOUS | Status: AC
Start: 2023-09-10 — End: ?

## 2023-09-10 NOTE — Assessment & Plan Note (Signed)
 On zetia, previously on atorvastatin- not sure why it was discontinued- does not remember side effects or allergy. Will restart to decreased CV risk.

## 2023-09-10 NOTE — Telephone Encounter (Signed)
 Colonoscopy and eye exam requested electronically.

## 2023-09-10 NOTE — Assessment & Plan Note (Addendum)
 Tolerating iron 325mg  one tablet by mouth once daily. Update iron level and cbc.

## 2023-09-10 NOTE — Assessment & Plan Note (Signed)
 New complaint, check PSA, trial of flomax.

## 2023-09-10 NOTE — Assessment & Plan Note (Signed)
 Following with nephrology- they are discussing possibility of referral for renal/pancreas transplant.

## 2023-09-10 NOTE — Assessment & Plan Note (Deleted)
 Followed by [facility].  He is in a boot, they keep shaving down a callus on his right foot.

## 2023-09-10 NOTE — Assessment & Plan Note (Signed)
 Update vit D level. Not currently on vitamin D supplement.

## 2023-09-10 NOTE — Telephone Encounter (Signed)
 Please request a copy of colonoscopy from Dr. Jeani Hawking.  Also request [facility] Dr. [place] off [address] and Dr. Ashley Royalty

## 2023-09-10 NOTE — Assessment & Plan Note (Signed)
 Rarely bothers him.  Not on gabapentin, declines rx at this time.

## 2023-09-10 NOTE — Assessment & Plan Note (Signed)
 Followed by [facility].  He is in a Engineer, building services. Pt reports that the ulcer is healed but they keep shaving down a callus on his right foot.

## 2023-09-10 NOTE — Assessment & Plan Note (Signed)
Update CBC today. 

## 2023-09-10 NOTE — Progress Notes (Addendum)
 Subjective:     Patient ID: Javier Gentile., male    DOB: [birth year], 48 y.o.   MRN: [id number]  Chief Complaint  Patient presents with   New Patient (Initial Visit)    HPI  Discussed the use of AI scribe software for clinical note transcription with the patient, who gave verbal consent to proceed.  History of Present Illness  Patient is a 48 yr old male with a complicated medical history who presents today to establish care. He has multiple specialists but has not been seen by primary care in 2-3 years. Previously followed by [facility].   DM1- diagnosed age 21. Followed by endo, on insulin pump with a Base rate 36 units/day. He has diabetic retinopathy followed by Dr. Ashley Royalty and peripheral neuropathy which does not really cause him much pain.   CKD stage 4-  following with nephrology- Dr. Abel Presto. Discussing possible referral for renal transplant and pancreatic transplant.  He reports most recent GFR 15-25%.  He is not on HD.   He follows with [facility] for a thickened callus on the bottom of the right foot.  They have been shaving down the callus.   Hyperlipidemia- he is on zetia, used to be on atorvastatin but notes that this was stopped for unknown reasons.   Notes poor urinary stream. Denies dysuria or frequency.  Reports possible hemorrhoid with BRBPR with wiping- only last 2-3 months.    Health Maintenance Due  Topic Date Due   Medicare Annual Wellness (AWV)  Never done   Pneumococcal Vaccine 51-41 Years old (1 of 2 - PCV) Never done   OPHTHALMOLOGY EXAM  Never done   Hepatitis C Screening  Never done   Colonoscopy  Never done   FOOT EXAM  10/24/2019   HEMOGLOBIN A1C  02/12/2020   Diabetic kidney evaluation - Urine ACR  08/10/2020   Diabetic kidney evaluation - eGFR measurement  10/16/2021   COVID-19 Vaccine (1 - 2024-25 season) Never done    Past Medical History:  Diagnosis Date   Acute osteomyelitis of right calcaneus (HCC)    s/p  debiidement and wound vac 08-28-2019   Anemia of chronic renal failure, stage 3a (HCC) 09/10/2019   sees dr Keturah Shavers 09-23-2019 on chart   Charcot's joint of right ankle    Chronic heel ulcer, right, limited to breakdown of skin (HCC)    since march 2021, has wound vac since 10-27-2019 changed vac 10-12-2019   DM type 1 (diabetes mellitus, type 1) (HCC)    Erythropoietin deficiency anemia 09/10/2019   Hyperkalemia    Hypertension    Iron deficiency anemia due to chronic blood loss 09/10/2019   Kidney disease    Neuropathy    feet and toes   Sleep apnea    cpap     Past Surgical History:  Procedure Laterality Date   ANKLE SURGERY Right    APPENDECTOMY  yrs ago   GRAFT APPLICATION Right 10/28/2019   Procedure: GRAFT APPLICATION;  Surgeon: Park Liter, DPM;  Location: [facility] [place];  Service: Podiatry;  Laterality: Right;   I & D EXTREMITY Right 10/16/2019   Procedure: IRRIGATION AND DEBRIDEMENT OF RIGHT HEEL WITH APPLICATION WOUND VAC;  Surgeon: Park Liter, DPM;  Location: [facility] [place];  Service: Podiatry;  Laterality: Right;   INCISION AND DRAINAGE OF WOUND Right 10/28/2019   Procedure: IRRIGATION AND DEBRIDEMENT WOUND RIGHT HEEL;  Surgeon: Park Liter, DPM;  Location: [facility];  Service: Podiatry;  Laterality: Right;   IRRIGATION AND DEBRIDEMENT FOOT Right 08/28/2019   Procedure: RIght foot wound incision and drainage, debridement and irrigation, bone biopsy, application of wound VAC;  Surgeon: Park Liter, DPM;  Location: [facility];  Service: Podiatry;  Laterality: Right;    Family History  Problem Relation Age of Onset   Heart failure Mother    Stroke Mother    Kidney failure Mother    Cancer Father 24       non hodgkin's lymphoma   Heart failure Father    Cancer Brother 52       unsure what type    Social History   Socioeconomic History   Marital status: Married    Spouse name: Secondary school teacher   Number of  children: 1   Years of education: 12   Highest education level: Some college, no degree  Occupational History   Not on file  Tobacco Use   Smoking status: Never   Smokeless tobacco: Never  Vaping Use   Vaping status: Never Used  Substance and Sexual Activity   Alcohol use: No   Drug use: Never   Sexual activity: Not Currently  Other Topics Concern   Not on file  Social History Narrative   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   Married   One son 2009   Completed some college   No pets   Enjoys watching his son's sports   Social Drivers of Corporate investment banker Strain: Low Risk  (09/07/2023)   Overall Financial Resource Strain (CARDIA)    Difficulty of Paying Living Expenses: Not very hard  Food Insecurity: No Food Insecurity (09/07/2023)   Hunger Vital Sign    Worried About Running Out of Food in the Last Year: Never true    Ran Out of Food in the Last Year: Never true  Transportation Needs: No Transportation Needs (09/07/2023)   PRAPARE - Administrator, Civil Service (Medical): No    Lack of Transportation (Non-Medical): No  Physical Activity: Unknown (09/07/2023)   Exercise Vital Sign    Days of Exercise per Week: 0 days    Minutes of Exercise per Session: Not on file  Stress: Stress Concern Present (09/07/2023)   [employer] of Occupational Health - Occupational Stress Questionnaire    Feeling of Stress : To some extent  Social Connections: Moderately Integrated (09/07/2023)   Social Connection and Isolation Panel [NHANES]    Frequency of Communication with Friends and Family: Three times a week    Frequency of Social Gatherings with Friends and Family: Patient declined    Attends Religious Services: More than 4 times per year    Active Member of [employer] or Organizations: No    Attends Engineer, structural: Not on file    Marital Status: Married  Intimate Partner Violence: Unknown (09/08/2021)   Received from [employer]   HITS    Physically Hurt: Not on file    Insult or Talk Down To: Not on file    Threaten Physical Harm: Not on file    Scream or Curse: Not on file    Outpatient Medications Prior to Visit  Medication Sig Dispense Refill   aspirin 81 MG tablet Take 81 mg by mouth daily.     ezetimibe (ZETIA) 10 MG tablet Take 10 mg by mouth daily.     ferrous sulfate 325 (65 FE) MG tablet Take  1 tablet (325 mg total) by mouth 2 (two) times daily with a meal.  3   hydrALAZINE (APRESOLINE) 50 MG tablet Take 1 tablet (50 mg total) by mouth 3 (three) times daily. 270 tablet 3   metoprolol succinate (TOPROL-XL) 100 MG 24 hr tablet Take 100 mg by mouth daily.     omeprazole (PRILOSEC) 40 MG capsule Take 40 mg by mouth daily.  3   spironolactone (ALDACTONE) 25 MG tablet Take 25 mg by mouth daily.     Insulin Human (INSULIN PUMP) SOLN Inject into the skin. Humalog. Base: 25.45 units per day.  1 unit per 10 grams of carbohydrates.  Basal rate varies usually 0.95     acetaminophen (TYLENOL) 500 MG tablet Take 500 mg by mouth every 6 (six) hours as needed for moderate pain.     atorvastatin (LIPITOR) 40 MG tablet Take 40 mg by mouth daily at 6 PM.      BINAXNOW COVID-19 AG HOME TEST KIT USE AS DIRECTED IN PACKAGE     CONTOUR NEXT TEST test strip USE 8 TIMES DAILY AND AS DIRECTED  12   insulin lispro (HUMALOG) 100 UNIT/ML injection 100U/DAY VIA PUMP SUBCUTANEOUSLY     silver sulfADIAZINE (SILVADENE) 1 % cream Apply pea-sized amount to wound daily. 50 g 0   Vitamin D, Ergocalciferol, (DRISDOL) 50000 UNITS CAPS capsule Take 50,000 Units by mouth 2 (two) times a week. Monday and Wednesday     No facility-administered medications prior to visit.    Allergies  Allergen Reactions   Lisinopril Cough   Keflex [Cephalexin] Rash    Review of Systems  Constitutional:  Negative for weight loss.  HENT:  Negative for congestion and hearing loss.   Eyes:  Negative for blurred vision.   Respiratory:  Negative for cough.   Cardiovascular:  Positive for leg swelling.  Gastrointestinal:  Negative for constipation.  Genitourinary:  Negative for dysuria, frequency and urgency.       Decreased flow  Musculoskeletal:  Positive for back pain. Negative for joint pain and myalgias.  Skin:  Negative for rash.  Neurological:  Negative for headaches.   BP Readings from Last 3 Encounters:  09/10/23 (!) 165/69  11/15/20 (!) 158/86  10/16/20 (!) 161/76       Objective:    Physical Exam Constitutional:      General: He is not in acute distress.    Appearance: He is well-developed.  HENT:     Head: Normocephalic and atraumatic.  Cardiovascular:     Rate and Rhythm: Normal rate and regular rhythm.     Heart sounds: No murmur heard. Pulmonary:     Effort: Pulmonary effort is normal. No respiratory distress.     Breath sounds: Normal breath sounds. No wheezing or rales.  Genitourinary:    Rectum: Guaiac result positive. External hemorrhoid present.     Comments: Unable to palpate prostate due to discomfort on exam with hemorrhoids  Skin:    General: Skin is warm and dry.  Neurological:     Mental Status: He is alert and oriented to person, place, and time.  Psychiatric:        Behavior: Behavior normal.        Thought Content: Thought content normal.      BP (!) 165/69   Pulse 65   Resp 16   Ht 5\' 6"  (1.676 m)   Wt 207 lb (93.9 kg)   SpO2 99%   BMI 33.41 kg/m  Wt Readings from  Last 3 Encounters:  09/10/23 207 lb (93.9 kg)  12/15/20 203 lb (92.1 kg)  11/15/20 203 lb (92.1 kg)       Assessment & Plan:   Problem List Items Addressed This Visit       Unprioritized   CKD (chronic kidney disease) stage 4, GFR 15-29 ml/min (HCC) (Chronic)   Following with nephrology- they are discussing possibility of referral for renal/pancreas transplant.       Vitamin D deficiency   Update vit D level. Not currently on vitamin D supplement.       Relevant Orders    VITAMIN D 25 Hydroxy (Vit-D Deficiency, Fractures)   Type 1 diabetes mellitus with stage 4 chronic kidney disease and hypertension (HCC) - Primary   Relevant Medications   spironolactone (ALDACTONE) 25 MG tablet   atorvastatin (LIPITOR) 40 MG tablet   amLODipine (NORVASC) 5 MG tablet   Insulin Human (INSULIN PUMP) SOLN   Type 1 diabetes mellitus with retinopathy (HCC)   Follows with Dr. Ashley Royalty for retinopathy.  Also follows with Dr. Talmage Nap for DM1- on an insulin pump.       Relevant Medications   atorvastatin (LIPITOR) 40 MG tablet   Insulin Human (INSULIN PUMP) SOLN   Primary hypertension   Uncontrolled. Had cough on lisinopril. Considered ARB but pt is on spironolactone. Will rx with amlodipine 5mg .       Relevant Medications   spironolactone (ALDACTONE) 25 MG tablet   atorvastatin (LIPITOR) 40 MG tablet   amLODipine (NORVASC) 5 MG tablet   Iron deficiency anemia   Tolerating iron 325mg  one tablet by mouth once daily. Update iron level and cbc.      Relevant Orders   IBC panel   CBC w/Diff   Hyperlipidemia   On zetia, previously on atorvastatin- not sure why it was discontinued- does not remember side effects or allergy. Will restart to decreased CV risk.      Relevant Medications   spironolactone (ALDACTONE) 25 MG tablet   atorvastatin (LIPITOR) 40 MG tablet   amLODipine (NORVASC) 5 MG tablet   Other Relevant Orders   Lipid panel   Gastroesophageal reflux disease   Stable on omeprazole.       Diabetic neuropathy (HCC)   Rarely bothers him.  Not on gabapentin, declines rx at this time.       Relevant Medications   atorvastatin (LIPITOR) 40 MG tablet   Insulin Human (INSULIN PUMP) SOLN   Diabetic foot ulcer (HCC)   Followed by [facility].  He is in a Engineer, building services. Pt reports that the ulcer is healed but they keep shaving down a callus on his right foot.       Relevant Medications   atorvastatin (LIPITOR) 40 MG tablet   Insulin Human (INSULIN PUMP) SOLN    Depression with anxiety   Not on medication- he attributes his mood issues to "being home all the time." We discussed possibility of starting counseling and pt was given the number to call to schedule.       Benign prostatic hyperplasia with weak urinary stream   New complaint, check PSA, trial of flomax.       Relevant Medications   tamsulosin (FLOMAX) 0.4 MG CAPS capsule   Anemia of chronic renal failure, stage 4 (severe) (HCC)   Update CBC today.      Other Visit Diagnoses       Encounter for hepatitis C screening test for low risk patient  Relevant Orders   Hepatitis C Antibody     Screening for prostate cancer       Relevant Orders   PSA     Heme positive stool       Relevant Orders   Ambulatory referral to Gastroenterology      >60 minutes spent on today's visit. Time was spent counseling patient on his medical management, reviewing records in care everywhere and formulating complex medical plan.   I have discontinued Donnetta Simpers Jr.'s Vitamin D (Ergocalciferol), atorvastatin, Contour Next Test, acetaminophen, BinaxNOW COVID-19 Ag Home Test, insulin lispro, and silver sulfADIAZINE. I have also changed his insulin pump. Additionally, I am having him start on atorvastatin, tamsulosin, and amLODipine. Lastly, I am having him maintain his aspirin, omeprazole, ezetimibe, ferrous sulfate, metoprolol succinate, hydrALAZINE, and spironolactone.  Meds ordered this encounter  Medications   atorvastatin (LIPITOR) 40 MG tablet    Sig: Take 1 tablet (40 mg total) by mouth daily.    Dispense:  90 tablet    Refill:  3    Supervising Provider:   Danise Edge A [[id number]]   tamsulosin (FLOMAX) 0.4 MG CAPS capsule    Sig: Take 1 capsule (0.4 mg total) by mouth daily.    Dispense:  90 capsule    Refill:  1    Supervising Provider:   Danise Edge A [[id number]]   amLODipine (NORVASC) 5 MG tablet    Sig: Take 1 tablet (5 mg total) by mouth daily.    Dispense:  30 tablet    Refill:   3    Supervising Provider:   Danise Edge A [[id number]]   Insulin Human (INSULIN PUMP) SOLN    Sig: Inject 1 each into the skin daily. Humalog. Base: 36 units per day.  1 unit per 10 grams of carbohydrates.  Basal rate varies usually 0.95    Supervising Provider:   Danise Edge A [[id number]]

## 2023-09-10 NOTE — Telephone Encounter (Signed)
 Please request most recent OV and labs from [facility]- Dr. Abel Presto.

## 2023-09-10 NOTE — Assessment & Plan Note (Signed)
 Not on medication- he attributes his mood issues to "being home all the time." We discussed possibility of starting counseling and pt was given the number to call to schedule.

## 2023-09-10 NOTE — Assessment & Plan Note (Signed)
 Follows with Dr. Ashley Royalty for retinopathy.  Also follows with Dr. Talmage Nap for DM1- on an insulin pump.

## 2023-09-10 NOTE — Assessment & Plan Note (Signed)
 Stable on omeprazole.

## 2023-09-10 NOTE — Assessment & Plan Note (Signed)
 Uncontrolled. Had cough on lisinopril. Considered ARB but pt is on spironolactone. Will rx with amlodipine 5mg .

## 2023-09-10 NOTE — Patient Instructions (Addendum)
 Please restart atorvastatin. Start flomax to help you with urination. Add amlodipine 5mg  once daily for blood pressure.

## 2023-09-11 ENCOUNTER — Encounter: Payer: Self-pay | Admitting: Family

## 2023-09-11 LAB — HEPATITIS C ANTIBODY: Hepatitis C Ab: NONREACTIVE

## 2023-09-11 NOTE — Telephone Encounter (Signed)
 Electronic request made

## 2023-09-12 ENCOUNTER — Telehealth: Payer: Self-pay | Admitting: Family

## 2023-09-12 NOTE — Telephone Encounter (Signed)
 Opened in error

## 2023-09-12 NOTE — Telephone Encounter (Signed)
 Can you please fax a copy of the labs drawn at his visit to his nephrologist- Dr. Abel Presto?

## 2023-09-13 ENCOUNTER — Telehealth: Payer: Self-pay | Admitting: Family

## 2023-09-13 DIAGNOSIS — E13621 Other specified diabetes mellitus with foot ulcer: Secondary | ICD-10-CM | POA: Diagnosis not present

## 2023-09-13 DIAGNOSIS — E11621 Type 2 diabetes mellitus with foot ulcer: Secondary | ICD-10-CM | POA: Diagnosis not present

## 2023-09-13 DIAGNOSIS — Z794 Long term (current) use of insulin: Secondary | ICD-10-CM | POA: Diagnosis not present

## 2023-09-13 DIAGNOSIS — M14671 Charcot's joint, right ankle and foot: Secondary | ICD-10-CM | POA: Diagnosis not present

## 2023-09-13 NOTE — Telephone Encounter (Signed)
 Labs sent to [facility]

## 2023-09-13 NOTE — Telephone Encounter (Signed)
-----   Message from Irena Cords sent at 09/12/2023  2:55 PM EDT ----- Ok thanks ----- Message ----- From: Sandford Craze, NP Sent: 09/12/2023   2:46 PM EDT To: Terrial Rhodes, MD  Hello,  FYI, He came to establish care with me this week.  His GFR was 20 the day he came to see me.  It looks like the last GFR from your office was 30- just wanted you to know. Thank you.   Javier Hunt

## 2023-09-16 DIAGNOSIS — R195 Other fecal abnormalities: Secondary | ICD-10-CM | POA: Diagnosis not present

## 2023-09-16 DIAGNOSIS — K6289 Other specified diseases of anus and rectum: Secondary | ICD-10-CM | POA: Diagnosis not present

## 2023-09-16 DIAGNOSIS — Z860100 Personal history of colon polyps, unspecified: Secondary | ICD-10-CM | POA: Diagnosis not present

## 2023-09-16 DIAGNOSIS — K602 Anal fissure, unspecified: Secondary | ICD-10-CM | POA: Diagnosis not present

## 2023-09-18 DIAGNOSIS — E1065 Type 1 diabetes mellitus with hyperglycemia: Secondary | ICD-10-CM | POA: Diagnosis not present

## 2023-09-18 DIAGNOSIS — Z794 Long term (current) use of insulin: Secondary | ICD-10-CM | POA: Diagnosis not present

## 2023-09-18 DIAGNOSIS — E109 Type 1 diabetes mellitus without complications: Secondary | ICD-10-CM | POA: Diagnosis not present

## 2023-09-23 ENCOUNTER — Encounter: Payer: Self-pay | Admitting: *Deleted

## 2023-09-27 ENCOUNTER — Encounter (INDEPENDENT_AMBULATORY_CARE_PROVIDER_SITE_OTHER): Payer: Medicare HMO | Admitting: Ophthalmology

## 2023-09-27 DIAGNOSIS — H2513 Age-related nuclear cataract, bilateral: Secondary | ICD-10-CM | POA: Diagnosis not present

## 2023-09-27 DIAGNOSIS — Z794 Long term (current) use of insulin: Secondary | ICD-10-CM | POA: Diagnosis not present

## 2023-09-27 DIAGNOSIS — I10 Essential (primary) hypertension: Secondary | ICD-10-CM | POA: Diagnosis not present

## 2023-09-27 DIAGNOSIS — H35033 Hypertensive retinopathy, bilateral: Secondary | ICD-10-CM | POA: Diagnosis not present

## 2023-09-27 DIAGNOSIS — E103512 Type 1 diabetes mellitus with proliferative diabetic retinopathy with macular edema, left eye: Secondary | ICD-10-CM

## 2023-09-27 DIAGNOSIS — H43813 Vitreous degeneration, bilateral: Secondary | ICD-10-CM | POA: Diagnosis not present

## 2023-09-27 DIAGNOSIS — H35371 Puckering of macula, right eye: Secondary | ICD-10-CM | POA: Diagnosis not present

## 2023-09-27 DIAGNOSIS — E103591 Type 1 diabetes mellitus with proliferative diabetic retinopathy without macular edema, right eye: Secondary | ICD-10-CM

## 2023-10-08 DIAGNOSIS — Z1211 Encounter for screening for malignant neoplasm of colon: Secondary | ICD-10-CM | POA: Diagnosis not present

## 2023-10-08 DIAGNOSIS — D123 Benign neoplasm of transverse colon: Secondary | ICD-10-CM | POA: Diagnosis not present

## 2023-10-08 DIAGNOSIS — Z860100 Personal history of colon polyps, unspecified: Secondary | ICD-10-CM | POA: Diagnosis not present

## 2023-10-08 DIAGNOSIS — K635 Polyp of colon: Secondary | ICD-10-CM | POA: Diagnosis not present

## 2023-10-08 DIAGNOSIS — Q438 Other specified congenital malformations of intestine: Secondary | ICD-10-CM | POA: Diagnosis not present

## 2023-10-08 DIAGNOSIS — Z860101 Personal history of adenomatous and serrated colon polyps: Secondary | ICD-10-CM | POA: Diagnosis not present

## 2023-10-08 DIAGNOSIS — K6389 Other specified diseases of intestine: Secondary | ICD-10-CM | POA: Diagnosis not present

## 2023-10-08 LAB — HM COLONOSCOPY

## 2023-10-09 ENCOUNTER — Encounter (INDEPENDENT_AMBULATORY_CARE_PROVIDER_SITE_OTHER): Admitting: Ophthalmology

## 2023-10-09 DIAGNOSIS — Z794 Long term (current) use of insulin: Secondary | ICD-10-CM

## 2023-10-09 DIAGNOSIS — E103512 Type 1 diabetes mellitus with proliferative diabetic retinopathy with macular edema, left eye: Secondary | ICD-10-CM

## 2023-10-11 ENCOUNTER — Ambulatory Visit: Admitting: Family

## 2023-10-11 ENCOUNTER — Ambulatory Visit (INDEPENDENT_AMBULATORY_CARE_PROVIDER_SITE_OTHER): Admitting: Family

## 2023-10-11 ENCOUNTER — Encounter: Payer: Self-pay | Admitting: Family

## 2023-10-11 VITALS — BP 134/70 | HR 66 | Temp 97.8°F | Ht 66.0 in | Wt 212.6 lb

## 2023-10-11 DIAGNOSIS — L97509 Non-pressure chronic ulcer of other part of unspecified foot with unspecified severity: Secondary | ICD-10-CM

## 2023-10-11 DIAGNOSIS — E875 Hyperkalemia: Secondary | ICD-10-CM | POA: Diagnosis not present

## 2023-10-11 DIAGNOSIS — N401 Enlarged prostate with lower urinary tract symptoms: Secondary | ICD-10-CM | POA: Diagnosis not present

## 2023-10-11 DIAGNOSIS — E7800 Pure hypercholesterolemia, unspecified: Secondary | ICD-10-CM

## 2023-10-11 DIAGNOSIS — N184 Chronic kidney disease, stage 4 (severe): Secondary | ICD-10-CM | POA: Diagnosis not present

## 2023-10-11 DIAGNOSIS — E10319 Type 1 diabetes mellitus with unspecified diabetic retinopathy without macular edema: Secondary | ICD-10-CM

## 2023-10-11 DIAGNOSIS — R3912 Poor urinary stream: Secondary | ICD-10-CM | POA: Diagnosis not present

## 2023-10-11 DIAGNOSIS — G4733 Obstructive sleep apnea (adult) (pediatric): Secondary | ICD-10-CM | POA: Diagnosis not present

## 2023-10-11 DIAGNOSIS — N179 Acute kidney failure, unspecified: Secondary | ICD-10-CM | POA: Diagnosis not present

## 2023-10-11 DIAGNOSIS — E559 Vitamin D deficiency, unspecified: Secondary | ICD-10-CM

## 2023-10-11 DIAGNOSIS — E10621 Type 1 diabetes mellitus with foot ulcer: Secondary | ICD-10-CM

## 2023-10-11 DIAGNOSIS — Z6831 Body mass index (BMI) 31.0-31.9, adult: Secondary | ICD-10-CM | POA: Diagnosis not present

## 2023-10-11 DIAGNOSIS — I10 Essential (primary) hypertension: Secondary | ICD-10-CM | POA: Diagnosis not present

## 2023-10-11 DIAGNOSIS — E785 Hyperlipidemia, unspecified: Secondary | ICD-10-CM | POA: Diagnosis not present

## 2023-10-11 DIAGNOSIS — M7989 Other specified soft tissue disorders: Secondary | ICD-10-CM | POA: Diagnosis not present

## 2023-10-11 DIAGNOSIS — E1122 Type 2 diabetes mellitus with diabetic chronic kidney disease: Secondary | ICD-10-CM | POA: Diagnosis not present

## 2023-10-11 DIAGNOSIS — I129 Hypertensive chronic kidney disease with stage 1 through stage 4 chronic kidney disease, or unspecified chronic kidney disease: Secondary | ICD-10-CM | POA: Diagnosis not present

## 2023-10-11 NOTE — Assessment & Plan Note (Signed)
 Taking both rosuvastatin and atorvastatin , d/c atorvastatin . LDL at goal.

## 2023-10-11 NOTE — Patient Instructions (Signed)
 VISIT SUMMARY:  During your visit, we reviewed your blood pressure, kidney function, and medications. Your blood pressure is well-controlled, but there are concerns about your kidney function and urinary symptoms.  YOUR PLAN:  CHRONIC KIDNEY DISEASE: Your kidney function has been fluctuating, with a recent increase in creatinine levels. -You will follow up with Dr. Kitty Perkins in three months. -We are referring you to a urologist to evaluate potential kidney stress due to incomplete bladder emptying.  ANEMIA IN CHRONIC KIDNEY DISEASE: You have mild anemia, which is common with chronic kidney disease. -Your iron levels are normal, so no additional treatment is needed at this time.  PROTEINURIA: Protein is present in your urine, which is expected with chronic kidney disease. -No specific treatment is needed at this time.  BENIGN PROSTATIC HYPERPLASIA WITH LOWER URINARY TRACT SYMPTOMS: You have a weak urine stream and are taking Flomax , but it is not consistently effective. -We are referring you to a urologist to evaluate potential kidney stress due to incomplete bladder emptying.  HYPERTENSION: Your blood pressure is well-controlled at 134/70 mmHg with amlodipine . -Continue taking amlodipine  as prescribed.  HYPERLIPIDEMIA: You are currently taking rosuvastatin and have stopped atorvastatin . -Discontinue atorvastatin . -Continue taking rosuvastatin as it is stronger.

## 2023-10-11 NOTE — Assessment & Plan Note (Signed)
 Saw Dr. Ethelyn Herbert this AM-no changes to plan. No plans for HD at this time.

## 2023-10-11 NOTE — Assessment & Plan Note (Signed)
Vit D is normal

## 2023-10-11 NOTE — Progress Notes (Signed)
 Subjective:     Patient ID: Javier Jews., male    DOB: Nov 02, 1975, 48 y.o.   MRN: [id number]  Chief Complaint  Patient presents with   Medical Management of Chronic Issues    Patient presents today for a month follow-up    HPI  Discussed the use of AI scribe software for clinical note transcription with the patient, who gave verbal consent to proceed.  History of Present Illness  Javier Viverette. is a 48 year old male with hypertension and kidney disease who presents for a blood pressure recheck and medication review.  His blood pressure is 134/70 mmHg, and he is tolerating amlodipine  well. Blood sugar levels are stable with minor fluctuations. He experiences a weak urine stream and is taking Flomax  with inconsistent results. Creatinine levels have increased to over 3.0, and recent labs show proteinuria. No medication refills are needed.     Health Maintenance Due  Topic Date Due   Medicare Annual Wellness (AWV)  Never done   Pneumococcal Vaccine 45-70 Years old (1 of 2 - PCV) Never done   FOOT EXAM  10/24/2019   OPHTHALMOLOGY EXAM  09/07/2022    Past Medical History:  Diagnosis Date   Acute osteomyelitis of right calcaneus (HCC)    s/p debiidement and wound vac 08-28-2019   Anemia of chronic renal failure, stage 3a (HCC) 09/10/2019   sees dr Servando Danger 09-23-2019 on chart   Charcot's joint of right ankle    Chronic heel ulcer, right, limited to breakdown of skin (HCC)    since march 2021, has wound vac since 10-27-2019 changed vac 10-12-2019   DM type 1 (diabetes mellitus, type 1) (HCC)    Erythropoietin  deficiency anemia 09/10/2019   Hyperkalemia    Hypertension    Iron deficiency anemia due to chronic blood loss 09/10/2019   Kidney disease    Neuropathy    feet and toes   Sleep apnea    cpap     Past Surgical History:  Procedure Laterality Date   ANKLE SURGERY Right    APPENDECTOMY  yrs ago   GRAFT APPLICATION Right 10/28/2019   Procedure: GRAFT  APPLICATION;  Surgeon: Camilo Cella, DPM;  Location: [facility];  Service: Podiatry;  Laterality: Right;   I & D EXTREMITY Right 10/16/2019   Procedure: IRRIGATION AND DEBRIDEMENT OF RIGHT HEEL WITH APPLICATION WOUND VAC;  Surgeon: Camilo Cella, DPM;  Location: [facility] [place];  Service: Podiatry;  Laterality: Right;   INCISION AND DRAINAGE OF WOUND Right 10/28/2019   Procedure: IRRIGATION AND DEBRIDEMENT WOUND RIGHT HEEL;  Surgeon: Camilo Cella, DPM;  Location: [facility];  Service: Podiatry;  Laterality: Right;   IRRIGATION AND DEBRIDEMENT FOOT Right 08/28/2019   Procedure: RIght foot wound incision and drainage, debridement and irrigation, bone biopsy, application of wound VAC;  Surgeon: Camilo Cella, DPM;  Location: [facility];  Service: Podiatry;  Laterality: Right;    Family History  Problem Relation Age of Onset   Heart failure Mother    Stroke Mother    Kidney failure Mother    Cancer Father 19       non hodgkin's lymphoma   Heart failure Father    Cancer Brother 82       unsure what type    Social History   Socioeconomic History   Marital status: Married    Spouse name: Secondary school teacher   Number of children: 1   Years  of education: 12   Highest education level: Some college, no degree  Occupational History   Not on file  Tobacco Use   Smoking status: Never   Smokeless tobacco: Never  Vaping Use   Vaping status: Never Used  Substance and Sexual Activity   Alcohol use: No   Drug use: Never   Sexual activity: Not Currently  Other Topics Concern   Not on file  Social History Narrative   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   Married   One son 2009   Completed some college   No pets   Enjoys watching his son's sports   Social Drivers of Corporate investment banker Strain: Low Risk  (09/07/2023)   Overall Financial Resource Strain (CARDIA)    Difficulty of Paying Living Expenses: Not very hard   Food Insecurity: No Food Insecurity (09/07/2023)   Hunger Vital Sign    Worried About Running Out of Food in the Last Year: Never true    Ran Out of Food in the Last Year: Never true  Transportation Needs: No Transportation Needs (09/07/2023)   PRAPARE - Administrator, Civil Service (Medical): No    Lack of Transportation (Non-Medical): No  Physical Activity: Unknown (09/07/2023)   Exercise Vital Sign    Days of Exercise per Week: 0 days    Minutes of Exercise per Session: Not on file  Stress: Stress Concern Present (09/07/2023)   [employer] of Occupational Health - Occupational Stress Questionnaire    Feeling of Stress : To some extent  Social Connections: Moderately Integrated (09/07/2023)   Social Connection and Isolation Panel [NHANES]    Frequency of Communication with Friends and Family: Three times a week    Frequency of Social Gatherings with Friends and Family: Patient declined    Attends Religious Services: More than 4 times per year    Active Member of [employer] or Organizations: No    Attends Engineer, structural: Not on file    Marital Status: Married  Intimate Partner Violence: Unknown (09/08/2021)   Received from [employer], [facility]   HITS    Physically Hurt: Not on file    Insult or Talk Down To: Not on file    Threaten Physical Harm: Not on file    Scream or Curse: Not on file    Outpatient Medications Prior to Visit  Medication Sig Dispense Refill   amLODipine  (NORVASC ) 5 MG tablet Take 1 tablet (5 mg total) by mouth daily. 30 tablet 3   aspirin  81 MG tablet Take 81 mg by mouth daily.     ezetimibe  (ZETIA ) 10 MG tablet Take 10 mg by mouth daily.     ferrous sulfate  325 (65 FE) MG tablet Take 1 tablet (325 mg total) by mouth 2 (two) times daily with a meal.  3   hydrALAZINE  (APRESOLINE ) 50 MG tablet Take 100 mg by mouth 3 (three) times daily.     Insulin  Human (INSULIN  PUMP) SOLN Inject 1 each into the skin daily. Humalog. Base: 36 units  per day.  1 unit per 10 grams of carbohydrates.  Basal rate varies usually 0.95     metoprolol  succinate (TOPROL -XL) 100 MG 24 hr tablet Take 100 mg by mouth daily.     olmesartan (BENICAR) 5 MG tablet Take 5 mg by mouth daily.     omeprazole (PRILOSEC) 40 MG capsule Take 40 mg by mouth daily.  3   rosuvastatin (  CRESTOR) 40 MG tablet Take 40 mg by mouth daily.     spironolactone (ALDACTONE) 25 MG tablet Take 25 mg by mouth daily.     tamsulosin  (FLOMAX ) 0.4 MG CAPS capsule Take 1 capsule (0.4 mg total) by mouth daily. 90 capsule 1   atorvastatin  (LIPITOR) 40 MG tablet Take 1 tablet (40 mg total) by mouth daily. 90 tablet 3   hydrALAZINE  (APRESOLINE ) 50 MG tablet Take 1 tablet (50 mg total) by mouth 3 (three) times daily. 270 tablet 3   No facility-administered medications prior to visit.    Allergies  Allergen Reactions   Lisinopril Cough   Keflex [Cephalexin] Rash    ROS See HPI    Objective:     Physical Exam Constitutional:      General: He is not in acute distress.    Appearance: He is well-developed.  HENT:     Head: Normocephalic and atraumatic.  Cardiovascular:     Rate and Rhythm: Normal rate and regular rhythm.     Heart sounds: No murmur heard. Pulmonary:     Effort: Pulmonary effort is normal. No respiratory distress.     Breath sounds: Normal breath sounds. No wheezing or rales.  Skin:    General: Skin is warm and dry.  Neurological:     Mental Status: He is alert and oriented to person, place, and time.  Psychiatric:        Behavior: Behavior normal.        Thought Content: Thought content normal.      BP 134/70   Pulse 66   Temp 97.8 F (36.6 C)   Ht 5\' 6"  (1.676 m)   Wt 212 lb 9.6 oz (96.4 kg)   SpO2 98%   BMI 34.31 kg/m  Wt Readings from Last 3 Encounters:  10/11/23 212 lb 9.6 oz (96.4 kg)  09/10/23 207 lb (93.9 kg)  12/15/20 203 lb (92.1 kg)       Assessment & Plan:   Problem List Items Addressed This Visit       Unprioritized    Type 1 diabetes mellitus with foot ulcer (HCC) (Chronic)   Continues to follow with orthopedics.        Relevant Medications   rosuvastatin (CRESTOR) 40 MG tablet   olmesartan (BENICAR) 5 MG tablet   CKD (chronic kidney disease) stage 4, GFR 15-29 ml/min (HCC) (Chronic)   Saw Dr. Ethelyn Herbert this AM-no changes to plan. No plans for HD at this time.       Vitamin D  deficiency   Vit D is normal.       Type 1 diabetes mellitus with retinopathy (HCC)   Had eye injection done this week per ophthalmology.       Relevant Medications   rosuvastatin (CRESTOR) 40 MG tablet   olmesartan (BENICAR) 5 MG tablet   Pure hypercholesterolemia   Taking both rosuvastatin and atorvastatin , d/c atorvastatin . LDL at goal.       Relevant Medications   rosuvastatin (CRESTOR) 40 MG tablet   olmesartan (BENICAR) 5 MG tablet   hydrALAZINE  (APRESOLINE ) 50 MG tablet   Primary hypertension   BP stable/improved with the addition of amlodipine . Continue same.  BP Readings from Last 3 Encounters:  10/11/23 134/70  09/10/23 (!) 165/69  11/15/20 (!) 158/86         Relevant Medications   rosuvastatin (CRESTOR) 40 MG tablet   olmesartan (BENICAR) 5 MG tablet   hydrALAZINE  (APRESOLINE ) 50 MG tablet   Benign prostatic hyperplasia  with weak urinary stream - Primary   No improvement in stream with flomax - will refer to Urology.       Relevant Orders   Ambulatory referral to Urology    I have discontinued Wyvonna Heidelberg Jr.'s atorvastatin . I am also having him maintain his aspirin , omeprazole, ezetimibe , ferrous sulfate , metoprolol  succinate, hydrALAZINE , spironolactone, tamsulosin , amLODipine , insulin  pump, rosuvastatin, olmesartan, and hydrALAZINE .  No orders of the defined types were placed in this encounter.

## 2023-10-11 NOTE — Assessment & Plan Note (Signed)
 Had eye injection done this week per ophthalmology.

## 2023-10-11 NOTE — Assessment & Plan Note (Signed)
 No improvement in stream with flomax - will refer to Urology.

## 2023-10-11 NOTE — Assessment & Plan Note (Signed)
 Continues to follow with orthopedics.

## 2023-10-11 NOTE — Assessment & Plan Note (Signed)
 BP stable/improved with the addition of amlodipine . Continue same.  BP Readings from Last 3 Encounters:  10/11/23 134/70  09/10/23 (!) 165/69  11/15/20 (!) 158/86

## 2023-10-11 NOTE — Assessment & Plan Note (Signed)
>>  ASSESSMENT AND PLAN FOR PURE HYPERCHOLESTEROLEMIA WRITTEN ON 10/11/2023 11:02 AM BY O'SULLIVAN, Heidi Maclin, NP  Taking both rosuvastatin and atorvastatin , d/c atorvastatin . LDL at goal.

## 2023-10-13 LAB — LAB REPORT - SCANNED
Alb/Creat Ratio, Ur: 1495 mg/g{creat}
Albumin, Urine POC: 814.9
Creatinine, POC: 54.5 mg/dL

## 2023-10-16 DIAGNOSIS — E13621 Other specified diabetes mellitus with foot ulcer: Secondary | ICD-10-CM | POA: Diagnosis not present

## 2023-10-16 DIAGNOSIS — M14671 Charcot's joint, right ankle and foot: Secondary | ICD-10-CM | POA: Diagnosis not present

## 2023-10-16 DIAGNOSIS — E11621 Type 2 diabetes mellitus with foot ulcer: Secondary | ICD-10-CM | POA: Diagnosis not present

## 2023-10-25 ENCOUNTER — Telehealth: Payer: Self-pay | Admitting: Family

## 2023-10-25 DIAGNOSIS — I10 Essential (primary) hypertension: Secondary | ICD-10-CM

## 2023-10-25 MED ORDER — AMLODIPINE BESYLATE 5 MG PO TABS: 5.0000 mg | ORAL_TABLET | Freq: Every day | ORAL | 1 refills | Status: AC

## 2023-10-25 NOTE — Telephone Encounter (Signed)
 Copied from CRM [id number]. Topic: Clinical - Medication Refill >> Oct 25, 2023  9:00 AM Jenice Mitts wrote: Medication: amLODipine  (NORVASC ) 5 MG tablet, 90day requested   Has the patient contacted their pharmacy? Yes (Agent: If no, request that the patient contact the pharmacy for the refill. If patient does not wish to contact the pharmacy document the reason why and proceed with request.) (Agent: If yes, when and what did the pharmacy advise?)  This is the patient's preferred pharmacy:    [facility] - [place], [place] - [address] [place] [address] Phone: [phone] Fax: [phone]  Is this the correct pharmacy for this prescription? Yes If no, delete pharmacy and type the correct one.   Has the prescription been filled recently? Yes  Is the patient out of the medication? Yes  Has the patient been seen for an appointment in the last year OR does the patient have an upcoming appointment? Yes  Can we respond through MyChart? Yes  Agent: Please be advised that Rx refills may take up to 3 business days. We ask that you follow-up with your pharmacy.

## 2023-10-29 ENCOUNTER — Ambulatory Visit (INDEPENDENT_AMBULATORY_CARE_PROVIDER_SITE_OTHER)

## 2023-10-29 VITALS — Ht 66.0 in | Wt 212.0 lb

## 2023-10-29 DIAGNOSIS — Z0000 Encounter for general adult medical examination without abnormal findings: Secondary | ICD-10-CM

## 2023-10-29 NOTE — Progress Notes (Signed)
 Subjective:   Javier Hunt. is a 48 y.o. who presents for a Medicare Wellness preventive visit.  As a reminder, Annual Wellness Visits don't include a physical exam, and some assessments may be limited, especially if this visit is performed virtually. We may recommend an in-person follow-up visit with your provider if needed.  Visit Complete: Virtual I connected with  Javier Hunt. on 10/29/23 by a audio enabled telemedicine application and verified that I am speaking with the correct person using two identifiers.  Patient Location: Home  Provider Location: Home Office  I discussed the limitations of evaluation and management by telemedicine. The patient expressed understanding and agreed to proceed.  Vital Signs: Because this visit was a virtual/telehealth visit, some criteria may be missing or patient reported. Any vitals not documented were not able to be obtained and vitals that have been documented are patient reported.  VideoDeclined- This patient declined Librarian, academic. Therefore the visit was completed with audio only.  Persons Participating in Visit: Patient.  AWV Questionnaire: Yes: Patient Medicare AWV questionnaire was completed by the patient on 10/23/23; I have confirmed that all information answered by patient is correct and no changes since this date.  Cardiac Risk Factors include: diabetes mellitus;male gender;dyslipidemia;hypertension     Objective:     Today's Vitals   10/29/23 0913  Weight: 212 lb (96.2 kg)  Height: 5\' 6"  (1.676 m)   Body mass index is 34.22 kg/m.     10/29/2023    9:25 AM 10/15/2020   12:07 PM 10/14/2020    1:44 PM 10/28/2019   11:19 AM 10/16/2019   12:05 PM 10/15/2019    8:55 AM 09/10/2019   11:25 AM  Advanced Directives  Does Patient Have a Medical Advance Directive? No No No No No No No  Would patient like information on creating a medical advance directive? Yes (MAU/Ambulatory/Procedural  Areas - Information given) No - Patient declined No - Patient declined No - Patient declined No - Patient declined No - Patient declined No - Patient declined    Current Medications (verified) Outpatient Encounter Medications as of 10/29/2023  Medication Sig   amLODipine  (NORVASC ) 5 MG tablet Take 1 tablet (5 mg total) by mouth daily.   aspirin  81 MG tablet Take 81 mg by mouth daily.   ezetimibe  (ZETIA ) 10 MG tablet Take 10 mg by mouth daily.   ferrous sulfate  325 (65 FE) MG tablet Take 1 tablet (325 mg total) by mouth 2 (two) times daily with a meal.   hydrALAZINE  (APRESOLINE ) 50 MG tablet Take 100 mg by mouth 3 (three) times daily.   Insulin  Human (INSULIN  PUMP) SOLN Inject 1 each into the skin daily. Humalog. Base: 36 units per day.  1 unit per 10 grams of carbohydrates.  Basal rate varies usually 0.95   metoprolol  succinate (TOPROL -XL) 100 MG 24 hr tablet Take 100 mg by mouth daily.   olmesartan (BENICAR) 5 MG tablet Take 5 mg by mouth daily.   omeprazole (PRILOSEC) 40 MG capsule Take 40 mg by mouth daily.   rosuvastatin (CRESTOR) 40 MG tablet Take 40 mg by mouth daily.   spironolactone (ALDACTONE) 25 MG tablet Take 25 mg by mouth daily.   tamsulosin  (FLOMAX ) 0.4 MG CAPS capsule Take 1 capsule (0.4 mg total) by mouth daily.   [DISCONTINUED] hydrALAZINE  (APRESOLINE ) 50 MG tablet Take 1 tablet (50 mg total) by mouth 3 (three) times daily.   No facility-administered encounter medications on file as  of 10/29/2023.    Allergies (verified) Lisinopril and Keflex [cephalexin]   History: Past Medical History:  Diagnosis Date   Acute osteomyelitis of right calcaneus (HCC)    s/p debiidement and wound vac 08-28-2019   Allergy    Anemia of chronic renal failure, stage 3a (HCC) 09/10/2019   sees dr Servando Danger 09-23-2019 on chart   Blood transfusion without reported diagnosis    Charcot's joint of right ankle    Chronic heel ulcer, right, limited to breakdown of skin (HCC)    since march  2021, has wound vac since 10-27-2019 changed vac 10-12-2019   Clotting disorder (HCC)    DM type 1 (diabetes mellitus, type 1) (HCC)    Erythropoietin  deficiency anemia 09/10/2019   GERD (gastroesophageal reflux disease)    Hyperkalemia    Hypertension    Iron deficiency anemia due to chronic blood loss 09/10/2019   Kidney disease    Neuropathy    feet and toes   Sleep apnea    cpap    Ulcer    Past Surgical History:  Procedure Laterality Date   ANKLE SURGERY Right    APPENDECTOMY  yrs ago   EYE SURGERY     GRAFT APPLICATION Right 10/28/2019   Procedure: GRAFT APPLICATION;  Surgeon: Camilo Cella, DPM;  Location: [facility] [place];  Service: Podiatry;  Laterality: Right;   I & D EXTREMITY Right 10/16/2019   Procedure: IRRIGATION AND DEBRIDEMENT OF RIGHT HEEL WITH APPLICATION WOUND VAC;  Surgeon: Camilo Cella, DPM;  Location: [facility];  Service: Podiatry;  Laterality: Right;   INCISION AND DRAINAGE OF WOUND Right 10/28/2019   Procedure: IRRIGATION AND DEBRIDEMENT WOUND RIGHT HEEL;  Surgeon: Camilo Cella, DPM;  Location: [facility];  Service: Podiatry;  Laterality: Right;   IRRIGATION AND DEBRIDEMENT FOOT Right 08/28/2019   Procedure: RIght foot wound incision and drainage, debridement and irrigation, bone biopsy, application of wound VAC;  Surgeon: Camilo Cella, DPM;  Location: [facility];  Service: Podiatry;  Laterality: Right;   Family History  Problem Relation Age of Onset   Heart failure Mother    Stroke Mother    Kidney failure Mother    Diabetes Mother    Kidney disease Mother    Cancer Father        non hodgkin's lymphoma   Heart failure Father    Heart disease Father    Cancer Brother 33       unsure what type   Cancer Brother    Social History   Socioeconomic History   Marital status: Married    Spouse name: Secondary school teacher   Number of children: 1   Years of education: 12   Highest education level: Some college, no  degree  Occupational History   Not on file  Tobacco Use   Smoking status: Never   Smokeless tobacco: Never  Vaping Use   Vaping status: Never Used  Substance and Sexual Activity   Alcohol use: No   Drug use: Never   Sexual activity: Not Currently  Other Topics Concern   Not on file  Social History Narrative   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   Married   One son 2009   Completed some college   No pets   Enjoys watching his son's sports   Social Drivers of Corporate investment banker Strain: Low Risk  (10/29/2023)   Overall Financial Resource Strain (CARDIA)  Difficulty of Paying Living Expenses: Not hard at all  Food Insecurity: No Food Insecurity (10/29/2023)   Hunger Vital Sign    Worried About Running Out of Food in the Last Year: Never true    Ran Out of Food in the Last Year: Never true  Transportation Needs: No Transportation Needs (10/29/2023)   PRAPARE - Administrator, Civil Service (Medical): No    Lack of Transportation (Non-Medical): No  Physical Activity: Inactive (10/29/2023)   Exercise Vital Sign    Days of Exercise per Week: 0 days    Minutes of Exercise per Session: 0 min  Stress: Stress Concern Present (10/29/2023)   [employer] of Occupational Health - Occupational Stress Questionnaire    Feeling of Stress : To some extent  Social Connections: Moderately Integrated (10/29/2023)   Social Connection and Isolation Panel [NHANES]    Frequency of Communication with Friends and Family: Three times a week    Frequency of Social Gatherings with Friends and Family: Patient declined    Attends Religious Services: More than 4 times per year    Active Member of [employer] or Organizations: No    Attends Engineer, structural: Never    Marital Status: Married    Tobacco Counseling Counseling given: Not Answered    Clinical Intake:  Pre-visit preparation completed: Yes  Pain : No/denies pain     Diabetes:  Yes CBG done?: No Did pt. bring in CBG monitor from home?: No  Lab Results  Component Value Date   HGBA1C 6.4 09/10/2023   HGBA1C 6.9 (H) 08/12/2019     How often do you need to have someone help you when you read instructions, pamphlets, or other written materials from your doctor or pharmacy?: 1 - Never  Interpreter Needed?: No  Information entered by :: Seabron Cypress LPN   Activities of Daily Living     10/23/2023   11:32 AM  In your present state of health, do you have any difficulty performing the following activities:  Hearing? 0  Vision? 0  Difficulty concentrating or making decisions? 0  Walking or climbing stairs? 1  Dressing or bathing? 0  Doing errands, shopping? 0  Preparing Food and eating ? N  Using the Toilet? N  In the past six months, have you accidently leaked urine? N  Do you have problems with loss of bowel control? N  Managing your Medications? N  Managing your Finances? N  Housekeeping or managing your Housekeeping? N    Patient Care Team: Dorrene Gaucher, NP as PCP - General (Internal Medicine) Audery Blazing Deannie Fabian, MD as PCP - Cardiology (Cardiology) Alvis Jourdain, MD as Consulting Physician (Gastroenterology) [facility], [place], [facility], [place] (Specialist) Tasia Farr, MD as Referring Physician (Endocrinology)  Indicate any recent Medical Services you may have received from other than [facility] providers in the past year (date may be approximate).     Assessment:    This is a routine wellness examination for Erman.  Hearing/Vision screen Hearing Screening - Comments:: Denies hearing difficulties   Vision Screening - Comments:: Wears rx glasses - up to date with routine eye exams with [facility]    Goals Addressed               This Visit's Progress     Manage Charcot's (pt-stated)   On track      Depression Screen     10/29/2023    9:16 AM 09/10/2023  12:53 PM  PHQ 2/9 Scores   PHQ - 2 Score 2 2  PHQ- 9 Score 12 12    Fall Risk     10/23/2023   11:32 AM  Fall Risk   Falls in the past year? 0  Number falls in past yr: 0  Injury with Fall? 0  Risk for fall due to : Impaired mobility  Follow up Falls prevention discussed;Education provided;Falls evaluation completed    MEDICARE RISK AT HOME:  Medicare Risk at Home Any stairs in or around the home?: (Patient-Rptd) Yes If so, are there any without handrails?: (Patient-Rptd) No Home free of loose throw rugs in walkways, pet beds, electrical cords, etc?: (Patient-Rptd) Yes Adequate lighting in your home to reduce risk of falls?: (Patient-Rptd) Yes Life alert?: (Patient-Rptd) No Use of a cane, walker or w/c?: (Patient-Rptd) No Grab bars in the bathroom?: (Patient-Rptd) Yes Shower chair or bench in shower?: (Patient-Rptd) Yes Elevated toilet seat or a handicapped toilet?: (Patient-Rptd) Yes  TIMED UP AND GO:  Was the test performed?  No  Cognitive Function: 6CIT completed        10/29/2023    9:28 AM  6CIT Screen  What Year? 0 points  What month? 0 points  What time? 0 points  Count back from 20 0 points  Months in reverse 0 points  Repeat phrase 0 points  Total Score 0 points    Immunizations Immunization History  Administered Date(s) Administered   Influenza Inj Mdck Quad Pf 04/26/2023   Influenza, High Dose Seasonal PF 02/12/2018   Influenza, Quadrivalent, Recombinant, Inj, Pf 04/04/2017, 05/05/2020   Tdap 01/07/2014    Screening Tests Health Maintenance  Topic Date Due   Pneumococcal Vaccine 68-35 Years old (1 of 2 - PCV) Never done   FOOT EXAM  10/24/2019   OPHTHALMOLOGY EXAM  09/07/2022   COVID-19 Vaccine (1 - 2024-25 season) 06/03/2024 (Originally 02/03/2023)   INFLUENZA VACCINE  01/03/2024   DTaP/Tdap/Td (2 - Td or Tdap) 01/08/2024   HEMOGLOBIN A1C  03/11/2024   Diabetic kidney evaluation - eGFR measurement  09/09/2024   Diabetic kidney evaluation - Urine ACR  09/09/2024    Medicare Annual Wellness (AWV)  10/28/2024   Colonoscopy  10/08/2026   Hepatitis C Screening  Completed   HIV Screening  Completed   HPV VACCINES  Aged Out   Meningococcal B Vaccine  Aged Out    Health Maintenance  Health Maintenance Due  Topic Date Due   Pneumococcal Vaccine 39-69 Years old (1 of 2 - PCV) Never done   FOOT EXAM  10/24/2019   OPHTHALMOLOGY EXAM  09/07/2022   Health Maintenance Items Addressed: Diabetic eye exam notes requested   Additional Screening:  Vision Screening: Recommended annual ophthalmology exams for early detection of glaucoma and other disorders of the eye.  Dental Screening: Recommended annual dental exams for proper oral hygiene  Community Resource Referral / Chronic Care Management: CRR required this visit?  No   CCM required this visit?  No   Plan:    I have personally reviewed and noted the following in the patient's chart:   Medical and social history Use of alcohol, tobacco or illicit drugs  Current medications and supplements including opioid prescriptions. Patient is not currently taking opioid prescriptions. Functional ability and status Nutritional status Physical activity Advanced directives List of other physicians Hospitalizations, surgeries, and ER visits in previous 12 months Vitals Screenings to include cognitive, depression, and falls Referrals and appointments  In addition, I have  reviewed and discussed with patient certain preventive protocols, quality metrics, and best practice recommendations. A written personalized care plan for preventive services as well as general preventive health recommendations were provided to patient.   Seabron Cypress [place], [place]   09/10/8117   After Visit Summary: (MyChart) Due to this being a telephonic visit, the after visit summary with patients personalized plan was offered to patient via MyChart   Notes: Nothing significant to report at this time.

## 2023-10-29 NOTE — Patient Instructions (Signed)
 Javier Hunt , Thank you for taking time out of your busy schedule to complete your Annual Wellness Visit with me. I enjoyed our conversation and look forward to speaking with you again next year. I, as well as your care team,  appreciate your ongoing commitment to your health goals. Please review the following plan we discussed and let me know if I can assist you in the future. Your Game plan/ To Do List    Follow up Visits: Next Medicare AWV with our clinical staff: In 1 year    Have you seen your provider in the last 6 months (3 months if uncontrolled diabetes)? Yes Next Office Visit with your provider: 02/11/24 @ 9:20  Clinician Recommendations:  Aim for 30 minutes of exercise or brisk walking, 6-8 glasses of water, and 5 servings of fruits and vegetables each day.       This is a list of the screening recommended for you and due dates:  Health Maintenance  Topic Date Due   Pneumococcal Vaccination (1 of 2 - PCV) Never done   Complete foot exam   10/24/2019   Eye exam for diabetics  09/07/2022   COVID-19 Vaccine (1 - 2024-25 season) 06/03/2024*   Flu Shot  01/03/2024   DTaP/Tdap/Td vaccine (2 - Td or Tdap) 01/08/2024   Hemoglobin A1C  03/11/2024   Yearly kidney function blood test for diabetes  09/09/2024   Yearly kidney health urinalysis for diabetes  09/09/2024   Medicare Annual Wellness Visit  10/28/2024   Colon Cancer Screening  10/08/2026   Hepatitis C Screening  Completed   HIV Screening  Completed   HPV Vaccine  Aged Out   Meningitis B Vaccine  Aged Out  *Topic was postponed. The date shown is not the original due date.    Advanced directives: (ACP Link)Information on Advanced Care Planning can be found at [place]  Secretary of [facility] Advance Health Care Directives Advance Health Care Directives. [email or web address]   Advance Care Planning is important because it:  [x]  Makes sure you receive the medical care that is consistent with your values, goals, and preferences  [x]  It  provides guidance to your family and loved ones and reduces their decisional burden about whether or not they are making the right decisions based on your wishes.  Follow the link provided in your after visit summary or read over the paperwork we have mailed to you to help you started getting your Advance Directives in place. If you need assistance in completing these, please reach out to us  so that we can help you!  See attachments for Preventive Care and Fall Prevention Tips.

## 2023-10-30 ENCOUNTER — Encounter: Payer: Self-pay | Admitting: Gastroenterology

## 2023-11-06 ENCOUNTER — Encounter (INDEPENDENT_AMBULATORY_CARE_PROVIDER_SITE_OTHER): Admitting: Ophthalmology

## 2023-11-06 DIAGNOSIS — E103513 Type 1 diabetes mellitus with proliferative diabetic retinopathy with macular edema, bilateral: Secondary | ICD-10-CM

## 2023-11-06 DIAGNOSIS — H43813 Vitreous degeneration, bilateral: Secondary | ICD-10-CM | POA: Diagnosis not present

## 2023-11-06 DIAGNOSIS — I10 Essential (primary) hypertension: Secondary | ICD-10-CM

## 2023-11-06 DIAGNOSIS — Z794 Long term (current) use of insulin: Secondary | ICD-10-CM

## 2023-11-06 DIAGNOSIS — H35033 Hypertensive retinopathy, bilateral: Secondary | ICD-10-CM | POA: Diagnosis not present

## 2023-11-07 DIAGNOSIS — E1065 Type 1 diabetes mellitus with hyperglycemia: Secondary | ICD-10-CM | POA: Diagnosis not present

## 2023-11-07 DIAGNOSIS — E109 Type 1 diabetes mellitus without complications: Secondary | ICD-10-CM | POA: Diagnosis not present

## 2023-11-07 DIAGNOSIS — Z794 Long term (current) use of insulin: Secondary | ICD-10-CM | POA: Diagnosis not present

## 2023-11-13 DIAGNOSIS — M14679 Charcot's joint, unspecified ankle and foot: Secondary | ICD-10-CM | POA: Diagnosis not present

## 2023-11-13 DIAGNOSIS — E7800 Pure hypercholesterolemia, unspecified: Secondary | ICD-10-CM | POA: Diagnosis not present

## 2023-11-13 DIAGNOSIS — Z9641 Presence of insulin pump (external) (internal): Secondary | ICD-10-CM | POA: Diagnosis not present

## 2023-11-13 DIAGNOSIS — I10 Essential (primary) hypertension: Secondary | ICD-10-CM | POA: Diagnosis not present

## 2023-11-13 DIAGNOSIS — E1065 Type 1 diabetes mellitus with hyperglycemia: Secondary | ICD-10-CM | POA: Diagnosis not present

## 2023-11-13 DIAGNOSIS — E559 Vitamin D deficiency, unspecified: Secondary | ICD-10-CM | POA: Diagnosis not present

## 2023-11-15 DIAGNOSIS — E13621 Other specified diabetes mellitus with foot ulcer: Secondary | ICD-10-CM | POA: Diagnosis not present

## 2023-11-15 DIAGNOSIS — E11621 Type 2 diabetes mellitus with foot ulcer: Secondary | ICD-10-CM | POA: Diagnosis not present

## 2023-11-15 DIAGNOSIS — M14671 Charcot's joint, right ankle and foot: Secondary | ICD-10-CM | POA: Diagnosis not present

## 2023-11-19 ENCOUNTER — Encounter: Payer: Self-pay | Admitting: Urology

## 2023-11-19 ENCOUNTER — Ambulatory Visit (INDEPENDENT_AMBULATORY_CARE_PROVIDER_SITE_OTHER): Admitting: Urology

## 2023-11-19 ENCOUNTER — Telehealth: Payer: Self-pay

## 2023-11-19 VITALS — BP 133/81 | HR 67 | Ht 66.0 in | Wt 215.0 lb

## 2023-11-19 DIAGNOSIS — N529 Male erectile dysfunction, unspecified: Secondary | ICD-10-CM | POA: Diagnosis not present

## 2023-11-19 DIAGNOSIS — N401 Enlarged prostate with lower urinary tract symptoms: Secondary | ICD-10-CM

## 2023-11-19 DIAGNOSIS — N138 Other obstructive and reflux uropathy: Secondary | ICD-10-CM | POA: Diagnosis not present

## 2023-11-19 LAB — URINALYSIS, ROUTINE W REFLEX MICROSCOPIC
Bilirubin, UA: NEGATIVE
Glucose, UA: NEGATIVE
Ketones, UA: NEGATIVE
Leukocytes,UA: NEGATIVE
Nitrite, UA: NEGATIVE
RBC, UA: NEGATIVE
Specific Gravity, UA: 1.02 (ref 1.005–1.030)
Urobilinogen, Ur: 0.2 mg/dL (ref 0.2–1.0)
pH, UA: 5.5 (ref 5.0–7.5)

## 2023-11-19 LAB — MICROSCOPIC EXAMINATION

## 2023-11-19 LAB — BLADDER SCAN AMB NON-IMAGING: Scan Result: 12

## 2023-11-19 MED ORDER — ALFUZOSIN HCL ER 10 MG PO TB24: 10.0000 mg | ORAL_TABLET | Freq: Every day | ORAL | 11 refills | Status: DC

## 2023-11-19 MED ORDER — TADALAFIL 20 MG PO TABS: 20.0000 mg | ORAL_TABLET | Freq: Every day | ORAL | 11 refills | Status: AC | PRN

## 2023-11-19 NOTE — Telephone Encounter (Signed)
 Pt pharmacy sent a request for a drug change. You gave pt tadalafil 20mg  prn. Please advise.

## 2023-11-19 NOTE — Progress Notes (Signed)
 Assessment: 1. BPH with obstruction/lower urinary tract symptoms   2. Organic impotence    Plan: I personally reviewed the patient's chart including provider notes, and lab results. Trial of alfuzosin 10 mg daily in place of tamsulosin . Trial of tadalafil 20 mg prn.  Rx sent. Return to office in 6 weeks  Chief Complaint:  Chief Complaint  Patient presents with   Benign Prostatic Hypertrophy    History of Present Illness:  Javier Hunt. is a 48 y.o. male who is seen in consultation from Dorrene Gaucher, NP for evaluation of BPH with lower urinary tract symptoms.   He reports lower urinary tract symptoms for 3-4 years.  He is urinary symptoms include decreased stream, bladder pressure, occasional hesitancy, and sensation of incomplete emptying.  No daytime frequency or urgency.  He has nocturia x 1.  No dysuria or gross hematuria.  No history of UTIs.  He was started on tamsulosin  0.4 mg daily in April 2025.  He has not seen a significant change in his symptoms with the medication. IPSS = 17/3.  PSA 4/25: 1.60  He also reports erectile dysfunction for 4 years.  He is able to achieve a partial erection which is not adequate for intercourse.  He has approximately 50% rigidity.  He has difficulty maintaining his erection as well.  He does report nocturnal erections which are better quality.  He previously took a medication which did help with his erections initially.  He has not tried anything for several years.  No decrease in his libido. Risk factors for ED include diabetes, hypertension, hypercholesterolemia, and beta-blocker use.  He has chronic kidney disease and is followed by Dr. Irene Mannheim.  Creatinine from 4/25 was 3.38.  Past Medical History:  Past Medical History:  Diagnosis Date   Acute osteomyelitis of right calcaneus (HCC)    s/p debiidement and wound vac 08-28-2019   Allergy    Anemia of chronic renal failure, stage 3a (HCC) 09/10/2019   sees dr Servando Danger 09-23-2019 on chart   Blood transfusion without reported diagnosis    Charcot's joint of right ankle    Chronic heel ulcer, right, limited to breakdown of skin (HCC)    since march 2021, has wound vac since 10-27-2019 changed vac 10-12-2019   Clotting disorder (HCC)    DM type 1 (diabetes mellitus, type 1) (HCC)    Erythropoietin  deficiency anemia 09/10/2019   GERD (gastroesophageal reflux disease)    Hyperkalemia    Hypertension    Iron deficiency anemia due to chronic blood loss 09/10/2019   Kidney disease    Neuropathy    feet and toes   Sleep apnea    cpap    Ulcer     Past Surgical History:  Past Surgical History:  Procedure Laterality Date   ANKLE SURGERY Right    APPENDECTOMY  yrs ago   EYE SURGERY     GRAFT APPLICATION Right 10/28/2019   Procedure: GRAFT APPLICATION;  Surgeon: Camilo Cella, DPM;  Location: [facility] [place];  Service: Podiatry;  Laterality: Right;   I & D EXTREMITY Right 10/16/2019   Procedure: IRRIGATION AND DEBRIDEMENT OF RIGHT HEEL WITH APPLICATION WOUND VAC;  Surgeon: Camilo Cella, DPM;  Location: [facility] [place];  Service: Podiatry;  Laterality: Right;   INCISION AND DRAINAGE OF WOUND Right 10/28/2019   Procedure: IRRIGATION AND DEBRIDEMENT WOUND RIGHT HEEL;  Surgeon: Camilo Cella, DPM;  Location: [facility];  Service: Podiatry;  Laterality: Right;   IRRIGATION AND DEBRIDEMENT FOOT Right 08/28/2019   Procedure: RIght foot wound incision and drainage, debridement and irrigation, bone biopsy, application of wound VAC;  Surgeon: Camilo Cella, DPM;  Location: [facility];  Service: Podiatry;  Laterality: Right;    Allergies:  Allergies  Allergen Reactions   Lisinopril Cough   Keflex [Cephalexin] Rash    Family History:  Family History  Problem Relation Age of Onset   Heart failure Mother    Stroke Mother    Kidney failure Mother    Diabetes Mother    Kidney disease Mother    Cancer Father         non hodgkin's lymphoma   Heart failure Father    Heart disease Father    Cancer Brother 33       unsure what type   Cancer Brother     Social History:  Social History   Tobacco Use   Smoking status: Never   Smokeless tobacco: Never  Vaping Use   Vaping status: Never Used  Substance Use Topics   Alcohol use: No   Drug use: Never    Review of symptoms:  Constitutional:  Negative for unexplained weight loss, night sweats, fever, chills ENT:  Negative for nose bleeds, sinus pain, painful swallowing CV:  Negative for chest pain, shortness of breath, exercise intolerance, palpitations, loss of consciousness Resp:  Negative for cough, wheezing, shortness of breath GI:  Negative for nausea, vomiting, diarrhea, bloody stools GU:  Positives noted in HPI; otherwise negative for gross hematuria, dysuria, urinary incontinence Neuro:  Negative for seizures, poor balance, limb weakness, slurred speech Psych:  Negative for lack of energy, depression, anxiety Endocrine:  Negative for polydipsia, polyuria, symptoms of hypoglycemia (dizziness, hunger, sweating) Hematologic:  Negative for anemia, purpura, petechia, prolonged or excessive bleeding, use of anticoagulants  Allergic:  Negative for difficulty breathing or choking as a result of exposure to anything; no shellfish allergy; no allergic response (rash/itch) to materials, foods  Physical exam: BP 133/81   Pulse 67   Ht 5' 6 (1.676 m)   Wt 215 lb (97.5 kg)   BMI 34.70 kg/m  GENERAL APPEARANCE:  Well appearing, well developed, well nourished, NAD HEENT: Atraumatic, Normocephalic, oropharynx clear. NECK: Supple without lymphadenopathy or thyromegaly. LUNGS: Clear to auscultation bilaterally. HEART: Regular Rate and Rhythm without murmurs, gallops, or rubs. ABDOMEN: Soft, non-tender, No Masses. EXTREMITIES: Moves all extremities well.  Without clubbing, cyanosis, or edema. NEUROLOGIC:  Alert and oriented x 3, normal gait, CN II-XII  grossly intact.  MENTAL STATUS:  Appropriate. BACK:  Non-tender to palpation.  No CVAT SKIN:  Warm, dry and intact.   GU: Penis:  circumcised Meatus: Normal Scrotum: normal, no masses Testis: normal without masses bilateral Prostate: 40 g, NT, no nodules Rectum: Normal tone,  no masses or tenderness   Results: U/A: 0-5 WBCs, 0-2 RBCs  PVR = 12 mL

## 2023-11-20 NOTE — Telephone Encounter (Signed)
 Pt is happy with using the GoodRx card. Pharmacy sent request without pt knowledge.

## 2023-11-29 DIAGNOSIS — E11621 Type 2 diabetes mellitus with foot ulcer: Secondary | ICD-10-CM | POA: Diagnosis not present

## 2023-11-29 DIAGNOSIS — M14671 Charcot's joint, right ankle and foot: Secondary | ICD-10-CM | POA: Diagnosis not present

## 2023-12-04 ENCOUNTER — Encounter (INDEPENDENT_AMBULATORY_CARE_PROVIDER_SITE_OTHER): Admitting: Ophthalmology

## 2023-12-04 DIAGNOSIS — Z794 Long term (current) use of insulin: Secondary | ICD-10-CM | POA: Diagnosis not present

## 2023-12-04 DIAGNOSIS — H43813 Vitreous degeneration, bilateral: Secondary | ICD-10-CM

## 2023-12-04 DIAGNOSIS — H2513 Age-related nuclear cataract, bilateral: Secondary | ICD-10-CM | POA: Diagnosis not present

## 2023-12-04 DIAGNOSIS — E103513 Type 1 diabetes mellitus with proliferative diabetic retinopathy with macular edema, bilateral: Secondary | ICD-10-CM | POA: Diagnosis not present

## 2023-12-04 DIAGNOSIS — H35033 Hypertensive retinopathy, bilateral: Secondary | ICD-10-CM

## 2023-12-04 DIAGNOSIS — I10 Essential (primary) hypertension: Secondary | ICD-10-CM | POA: Diagnosis not present

## 2023-12-17 DIAGNOSIS — Z794 Long term (current) use of insulin: Secondary | ICD-10-CM | POA: Diagnosis not present

## 2023-12-17 DIAGNOSIS — E1065 Type 1 diabetes mellitus with hyperglycemia: Secondary | ICD-10-CM | POA: Diagnosis not present

## 2023-12-17 DIAGNOSIS — E109 Type 1 diabetes mellitus without complications: Secondary | ICD-10-CM | POA: Diagnosis not present

## 2023-12-23 DIAGNOSIS — E11621 Type 2 diabetes mellitus with foot ulcer: Secondary | ICD-10-CM | POA: Diagnosis not present

## 2023-12-23 DIAGNOSIS — M14671 Charcot's joint, right ankle and foot: Secondary | ICD-10-CM | POA: Diagnosis not present

## 2023-12-31 ENCOUNTER — Telehealth: Payer: Self-pay | Admitting: Family

## 2023-12-31 ENCOUNTER — Ambulatory Visit (INDEPENDENT_AMBULATORY_CARE_PROVIDER_SITE_OTHER): Admitting: Family

## 2023-12-31 ENCOUNTER — Ambulatory Visit: Admitting: Urology

## 2023-12-31 ENCOUNTER — Telehealth: Payer: Self-pay | Admitting: Neurology

## 2023-12-31 VITALS — BP 125/52 | HR 67 | Temp 97.8°F | Resp 16 | Ht 66.0 in | Wt 214.0 lb

## 2023-12-31 DIAGNOSIS — E1022 Type 1 diabetes mellitus with diabetic chronic kidney disease: Secondary | ICD-10-CM

## 2023-12-31 DIAGNOSIS — Z0000 Encounter for general adult medical examination without abnormal findings: Secondary | ICD-10-CM | POA: Diagnosis not present

## 2023-12-31 DIAGNOSIS — Z23 Encounter for immunization: Secondary | ICD-10-CM | POA: Diagnosis not present

## 2023-12-31 DIAGNOSIS — D509 Iron deficiency anemia, unspecified: Secondary | ICD-10-CM

## 2023-12-31 DIAGNOSIS — E11621 Type 2 diabetes mellitus with foot ulcer: Secondary | ICD-10-CM

## 2023-12-31 DIAGNOSIS — I129 Hypertensive chronic kidney disease with stage 1 through stage 4 chronic kidney disease, or unspecified chronic kidney disease: Secondary | ICD-10-CM

## 2023-12-31 DIAGNOSIS — R3912 Poor urinary stream: Secondary | ICD-10-CM

## 2023-12-31 DIAGNOSIS — E875 Hyperkalemia: Secondary | ICD-10-CM

## 2023-12-31 DIAGNOSIS — I10 Essential (primary) hypertension: Secondary | ICD-10-CM

## 2023-12-31 DIAGNOSIS — E7800 Pure hypercholesterolemia, unspecified: Secondary | ICD-10-CM | POA: Diagnosis not present

## 2023-12-31 DIAGNOSIS — E559 Vitamin D deficiency, unspecified: Secondary | ICD-10-CM | POA: Diagnosis not present

## 2023-12-31 DIAGNOSIS — E1042 Type 1 diabetes mellitus with diabetic polyneuropathy: Secondary | ICD-10-CM | POA: Diagnosis not present

## 2023-12-31 DIAGNOSIS — N401 Enlarged prostate with lower urinary tract symptoms: Secondary | ICD-10-CM | POA: Diagnosis not present

## 2023-12-31 DIAGNOSIS — L97411 Non-pressure chronic ulcer of right heel and midfoot limited to breakdown of skin: Secondary | ICD-10-CM

## 2023-12-31 DIAGNOSIS — F418 Other specified anxiety disorders: Secondary | ICD-10-CM | POA: Diagnosis not present

## 2023-12-31 DIAGNOSIS — N184 Chronic kidney disease, stage 4 (severe): Secondary | ICD-10-CM

## 2023-12-31 LAB — BASIC METABOLIC PANEL WITH GFR
BUN: 44 mg/dL — ABNORMAL HIGH (ref 6–23)
CO2: 21 meq/L (ref 19–32)
Calcium: 8.9 mg/dL (ref 8.4–10.5)
Chloride: 103 meq/L (ref 96–112)
Creatinine, Ser: 3.54 mg/dL — ABNORMAL HIGH (ref 0.40–1.50)
GFR: 19.43 mL/min — ABNORMAL LOW (ref >60.00)
Glucose, Bld: 172 mg/dL — ABNORMAL HIGH (ref 70–99)
Potassium: 6.1 meq/L (ref 3.5–5.1)
Sodium: 132 meq/L — ABNORMAL LOW (ref 135–145)

## 2023-12-31 NOTE — Assessment & Plan Note (Signed)
 Has a callus which is being followed by Dr. Patric team every 3 weeks.

## 2023-12-31 NOTE — Assessment & Plan Note (Signed)
 He reports that this is stable without medication.

## 2023-12-31 NOTE — Progress Notes (Signed)
 Subjective:     Patient ID: Javier SHAUNNA Veatrice Mickey., male    DOB: 1976-04-03, 48 y.o.   MRN: [id number]  Chief Complaint  Patient presents with   Annual Exam    HPI  Discussed the use of AI scribe software for clinical note transcription with the patient, who gave verbal consent to proceed.  History of Present Illness  Javier Heidenreich. is a 48 year old male who presents for an annual physical exam.  He has diabetes managed with an insulin  pump and a recent A1c of 6.4. He follows with an endocrinologist. He takes rosuvastatin for hyperlipidemia after discontinuing atorvastatin . Blood pressure is controlled with amlodipine , hydralazine , Benicar, Aldactone, and metoprolol . He experiences persistent leg and foot swelling.  He has benign prostatic hyperplasia and was recently switched from Flomax  to a different medication to improve urinary flow, with minimal improvement. A follow-up with the urologist is scheduled for August 21st. No dysuria or unusual frequency.  He experiences neuropathy in his feet, previously more severe. A callus on his foot is monitored by a podiatrist every three weeks. He is awaiting diabetic shoes and inserts due to insurance delays. No significant pain from neuropathy currently and has not been on gabapentin.  He denies depression and anxiety and reports a generally healthy mood. He has had a recent colonoscopy and is up to date on dental and vision care. Occasional constipation but no significant digestive concerns. No joint pain other than in his foot and does not experience frequent headaches.  He reports a diet that includes frequent fast food due to a busy schedule with his son's basketball activities, acknowledging the challenge of maintaining a healthy diet.  Immunizations: Prevnar and Hep B today Diet:  eats out a lot Exercise: limited due to his foot Colonoscopy: 10/08/23 Vision:  up to date Lab Results  Component Value Date   PSA 1.60 09/10/2023   Dental: up to date       Health Maintenance Due  Topic Date Due   OPHTHALMOLOGY EXAM  09/07/2022    Past Medical History:  Diagnosis Date   Acute osteomyelitis of right calcaneus (HCC)    s/p debiidement and wound vac 08-28-2019   Allergy    Anemia of chronic renal failure, stage 3a (HCC) 09/10/2019   sees dr jolee ronco 09-23-2019 on chart   Blood transfusion without reported diagnosis    Charcot's joint of right ankle    Chronic heel ulcer, right, limited to breakdown of skin (HCC)    since march 2021, has wound vac since 10-27-2019 changed vac 10-12-2019   Clotting disorder (HCC)    DM type 1 (diabetes mellitus, type 1) (HCC)    Erythropoietin  deficiency anemia 09/10/2019   GERD (gastroesophageal reflux disease)    Hyperkalemia    Hypertension    Iron deficiency anemia due to chronic blood loss 09/10/2019   Kidney disease    Neuropathy    feet and toes   Sleep apnea    cpap    Ulcer     Past Surgical History:  Procedure Laterality Date   ANKLE SURGERY Right    APPENDECTOMY  yrs ago   EYE SURGERY     GRAFT APPLICATION Right 10/28/2019   Procedure: GRAFT APPLICATION;  Surgeon: Gretel Ozell PARAS, DPM;  Location: [facility] ;  Service: Podiatry;  Laterality: Right;   I & D EXTREMITY Right 10/16/2019   Procedure: IRRIGATION AND DEBRIDEMENT OF RIGHT HEEL WITH APPLICATION WOUND VAC;  Surgeon:  Gretel Ozell PARAS, DPM;  Location: [facility];  Service: Podiatry;  Laterality: Right;   INCISION AND DRAINAGE OF WOUND Right 10/28/2019   Procedure: IRRIGATION AND DEBRIDEMENT WOUND RIGHT HEEL;  Surgeon: Gretel Ozell PARAS, DPM;  Location: [facility];  Service: Podiatry;  Laterality: Right;   IRRIGATION AND DEBRIDEMENT FOOT Right 08/28/2019   Procedure: RIght foot wound incision and drainage, debridement and irrigation, bone biopsy, application of wound VAC;  Surgeon: Gretel Ozell PARAS, DPM;  Location: [facility];  Service: Podiatry;   Laterality: Right;    Family History  Problem Relation Age of Onset   Heart failure Mother    Stroke Mother    Kidney failure Mother    Diabetes Mother    Kidney disease Mother    Cancer Father        non hodgkin's lymphoma   Heart failure Father    Heart disease Father    Cancer Brother 23       unsure what type   Cancer Brother     Social History   Socioeconomic History   Marital status: Married    Spouse name: Secondary school teacher   Number of children: 1   Years of education: 12   Highest education level: Some college, no degree  Occupational History   Not on file  Tobacco Use   Smoking status: Never   Smokeless tobacco: Never  Vaping Use   Vaping status: Never Used  Substance and Sexual Activity   Alcohol use: No   Drug use: Never   Sexual activity: Not Currently  Other Topics Concern   Not on file  Social History Narrative   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   Married   One son 2009   Completed some college   No pets   Enjoys watching his son's sports   Social Drivers of Corporate investment banker Strain: Medium Risk (12/30/2023)   Overall Financial Resource Strain (CARDIA)    Difficulty of Paying Living Expenses: Somewhat hard  Food Insecurity: No Food Insecurity (12/30/2023)   Hunger Vital Sign    Worried About Running Out of Food in the Last Year: Never true    Ran Out of Food in the Last Year: Never true  Transportation Needs: No Transportation Needs (12/30/2023)   PRAPARE - Administrator, Civil Service (Medical): No    Lack of Transportation (Non-Medical): No  Physical Activity: Inactive (12/30/2023)   Exercise Vital Sign    Days of Exercise per Week: 0 days    Minutes of Exercise per Session: Not on file  Stress: Stress Concern Present (12/30/2023)   [employer] of Occupational Health - Occupational Stress Questionnaire    Feeling of Stress: Rather much  Social Connections: Moderately Integrated (12/30/2023)    Social Connection and Isolation Panel    Frequency of Communication with Friends and Family: Twice a week    Frequency of Social Gatherings with Friends and Family: Once a week    Attends Religious Services: More than 4 times per year    Active Member of [employer] or Organizations: No    Attends Banker Meetings: Not on file    Marital Status: Married  Catering manager Violence: Not At Risk (10/29/2023)   Humiliation, Afraid, Rape, and Kick questionnaire    Fear of Current or Ex-Partner: No    Emotionally Abused: No    Physically Abused: No    Sexually Abused: No  Outpatient Medications Prior to Visit  Medication Sig Dispense Refill   alfuzosin  (UROXATRAL ) 10 MG 24 hr tablet Take 1 tablet (10 mg total) by mouth daily. 30 tablet 11   amLODipine  (NORVASC ) 5 MG tablet Take 1 tablet (5 mg total) by mouth daily. 90 tablet 1   aspirin  81 MG tablet Take 81 mg by mouth daily.     ezetimibe  (ZETIA ) 10 MG tablet Take 10 mg by mouth daily.     ferrous sulfate  325 (65 FE) MG tablet Take 1 tablet (325 mg total) by mouth 2 (two) times daily with a meal.  3   hydrALAZINE  (APRESOLINE ) 50 MG tablet Take 100 mg by mouth 3 (three) times daily.     Insulin  Human (INSULIN  PUMP) SOLN Inject 1 each into the skin daily. Humalog. Base: 36 units per day.  1 unit per 10 grams of carbohydrates.  Basal rate varies usually 0.95     metoprolol  succinate (TOPROL -XL) 100 MG 24 hr tablet Take 100 mg by mouth daily.     olmesartan (BENICAR) 5 MG tablet Take 5 mg by mouth daily.     omeprazole (PRILOSEC) 40 MG capsule Take 40 mg by mouth daily.  3   rosuvastatin (CRESTOR) 40 MG tablet Take 40 mg by mouth daily.     spironolactone (ALDACTONE) 25 MG tablet Take 25 mg by mouth daily.     tadalafil  (CIALIS ) 20 MG tablet Take 1 tablet (20 mg total) by mouth daily as needed. 10 tablet 11   No facility-administered medications prior to visit.    Allergies  Allergen Reactions   Lisinopril Cough   Keflex  [Cephalexin] Rash    Review of Systems  Constitutional:  Negative for weight loss.  HENT:  Negative for congestion and hearing loss.   Eyes:  Negative for blurred vision.  Respiratory:  Negative for cough.   Cardiovascular:  Positive for leg swelling.  Gastrointestinal:  Positive for constipation. Negative for diarrhea.  Genitourinary:  Negative for dysuria and frequency.  Musculoskeletal:  Negative for joint pain.  Skin:  Negative for rash.  Neurological:  Negative for headaches.  Psychiatric/Behavioral:  Negative for depression. The patient is not nervous/anxious.        Objective:    Physical Exam   BP (!) 125/52 (BP Location: Right Arm, Patient Position: Sitting, Cuff Size: Normal)   Pulse 67   Temp 97.8 F (36.6 C) (Oral)   Resp 16   Ht 5' 6 (1.676 m)   Wt 214 lb (97.1 kg)   SpO2 99%   BMI 34.54 kg/m  Wt Readings from Last 3 Encounters:  12/31/23 214 lb (97.1 kg)  11/19/23 215 lb (97.5 kg)  10/29/23 212 lb (96.2 kg)   Physical Exam  Constitutional: He is oriented to person, place, and time. He appears well-developed and well-nourished. No distress.  HENT:  Head: Normocephalic and atraumatic.  Right Ear: Tympanic membrane and ear canal normal.  Left Ear: Tympanic membrane and ear canal normal.  Mouth/Throat: Oropharynx is clear and moist.  Eyes: Pupils are equal, round, and reactive to light. No scleral icterus.  Neck: Normal range of motion. No thyromegaly present.  Cardiovascular: Normal rate and regular rhythm.   No murmur heard. Pulmonary/Chest: Effort normal and breath sounds normal. No respiratory distress. He has no wheezes. He has no rales. He exhibits no tenderness.  Abdominal: Soft. Bowel sounds are normal. He exhibits no distension and no mass. There is no tenderness. There is no rebound and no guarding.  Musculoskeletal: He exhibits no edema.  Lymphadenopathy:    He has no cervical adenopathy.  Neurological: He is alert and oriented to person,  place, and time. He has normal patellar reflexes. He exhibits normal muscle tone. Coordination normal.  Skin: Skin is warm and dry.  Psychiatric: He has a normal mood and affect. His behavior is normal. Judgment and thought content normal.           Assessment & Plan:       Assessment & Plan:   Problem List Items Addressed This Visit       Unprioritized   CKD (chronic kidney disease) stage 4, GFR 15-29 ml/min (HCC) (Chronic)   Followed by Nephrology- Dr. Starlin.      Vitamin D  deficiency   Vit D is normal.  Not on supplement       Type 1 diabetes mellitus with stage 4 chronic kidney disease and hypertension (HCC)   Continues to follow with neprhrology.  Lab Results  Component Value Date   HGBA1C 6.4 09/10/2023   HGBA1C 6.9 (H) 08/12/2019   Lab Results  Component Value Date   MICROALBUR 159.1 (H) 09/10/2023   LDLCALC 71 09/10/2023   CREATININE 3.38 (H) 09/10/2023   Has insulin  pump- followed by Dr. Tommas, A1C at goal       Primary hypertension   BP Readings from Last 3 Encounters:  12/31/23 (!) 125/52  11/19/23 133/81  10/11/23 134/70   Stable on current regimen, continue same.       Relevant Orders   Basic Metabolic Panel (BMET)   Preventative health care - Primary    Routine wellness visit focused on immunizations, screenings, and lifestyle. - Administer pneumonia vaccine due to diabetes. - Administer hepatitis B vaccine and schedule follow-up vaccine #2 in 1-2 months. - Encourage flu shot and COVID booster in September or October. - Discuss healthy diet and meal prep to reduce fast food consumption.      Iron deficiency anemia   Lab Results  Component Value Date   WBC 7.6 09/10/2023   HGB 12.3 (L) 09/10/2023   HCT 37.6 (L) 09/10/2023   MCV 81.8 09/10/2023   PLT 251.0 09/10/2023   Maintained on iron supplement.       Hyperlipidemia    >>ASSESSMENT AND PLAN FOR PURE HYPERCHOLESTEROLEMIA WRITTEN ON 12/31/2023 10:15 AM BY DARYL SETTER, NP  Lab Results  Component Value Date   CHOL 130 09/10/2023   HDL 37.80 (L) 09/10/2023   LDLCALC 71 09/10/2023   TRIG 105.0 09/10/2023   CHOLHDL 3 09/10/2023   On crestor, update Lipids today- last visit he was also taking Atorvastatin  which was d/c'd.        Diabetic neuropathy (HCC)   No pain- not currently on gabapentin.       Diabetic foot ulcer (HCC)   Has a callus which is being followed by Dr. Patric team every 3 weeks.       Depression with anxiety   He reports that this is stable without medication.       Benign prostatic hyperplasia with weak urinary stream   Saw Urology- they d/c'd flomax  and started Alfuzosin . Pt has not noted any improvement yet- has follow up with Urology.       Other Visit Diagnoses       Need for pneumococcal 20-valent conjugate vaccination       Relevant Orders   Pneumococcal conjugate vaccine 20-valent (Prevnar 20) (Completed)     Need for hepatitis B  vaccination       Relevant Orders   Heplisav-B  (HepB-CPG) Vaccine (Completed)       I am having Javier MYRTIS Veatrice Mickey. maintain his aspirin , omeprazole, ezetimibe , ferrous sulfate , metoprolol  succinate, spironolactone, insulin  pump, rosuvastatin, olmesartan, hydrALAZINE , amLODipine , alfuzosin , and tadalafil .  No orders of the defined types were placed in this encounter.

## 2023-12-31 NOTE — Assessment & Plan Note (Signed)
 Saw Urology- they d/c'd flomax  and started Alfuzosin . Pt has not noted any improvement yet- has follow up with Urology.

## 2023-12-31 NOTE — Assessment & Plan Note (Signed)
 Continues to follow with neprhrology.  Lab Results  Component Value Date   HGBA1C 6.4 09/10/2023   HGBA1C 6.9 (H) 08/12/2019   Lab Results  Component Value Date   MICROALBUR 159.1 (H) 09/10/2023   LDLCALC 71 09/10/2023   CREATININE 3.38 (H) 09/10/2023   Has insulin  pump- followed by Dr. Tommas, A1C at goal

## 2023-12-31 NOTE — Assessment & Plan Note (Signed)
 Followed by Nephrology- Dr. Starlin.

## 2023-12-31 NOTE — Telephone Encounter (Signed)
 Patient notified and he will recheck tomorrow.  Pt scheduled.

## 2023-12-31 NOTE — Assessment & Plan Note (Signed)
 Lab Results  Component Value Date   CHOL 130 09/10/2023   HDL 37.80 (L) 09/10/2023   LDLCALC 71 09/10/2023   TRIG 105.0 09/10/2023   CHOLHDL 3 09/10/2023   On crestor, update Lipids today- last visit he was also taking Atorvastatin  which was d/c'd.

## 2023-12-31 NOTE — Assessment & Plan Note (Signed)
 BP Readings from Last 3 Encounters:  12/31/23 (!) 125/52  11/19/23 133/81  10/11/23 134/70   Stable on current regimen, continue same.

## 2023-12-31 NOTE — Assessment & Plan Note (Signed)
 Lab Results  Component Value Date   WBC 7.6 09/10/2023   HGB 12.3 (L) 09/10/2023   HCT 37.6 (L) 09/10/2023   MCV 81.8 09/10/2023   PLT 251.0 09/10/2023   Maintained on iron supplement.

## 2023-12-31 NOTE — Assessment & Plan Note (Signed)
 No pain- not currently on gabapentin.

## 2023-12-31 NOTE — Assessment & Plan Note (Signed)
 Vit D is normal.  Not on supplement

## 2023-12-31 NOTE — Telephone Encounter (Signed)
 CRITICAL VALUE STICKER  CRITICAL VALUE: Potassium: 6.1  RECEIVER (on-site recipient of call):Keyanah Kozicki  DATE & TIME NOTIFIED: 12/31/2023 4:54 pm  MESSENGER (representative from lab):Kristine

## 2023-12-31 NOTE — Assessment & Plan Note (Addendum)
  Routine wellness visit focused on immunizations, screenings, and lifestyle. -PSA normal, [place] up to date. - Administer pneumonia vaccine due to diabetes. - Administer hepatitis B vaccine and schedule follow-up vaccine #2 in 1-2 months. - Encourage flu shot and COVID booster in September or October. - Discuss healthy diet and meal prep to reduce fast food consumption.

## 2023-12-31 NOTE — Patient Instructions (Signed)
 VISIT SUMMARY:  You came in for your annual physical exam. We discussed your diabetes management, hypertension, hyperlipidemia, and other health concerns. We also reviewed your immunizations and general health maintenance.  YOUR PLAN:  ADULT WELLNESS VISIT: Routine wellness visit focused on immunizations, screenings, and lifestyle. -Administer pneumonia vaccine due to diabetes. -Administer hepatitis B vaccine and schedule follow-up in 1-2 months. -Encourage flu shot and COVID booster in September or October. -Discuss healthy diet and meal prep to reduce fast food consumption.  TYPE 2 DIABETES MELLITUS WITH DIABETIC NEUROPATHY AND FOOT ULCER: Diabetes well-controlled with A1c at 6.4. Neuropathy with occasional foot pain. Foot ulcer managed by podiatrist. Diabetic shoes and inserts pending due to insurance. -Continue current diabetes management with endocrinologist. -Monitor foot ulcer with podiatrist every three weeks. -Explore options for diabetic shoes and inserts.  VISION IMPAIRMENT SECONDARY TO DIABETES: Vision impairment managed with regular ophthalmology follow-ups. Recent eye procedure performed. -Continue regular follow-ups with ophthalmologist and retina specialist.  CHRONIC KIDNEY DISEASE: Chronic kidney disease managed with nephrologist. -Check kidney function today. -Continue follow-up with nephrologist.  HYPERTENSION: Hypertension well-controlled with current medication regimen. -Continue current antihypertensive medications: amlodipine , hydralazine , Benicar, Aldactone, and metoprolol .  HYPERLIPIDEMIA: Hyperlipidemia managed with rosuvastatin. Previous medication duplication resolved. -Check cholesterol levels today to ensure goal is maintained with rosuvastatin alone.  CONSTIPATION: Intermittent constipation reported. -Monitor and manage as needed.

## 2023-12-31 NOTE — Telephone Encounter (Signed)
 Please request DM eye exam from Dr. Alvia.

## 2023-12-31 NOTE — Assessment & Plan Note (Signed)
>>  ASSESSMENT AND PLAN FOR PURE HYPERCHOLESTEROLEMIA WRITTEN ON 12/31/2023 10:15 AM BY DARYL SETTER, NP  Lab Results  Component Value Date   CHOL 130 09/10/2023   HDL 37.80 (L) 09/10/2023   LDLCALC 71 09/10/2023   TRIG 105.0 09/10/2023   CHOLHDL 3 09/10/2023   On crestor, update Lipids today- last visit he was also taking Atorvastatin  which was d/c'd.

## 2023-12-31 NOTE — Addendum Note (Signed)
 Addended by: ESTELLE GILLIS D on: 12/31/2023 06:12 PM   Modules accepted: Orders

## 2023-12-31 NOTE — Telephone Encounter (Signed)
 Potassium is very high.  Hold  Benicar and aldactone.  Repeat bmet tomorrow dx hyperkalemia.

## 2024-01-01 ENCOUNTER — Other Ambulatory Visit (INDEPENDENT_AMBULATORY_CARE_PROVIDER_SITE_OTHER)

## 2024-01-01 DIAGNOSIS — E875 Hyperkalemia: Secondary | ICD-10-CM

## 2024-01-01 LAB — BASIC METABOLIC PANEL WITH GFR
BUN: 42 mg/dL — ABNORMAL HIGH (ref 6–23)
CO2: 23 meq/L (ref 19–32)
Calcium: 9 mg/dL (ref 8.4–10.5)
Chloride: 107 meq/L (ref 96–112)
Creatinine, Ser: 3.64 mg/dL — ABNORMAL HIGH (ref 0.40–1.50)
GFR: 18.79 mL/min — ABNORMAL LOW (ref >60.00)
Glucose, Bld: 93 mg/dL (ref 70–99)
Potassium: 5.4 meq/L — ABNORMAL HIGH (ref 3.5–5.1)
Sodium: 137 meq/L (ref 135–145)

## 2024-01-02 ENCOUNTER — Other Ambulatory Visit: Payer: Self-pay

## 2024-01-02 ENCOUNTER — Ambulatory Visit: Payer: Self-pay | Admitting: Family

## 2024-01-02 NOTE — Telephone Encounter (Signed)
 Please advise pt that his follow up potassium is better.   Please continue to hold aldactone and benicar.  Follow up with me in 2 weeks.  Let me know if he develops weight gain of greater than 5 pounds in the meantime.

## 2024-01-02 NOTE — Telephone Encounter (Signed)
 Also, please fax copy of bmet to his nephrologist, Dr. Rayburn.

## 2024-01-08 ENCOUNTER — Encounter (INDEPENDENT_AMBULATORY_CARE_PROVIDER_SITE_OTHER): Admitting: Ophthalmology

## 2024-01-08 DIAGNOSIS — Z794 Long term (current) use of insulin: Secondary | ICD-10-CM

## 2024-01-08 DIAGNOSIS — E103513 Type 1 diabetes mellitus with proliferative diabetic retinopathy with macular edema, bilateral: Secondary | ICD-10-CM

## 2024-01-08 DIAGNOSIS — H43813 Vitreous degeneration, bilateral: Secondary | ICD-10-CM

## 2024-01-08 DIAGNOSIS — H35033 Hypertensive retinopathy, bilateral: Secondary | ICD-10-CM | POA: Diagnosis not present

## 2024-01-08 DIAGNOSIS — H2513 Age-related nuclear cataract, bilateral: Secondary | ICD-10-CM

## 2024-01-08 DIAGNOSIS — I10 Essential (primary) hypertension: Secondary | ICD-10-CM

## 2024-01-08 NOTE — Telephone Encounter (Signed)
 Electronic request made

## 2024-01-13 DIAGNOSIS — M14671 Charcot's joint, right ankle and foot: Secondary | ICD-10-CM | POA: Diagnosis not present

## 2024-01-13 DIAGNOSIS — E11621 Type 2 diabetes mellitus with foot ulcer: Secondary | ICD-10-CM | POA: Diagnosis not present

## 2024-01-15 ENCOUNTER — Encounter

## 2024-01-15 ENCOUNTER — Ambulatory Visit: Admitting: Podiatry

## 2024-01-15 ENCOUNTER — Telehealth: Payer: Self-pay

## 2024-01-15 ENCOUNTER — Encounter: Payer: Self-pay | Admitting: Podiatry

## 2024-01-15 DIAGNOSIS — E11610 Type 2 diabetes mellitus with diabetic neuropathic arthropathy: Secondary | ICD-10-CM | POA: Diagnosis not present

## 2024-01-15 NOTE — Telephone Encounter (Signed)
 LVM TO RESCHED-  (Resched reason Provider out of office.) Original appt was made for an orthotic eval. Pt needs to see one of our doctors first before he gets measured for orthotics because he hasn't been seen since 2023.

## 2024-01-16 NOTE — Progress Notes (Signed)
 Subjective:   Patient ID: Javier SHAUNNA Veatrice Mickey., male   DOB: 48 y.o.   MRN: [id number]   HPI Patient presents wearing a Omelia walker and stating that he has been wearing this for several years and is hopeful that he will be able to get some kind of a diabetic shoe with cushioned insoles that might allow him to get out of this with patient having significant history of ulceration right that finally healed with Omelia boot   ROS      Objective:  Physical Exam  Neurovascular status is moderately reduced but unchanged from previous with the patient found to have healed ulceration plantar lateral aspect right foot with moderate protuberance of bone in this area that had broken down with probable Charcot deformity     Assessment:  Patient who has been wearing a heavy Crow boot for the last several years and has had history of ulceration     Plan:  H&P reviewed and I do think that we need to try to get him into something to relieve some pressure and hopefully get him out of the boot that he wears and the current chronic debridement that occurs.  At this point I am going to refer him to pedorthist for evaluation and the hope that we can get him some kind of offloading.  All of this was discussed with him an appointment to be made with carley

## 2024-01-17 ENCOUNTER — Telehealth: Payer: Self-pay | Admitting: Family

## 2024-01-17 ENCOUNTER — Ambulatory Visit (INDEPENDENT_AMBULATORY_CARE_PROVIDER_SITE_OTHER): Admitting: Family

## 2024-01-17 ENCOUNTER — Ambulatory Visit: Payer: Self-pay | Admitting: Family

## 2024-01-17 VITALS — BP 130/61 | HR 69 | Temp 98.1°F | Resp 16 | Ht 66.0 in | Wt 216.0 lb

## 2024-01-17 DIAGNOSIS — N184 Chronic kidney disease, stage 4 (severe): Secondary | ICD-10-CM | POA: Diagnosis not present

## 2024-01-17 DIAGNOSIS — E875 Hyperkalemia: Secondary | ICD-10-CM | POA: Diagnosis not present

## 2024-01-17 LAB — BASIC METABOLIC PANEL WITH GFR
BUN: 29 mg/dL — ABNORMAL HIGH (ref 6–23)
CO2: 28 meq/L (ref 19–32)
Calcium: 8.6 mg/dL (ref 8.4–10.5)
Chloride: 103 meq/L (ref 96–112)
Creatinine, Ser: 2.66 mg/dL — ABNORMAL HIGH (ref 0.40–1.50)
GFR: 27.36 mL/min — ABNORMAL LOW (ref >60.00)
Glucose, Bld: 108 mg/dL — ABNORMAL HIGH (ref 70–99)
Potassium: 4.6 meq/L (ref 3.5–5.1)
Sodium: 137 meq/L (ref 135–145)

## 2024-01-17 NOTE — Telephone Encounter (Signed)
 Please request DM eye exam from Dr. Alvia.

## 2024-01-17 NOTE — Progress Notes (Signed)
 Subjective:     Patient ID: Javier SHAUNNA Veatrice Mickey., male    DOB: 1976-02-09, 48 y.o.   MRN: [id number]  Chief Complaint  Patient presents with   Hyperkalemia    Here for follow up   Hypertension    DC benicar due to high potassium     Hypertension    Discussed the use of AI scribe software for clinical note transcription with the patient, who gave verbal consent to proceed.  History of Present Illness  Javier Kornegay. is a 48 year old male with DM 1 and CKD stage 4 who presents for follow-up on high potassium levels.  Per our recommendations he continues to hold Benicar and Aldactone.  Follow up K+ dropped from 6.1 to 5.4.   He has not experienced increased swelling or shortness of breath since stopping these medications. BP remains stable.  He is managing diabetic retinopathy with his retinal specialist, Dr. Alvia with monthly visits and annual eye exams with his general opthalmologist.   He is having difficulty obtaining diabetic shoe inserts as [facility] and [facility] do not provide them. He is considering [facility] for orthotic solutions.  He is scheduled for blood work for his nephrologist next week and has a follow-up appointment in September.     Health Maintenance Due  Topic Date Due   OPHTHALMOLOGY EXAM  09/07/2022   DTaP/Tdap/Td (2 - Td or Tdap) 01/08/2024   INFLUENZA VACCINE  01/03/2024    Past Medical History:  Diagnosis Date   Acute osteomyelitis of right calcaneus (HCC)    s/p debiidement and wound vac 08-28-2019   Allergy    Anemia of chronic renal failure, stage 3a (HCC) 09/10/2019   sees dr jolee ronco 09-23-2019 on chart   Blood transfusion without reported diagnosis    Charcot's joint of right ankle    Chronic heel ulcer, right, limited to breakdown of skin (HCC)    since march 2021, has wound vac since 10-27-2019 changed vac 10-12-2019   Clotting disorder (HCC)    DM type 1 (diabetes mellitus, type 1) (HCC)    Erythropoietin   deficiency anemia 09/10/2019   GERD (gastroesophageal reflux disease)    Hyperkalemia    Hypertension    Iron deficiency anemia due to chronic blood loss 09/10/2019   Kidney disease    Neuropathy    feet and toes   Sleep apnea    cpap    Ulcer     Past Surgical History:  Procedure Laterality Date   ANKLE SURGERY Right    APPENDECTOMY  yrs ago   EYE SURGERY     GRAFT APPLICATION Right 10/28/2019   Procedure: GRAFT APPLICATION;  Surgeon: Gretel Ozell PARAS, DPM;  Location: [facility] [place];  Service: Podiatry;  Laterality: Right;   I & D EXTREMITY Right 10/16/2019   Procedure: IRRIGATION AND DEBRIDEMENT OF RIGHT HEEL WITH APPLICATION WOUND VAC;  Surgeon: Gretel Ozell PARAS, DPM;  Location: [facility] [place];  Service: Podiatry;  Laterality: Right;   INCISION AND DRAINAGE OF WOUND Right 10/28/2019   Procedure: IRRIGATION AND DEBRIDEMENT WOUND RIGHT HEEL;  Surgeon: Gretel Ozell PARAS, DPM;  Location: [facility] ;  Service: Podiatry;  Laterality: Right;   IRRIGATION AND DEBRIDEMENT FOOT Right 08/28/2019   Procedure: RIght foot wound incision and drainage, debridement and irrigation, bone biopsy, application of wound VAC;  Surgeon: Gretel Ozell PARAS, DPM;  Location: [facility];  Service: Podiatry;  Laterality: Right;    Family  History  Problem Relation Age of Onset   Heart failure Mother    Stroke Mother    Kidney failure Mother    Diabetes Mother    Kidney disease Mother    Cancer Father        non hodgkin's lymphoma   Heart failure Father    Heart disease Father    Cancer Brother 15       unsure what type   Cancer Brother     Social History   Socioeconomic History   Marital status: Married    Spouse name: Secondary school teacher   Number of children: 1   Years of education: 12   Highest education level: Some college, no degree  Occupational History   Not on file  Tobacco Use   Smoking status: Never   Smokeless tobacco: Never  Vaping Use   Vaping status:  Never Used  Substance and Sexual Activity   Alcohol use: No   Drug use: Never   Sexual activity: Not Currently  Other Topics Concern   Not on file  Social History Narrative   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   Married   One son 2009   Completed some college   No pets   Enjoys watching his son's sports   Social Drivers of Corporate investment banker Strain: Medium Risk (12/30/2023)   Overall Financial Resource Strain (CARDIA)    Difficulty of Paying Living Expenses: Somewhat hard  Food Insecurity: No Food Insecurity (12/30/2023)   Hunger Vital Sign    Worried About Running Out of Food in the Last Year: Never true    Ran Out of Food in the Last Year: Never true  Transportation Needs: No Transportation Needs (12/30/2023)   PRAPARE - Administrator, Civil Service (Medical): No    Lack of Transportation (Non-Medical): No  Physical Activity: Inactive (12/30/2023)   Exercise Vital Sign    Days of Exercise per Week: 0 days    Minutes of Exercise per Session: Not on file  Stress: Stress Concern Present (12/30/2023)   [employer] of Occupational Health - Occupational Stress Questionnaire    Feeling of Stress: Rather much  Social Connections: Moderately Integrated (12/30/2023)   Social Connection and Isolation Panel    Frequency of Communication with Friends and Family: Twice a week    Frequency of Social Gatherings with Friends and Family: Once a week    Attends Religious Services: More than 4 times per year    Active Member of [employer] or Organizations: No    Attends Banker Meetings: Not on file    Marital Status: Married  Catering manager Violence: Not At Risk (10/29/2023)   Humiliation, Afraid, Rape, and Kick questionnaire    Fear of Current or Ex-Partner: No    Emotionally Abused: No    Physically Abused: No    Sexually Abused: No    Outpatient Medications Prior to Visit  Medication Sig Dispense Refill   alfuzosin   (UROXATRAL ) 10 MG 24 hr tablet Take 1 tablet (10 mg total) by mouth daily. 30 tablet 11   amLODipine  (NORVASC ) 5 MG tablet Take 1 tablet (5 mg total) by mouth daily. 90 tablet 1   aspirin  81 MG tablet Take 81 mg by mouth daily.     ezetimibe  (ZETIA ) 10 MG tablet Take 10 mg by mouth daily.     ferrous sulfate  325 (65 FE) MG tablet Take 1 tablet (325 mg total) by mouth  2 (two) times daily with a meal.  3   hydrALAZINE  (APRESOLINE ) 50 MG tablet Take 100 mg by mouth 3 (three) times daily.     Insulin  Human (INSULIN  PUMP) SOLN Inject 1 each into the skin daily. Humalog. Base: 36 units per day.  1 unit per 10 grams of carbohydrates.  Basal rate varies usually 0.95     metoprolol  succinate (TOPROL -XL) 100 MG 24 hr tablet Take 100 mg by mouth daily.     omeprazole (PRILOSEC) 40 MG capsule Take 40 mg by mouth daily.  3   rosuvastatin (CRESTOR) 40 MG tablet Take 40 mg by mouth daily.     tadalafil  (CIALIS ) 20 MG tablet Take 1 tablet (20 mg total) by mouth daily as needed. 10 tablet 11   olmesartan (BENICAR) 5 MG tablet Take 5 mg by mouth daily.     spironolactone (ALDACTONE) 25 MG tablet Take 25 mg by mouth daily.     No facility-administered medications prior to visit.    Allergies  Allergen Reactions   Lisinopril Cough   Keflex [Cephalexin] Rash    ROS    See HPI Objective:    Physical Exam Constitutional:      General: He is not in acute distress.    Appearance: He is well-developed.  HENT:     Head: Normocephalic and atraumatic.  Cardiovascular:     Rate and Rhythm: Normal rate and regular rhythm.     Heart sounds: No murmur heard. Pulmonary:     Effort: Pulmonary effort is normal. No respiratory distress.     Breath sounds: Normal breath sounds. No wheezing or rales.  Skin:    General: Skin is warm and dry.  Neurological:     Mental Status: He is alert and oriented to person, place, and time.  Psychiatric:        Behavior: Behavior normal.        Thought Content: Thought  content normal.      BP 130/61 (BP Location: Right Arm, Patient Position: Sitting, Cuff Size: Normal)   Pulse 69   Temp 98.1 F (36.7 C) (Oral)   Resp 16   Ht 5' 6 (1.676 m)   Wt 216 lb (98 kg)   SpO2 100%   BMI 34.86 kg/m  Wt Readings from Last 3 Encounters:  01/17/24 216 lb (98 kg)  12/31/23 214 lb (97.1 kg)  11/19/23 215 lb (97.5 kg)       Assessment & Plan:   Problem List Items Addressed This Visit       Unprioritized   CKD (chronic kidney disease) stage 4, GFR 15-29 ml/min (HCC) (Chronic)   Management per nephrology.       Hyperkalemia - Primary   Will repeat K+, formally d/c aldactone and benicar. Pt is advised to keep follow up as scheduled with his nephrologist, Dr. Alica in September.      Relevant Orders   Basic Metabolic Panel (BMET)    I have discontinued Javier SQUIBB. Nihiser Jr.'s spironolactone and olmesartan. I am also having him maintain his aspirin , omeprazole, ezetimibe , ferrous sulfate , metoprolol  succinate, insulin  pump, rosuvastatin, hydrALAZINE , amLODipine , alfuzosin , and tadalafil .  No orders of the defined types were placed in this encounter.

## 2024-01-17 NOTE — Assessment & Plan Note (Signed)
 Management per nephrology.

## 2024-01-17 NOTE — Patient Instructions (Signed)
 VISIT SUMMARY:  You came in for a follow-up on your high potassium levels. Your potassium levels have improved but are still slightly elevated after stopping Benicar and Aldactone. We also discussed your ongoing management of diabetic retinopathy and the difficulty in obtaining diabetic shoe inserts.  YOUR PLAN:  HYPERKALEMIA: Your potassium levels are slightly elevated but have improved since stopping Benicar and Aldactone. -Repeat potassium level today. -Please remain off of Benicar and Aldactone.   CHRONIC KIDNEY DISEASE: We are coordinating with your nephrologist regarding your kidney health. -Inform your nephrologist that you have stopped taking Benicar and Aldactone due to high potassium levels. -Follow up with your nephrologist as scheduled in September.

## 2024-01-17 NOTE — Assessment & Plan Note (Signed)
 Will repeat K+, formally d/c aldactone and benicar. Pt is advised to keep follow up as scheduled with his nephrologist, Dr. Alica in September.

## 2024-01-20 NOTE — Telephone Encounter (Signed)
 Electronic request sent 01/08/24

## 2024-01-21 DIAGNOSIS — N184 Chronic kidney disease, stage 4 (severe): Secondary | ICD-10-CM | POA: Diagnosis not present

## 2024-01-23 ENCOUNTER — Ambulatory Visit: Admitting: Urology

## 2024-01-23 ENCOUNTER — Encounter: Payer: Self-pay | Admitting: Urology

## 2024-01-23 VITALS — BP 149/83 | HR 67 | Ht 66.0 in | Wt 215.0 lb

## 2024-01-23 DIAGNOSIS — N138 Other obstructive and reflux uropathy: Secondary | ICD-10-CM | POA: Diagnosis not present

## 2024-01-23 DIAGNOSIS — N529 Male erectile dysfunction, unspecified: Secondary | ICD-10-CM | POA: Diagnosis not present

## 2024-01-23 DIAGNOSIS — N401 Enlarged prostate with lower urinary tract symptoms: Secondary | ICD-10-CM

## 2024-01-23 LAB — MICROSCOPIC EXAMINATION: Casts: POSITIVE /LPF — AB

## 2024-01-23 LAB — URINALYSIS, ROUTINE W REFLEX MICROSCOPIC
Bilirubin, UA: NEGATIVE
Glucose, UA: NEGATIVE
Ketones, UA: NEGATIVE
Leukocytes,UA: NEGATIVE
Nitrite, UA: NEGATIVE
RBC, UA: NEGATIVE
Specific Gravity, UA: 1.025 (ref 1.005–1.030)
Urobilinogen, Ur: 0.2 mg/dL (ref 0.2–1.0)
pH, UA: 6 (ref 5.0–7.5)

## 2024-01-23 LAB — BLADDER SCAN AMB NON-IMAGING

## 2024-01-23 NOTE — Progress Notes (Signed)
 Assessment: 1. BPH with obstruction/lower urinary tract symptoms   2. Organic impotence     Plan: Continue alfuzosin  10 mg daily. I discussed further evaluation of his lower urinary tract symptoms with cystoscopy. He does not wish to pursue any further evaluation at this time. Continue trial of tadalafil  20 mg prn. Return to office in 3 months.   Chief Complaint:  Chief Complaint  Patient presents with   Benign Prostatic Hypertrophy    History of Present Illness:  Javier Hunt. is a 48 y.o. male who is seen for further evaluation of BPH with lower urinary tract symptoms.   At his initial visit in June 2025, he reported lower urinary tract symptoms for 3-4 years. His urinary symptoms included decreased stream, bladder pressure, occasional hesitancy, and sensation of incomplete emptying.  No daytime frequency or urgency.  He had nocturia x 1.  No dysuria or gross hematuria.  No history of UTIs.  He was started on tamsulosin  0.4 mg daily in April 2025 but did not notice a significant change in his symptoms with the medication. IPSS = 17/3. PVR = 12 ml.  PSA 4/25: 1.60  He also reported erectile dysfunction for 4 years.  He was able to achieve a partial erection which was not adequate for intercourse.  He had approximately 50% rigidity and difficulty maintaining his erection as well.  He reported nocturnal erections.  He previously took a medication which did help with his erections initially.  He had not tried anything for several years.  No decrease in his libido. Risk factors for ED include diabetes, hypertension, hypercholesterolemia, and beta-blocker use.  He has chronic kidney disease and is followed by Dr. Rayburn.  Creatinine from 4/25 was 3.38.  He was given a trial of alfuzosin  10 mg daily for his lower urinary tract symptoms. He was given a prescription for tadalafil  20 mg as needed for erectile dysfunction.  He returns today for follow-up.  He continues on  alfuzosin .  He has not seen a significant change in his lower urinary tract symptoms.  He continues to report a weak stream and hesitancy.  He has nocturia x 1.  No sensation of incomplete emptying.  No dysuria or gross hematuria. IPSS = 11/3. He has not tried tadalafil  20 mg as needed for his erectile dysfunction.  Portions of the above documentation were copied from a prior visit for review purposes only.   Past Medical History:  Past Medical History:  Diagnosis Date   Acute osteomyelitis of right calcaneus (HCC)    s/p debiidement and wound vac 08-28-2019   Allergy    Anemia of chronic renal failure, stage 3a (HCC) 09/10/2019   sees dr jolee ronco 09-23-2019 on chart   Blood transfusion without reported diagnosis    Charcot's joint of right ankle    Chronic heel ulcer, right, limited to breakdown of skin (HCC)    since march 2021, has wound vac since 10-27-2019 changed vac 10-12-2019   Clotting disorder (HCC)    DM type 1 (diabetes mellitus, type 1) (HCC)    Erythropoietin  deficiency anemia 09/10/2019   GERD (gastroesophageal reflux disease)    Hyperkalemia    Hypertension    Iron deficiency anemia due to chronic blood loss 09/10/2019   Kidney disease    Neuropathy    feet and toes   Sleep apnea    cpap    Ulcer     Past Surgical History:  Past Surgical History:  Procedure Laterality Date  ANKLE SURGERY Right    APPENDECTOMY  yrs ago   EYE SURGERY     GRAFT APPLICATION Right 10/28/2019   Procedure: GRAFT APPLICATION;  Surgeon: Gretel Ozell PARAS, DPM;  Location: [facility] [place];  Service: Podiatry;  Laterality: Right;   I & D EXTREMITY Right 10/16/2019   Procedure: IRRIGATION AND DEBRIDEMENT OF RIGHT HEEL WITH APPLICATION WOUND VAC;  Surgeon: Gretel Ozell PARAS, DPM;  Location: [facility];  Service: Podiatry;  Laterality: Right;   INCISION AND DRAINAGE OF WOUND Right 10/28/2019   Procedure: IRRIGATION AND DEBRIDEMENT WOUND RIGHT HEEL;  Surgeon:  Gretel Ozell PARAS, DPM;  Location: [facility] ;  Service: Podiatry;  Laterality: Right;   IRRIGATION AND DEBRIDEMENT FOOT Right 08/28/2019   Procedure: RIght foot wound incision and drainage, debridement and irrigation, bone biopsy, application of wound VAC;  Surgeon: Gretel Ozell PARAS, DPM;  Location: [facility];  Service: Podiatry;  Laterality: Right;    Allergies:  Allergies  Allergen Reactions   Lisinopril Cough   Keflex [Cephalexin] Rash    Family History:  Family History  Problem Relation Age of Onset   Heart failure Mother    Stroke Mother    Kidney failure Mother    Diabetes Mother    Kidney disease Mother    Cancer Father        non hodgkin's lymphoma   Heart failure Father    Heart disease Father    Cancer Brother 80       unsure what type   Cancer Brother     Social History:  Social History   Tobacco Use   Smoking status: Never   Smokeless tobacco: Never  Vaping Use   Vaping status: Never Used  Substance Use Topics   Alcohol use: No   Drug use: Never    ROS: Constitutional:  Negative for fever, chills, weight loss CV: Negative for chest pain, previous MI, hypertension Respiratory:  Negative for shortness of breath, wheezing, sleep apnea, frequent cough GI:  Negative for nausea, vomiting, bloody stool, GERD  Physical exam: BP (!) 149/83   Pulse 67   Ht 5' 6 (1.676 m)   Wt 215 lb (97.5 kg)   BMI 34.70 kg/m  GENERAL APPEARANCE:  Well appearing, well developed, well nourished, NAD HEENT:  Atraumatic, normocephalic, oropharynx clear NECK:  Supple without lymphadenopathy or thyromegaly ABDOMEN:  Soft, non-tender, no masses EXTREMITIES:  Moves all extremities well, without clubbing, cyanosis, or edema NEUROLOGIC:  Alert and oriented x 3, normal gait, CN II-XII grossly intact MENTAL STATUS:  appropriate BACK:  Non-tender to palpation, No CVAT SKIN:  Warm, dry, and intact   Results: U/A: 0-5 WBC, 0-2 RBC  PVR = 19 ml

## 2024-01-27 DIAGNOSIS — Z794 Long term (current) use of insulin: Secondary | ICD-10-CM | POA: Diagnosis not present

## 2024-01-27 DIAGNOSIS — E109 Type 1 diabetes mellitus without complications: Secondary | ICD-10-CM | POA: Diagnosis not present

## 2024-01-27 DIAGNOSIS — E1065 Type 1 diabetes mellitus with hyperglycemia: Secondary | ICD-10-CM | POA: Diagnosis not present

## 2024-01-31 ENCOUNTER — Ambulatory Visit

## 2024-01-31 DIAGNOSIS — E10621 Type 1 diabetes mellitus with foot ulcer: Secondary | ICD-10-CM

## 2024-01-31 DIAGNOSIS — M2141 Flat foot [pes planus] (acquired), right foot: Secondary | ICD-10-CM

## 2024-01-31 DIAGNOSIS — E1022 Type 1 diabetes mellitus with diabetic chronic kidney disease: Secondary | ICD-10-CM

## 2024-01-31 DIAGNOSIS — E11610 Type 2 diabetes mellitus with diabetic neuropathic arthropathy: Secondary | ICD-10-CM

## 2024-01-31 NOTE — Progress Notes (Signed)
 Patient presents to the office today for diabetic shoe and insole measuring.   Patient's diabetic provider: Dr Tommas PPWK given to patient to have signed off by DTD Patient was casted today for custom shoes A5501 2pr if A5513 Casts to be mailed when ppw recevd     [employer] / Active shoe chosen in [place]  Offload to right wound and Charcot area to help reduce pressure and promote healing

## 2024-02-04 DIAGNOSIS — N179 Acute kidney failure, unspecified: Secondary | ICD-10-CM | POA: Diagnosis not present

## 2024-02-04 DIAGNOSIS — E785 Hyperlipidemia, unspecified: Secondary | ICD-10-CM | POA: Diagnosis not present

## 2024-02-04 DIAGNOSIS — G4733 Obstructive sleep apnea (adult) (pediatric): Secondary | ICD-10-CM | POA: Diagnosis not present

## 2024-02-04 DIAGNOSIS — N184 Chronic kidney disease, stage 4 (severe): Secondary | ICD-10-CM | POA: Diagnosis not present

## 2024-02-04 DIAGNOSIS — R809 Proteinuria, unspecified: Secondary | ICD-10-CM | POA: Diagnosis not present

## 2024-02-04 DIAGNOSIS — E1122 Type 2 diabetes mellitus with diabetic chronic kidney disease: Secondary | ICD-10-CM | POA: Diagnosis not present

## 2024-02-04 DIAGNOSIS — E875 Hyperkalemia: Secondary | ICD-10-CM | POA: Diagnosis not present

## 2024-02-04 DIAGNOSIS — I129 Hypertensive chronic kidney disease with stage 1 through stage 4 chronic kidney disease, or unspecified chronic kidney disease: Secondary | ICD-10-CM | POA: Diagnosis not present

## 2024-02-04 DIAGNOSIS — M7989 Other specified soft tissue disorders: Secondary | ICD-10-CM | POA: Diagnosis not present

## 2024-02-11 ENCOUNTER — Ambulatory Visit (INDEPENDENT_AMBULATORY_CARE_PROVIDER_SITE_OTHER)

## 2024-02-11 ENCOUNTER — Ambulatory Visit: Admitting: Family

## 2024-02-11 DIAGNOSIS — Z23 Encounter for immunization: Secondary | ICD-10-CM

## 2024-02-11 NOTE — Progress Notes (Signed)
 Patient presents today for 2 nd Hep B vaccine per original order from Melissa O'Sullivan,NP: Administer hepatitis B vaccine and schedule follow-up vaccine #2 in 1-2 months.   Patient received vaccine in left deltoid and tolerated vaccine well.

## 2024-02-12 ENCOUNTER — Encounter (INDEPENDENT_AMBULATORY_CARE_PROVIDER_SITE_OTHER): Admitting: Ophthalmology

## 2024-02-12 DIAGNOSIS — I10 Essential (primary) hypertension: Secondary | ICD-10-CM

## 2024-02-12 DIAGNOSIS — E103591 Type 1 diabetes mellitus with proliferative diabetic retinopathy without macular edema, right eye: Secondary | ICD-10-CM

## 2024-02-12 DIAGNOSIS — E103512 Type 1 diabetes mellitus with proliferative diabetic retinopathy with macular edema, left eye: Secondary | ICD-10-CM | POA: Diagnosis not present

## 2024-02-12 DIAGNOSIS — H43813 Vitreous degeneration, bilateral: Secondary | ICD-10-CM | POA: Diagnosis not present

## 2024-02-12 DIAGNOSIS — H35371 Puckering of macula, right eye: Secondary | ICD-10-CM | POA: Diagnosis not present

## 2024-02-12 DIAGNOSIS — Z794 Long term (current) use of insulin: Secondary | ICD-10-CM | POA: Diagnosis not present

## 2024-02-12 DIAGNOSIS — H35033 Hypertensive retinopathy, bilateral: Secondary | ICD-10-CM | POA: Diagnosis not present

## 2024-02-13 DIAGNOSIS — E611 Iron deficiency: Secondary | ICD-10-CM | POA: Diagnosis not present

## 2024-02-13 DIAGNOSIS — M14671 Charcot's joint, right ankle and foot: Secondary | ICD-10-CM | POA: Diagnosis not present

## 2024-02-13 DIAGNOSIS — E1140 Type 2 diabetes mellitus with diabetic neuropathy, unspecified: Secondary | ICD-10-CM | POA: Diagnosis not present

## 2024-02-13 DIAGNOSIS — Z23 Encounter for immunization: Secondary | ICD-10-CM | POA: Diagnosis not present

## 2024-02-13 DIAGNOSIS — I10 Essential (primary) hypertension: Secondary | ICD-10-CM | POA: Diagnosis not present

## 2024-02-13 DIAGNOSIS — E11621 Type 2 diabetes mellitus with foot ulcer: Secondary | ICD-10-CM | POA: Diagnosis not present

## 2024-02-13 DIAGNOSIS — E1121 Type 2 diabetes mellitus with diabetic nephropathy: Secondary | ICD-10-CM | POA: Diagnosis not present

## 2024-02-13 DIAGNOSIS — E1065 Type 1 diabetes mellitus with hyperglycemia: Secondary | ICD-10-CM | POA: Diagnosis not present

## 2024-02-13 DIAGNOSIS — E559 Vitamin D deficiency, unspecified: Secondary | ICD-10-CM | POA: Diagnosis not present

## 2024-02-13 DIAGNOSIS — N189 Chronic kidney disease, unspecified: Secondary | ICD-10-CM | POA: Diagnosis not present

## 2024-02-13 DIAGNOSIS — Z9641 Presence of insulin pump (external) (internal): Secondary | ICD-10-CM | POA: Diagnosis not present

## 2024-02-18 DIAGNOSIS — N184 Chronic kidney disease, stage 4 (severe): Secondary | ICD-10-CM | POA: Diagnosis not present

## 2024-02-18 DIAGNOSIS — I129 Hypertensive chronic kidney disease with stage 1 through stage 4 chronic kidney disease, or unspecified chronic kidney disease: Secondary | ICD-10-CM | POA: Diagnosis not present

## 2024-02-20 NOTE — Progress Notes (Signed)
 Ppwk recvd from [facility] need to send to [employer] for prior auth

## 2024-02-21 ENCOUNTER — Telehealth: Payer: Self-pay

## 2024-02-21 NOTE — Telephone Encounter (Signed)
 Called patient to inform signed ppwk from treating doctor is here

## 2024-02-24 NOTE — Telephone Encounter (Signed)
 Prior authorization rcvd. No auth required  Re: [[id number]] [[id number]]  Indexed in media

## 2024-02-28 NOTE — Telephone Encounter (Signed)
 Casts sent to [employer] tracking number [id number] USPS   Lolita Schultze Cped, CFo, CFm

## 2024-03-12 ENCOUNTER — Other Ambulatory Visit: Payer: Self-pay

## 2024-03-12 DIAGNOSIS — N138 Other obstructive and reflux uropathy: Secondary | ICD-10-CM

## 2024-03-12 MED ORDER — ALFUZOSIN HCL ER 10 MG PO TB24: 10.0000 mg | ORAL_TABLET | Freq: Every day | ORAL | 11 refills | Status: AC

## 2024-03-13 DIAGNOSIS — E11621 Type 2 diabetes mellitus with foot ulcer: Secondary | ICD-10-CM | POA: Diagnosis not present

## 2024-03-13 DIAGNOSIS — M14671 Charcot's joint, right ankle and foot: Secondary | ICD-10-CM | POA: Diagnosis not present

## 2024-03-16 ENCOUNTER — Encounter (INDEPENDENT_AMBULATORY_CARE_PROVIDER_SITE_OTHER): Admitting: Ophthalmology

## 2024-03-16 DIAGNOSIS — E103591 Type 1 diabetes mellitus with proliferative diabetic retinopathy without macular edema, right eye: Secondary | ICD-10-CM | POA: Diagnosis not present

## 2024-03-16 DIAGNOSIS — I10 Essential (primary) hypertension: Secondary | ICD-10-CM | POA: Diagnosis not present

## 2024-03-16 DIAGNOSIS — H35033 Hypertensive retinopathy, bilateral: Secondary | ICD-10-CM | POA: Diagnosis not present

## 2024-03-16 DIAGNOSIS — E103512 Type 1 diabetes mellitus with proliferative diabetic retinopathy with macular edema, left eye: Secondary | ICD-10-CM | POA: Diagnosis not present

## 2024-03-16 DIAGNOSIS — H43813 Vitreous degeneration, bilateral: Secondary | ICD-10-CM

## 2024-03-16 DIAGNOSIS — Z794 Long term (current) use of insulin: Secondary | ICD-10-CM

## 2024-03-16 DIAGNOSIS — E1065 Type 1 diabetes mellitus with hyperglycemia: Secondary | ICD-10-CM | POA: Diagnosis not present

## 2024-03-16 DIAGNOSIS — E109 Type 1 diabetes mellitus without complications: Secondary | ICD-10-CM | POA: Diagnosis not present

## 2024-03-16 DIAGNOSIS — H2513 Age-related nuclear cataract, bilateral: Secondary | ICD-10-CM | POA: Diagnosis not present

## 2024-04-15 DIAGNOSIS — E13621 Other specified diabetes mellitus with foot ulcer: Secondary | ICD-10-CM | POA: Diagnosis not present

## 2024-04-15 DIAGNOSIS — M14671 Charcot's joint, right ankle and foot: Secondary | ICD-10-CM | POA: Diagnosis not present

## 2024-04-16 DIAGNOSIS — E1065 Type 1 diabetes mellitus with hyperglycemia: Secondary | ICD-10-CM | POA: Diagnosis not present

## 2024-04-16 DIAGNOSIS — E109 Type 1 diabetes mellitus without complications: Secondary | ICD-10-CM | POA: Diagnosis not present

## 2024-04-16 DIAGNOSIS — Z794 Long term (current) use of insulin: Secondary | ICD-10-CM | POA: Diagnosis not present

## 2024-04-20 ENCOUNTER — Encounter (INDEPENDENT_AMBULATORY_CARE_PROVIDER_SITE_OTHER): Admitting: Ophthalmology

## 2024-04-20 DIAGNOSIS — H2513 Age-related nuclear cataract, bilateral: Secondary | ICD-10-CM | POA: Diagnosis not present

## 2024-04-20 DIAGNOSIS — H43813 Vitreous degeneration, bilateral: Secondary | ICD-10-CM | POA: Diagnosis not present

## 2024-04-20 DIAGNOSIS — E103591 Type 1 diabetes mellitus with proliferative diabetic retinopathy without macular edema, right eye: Secondary | ICD-10-CM

## 2024-04-20 DIAGNOSIS — I10 Essential (primary) hypertension: Secondary | ICD-10-CM | POA: Diagnosis not present

## 2024-04-20 DIAGNOSIS — Z794 Long term (current) use of insulin: Secondary | ICD-10-CM

## 2024-04-20 DIAGNOSIS — E103512 Type 1 diabetes mellitus with proliferative diabetic retinopathy with macular edema, left eye: Secondary | ICD-10-CM

## 2024-04-20 DIAGNOSIS — H35033 Hypertensive retinopathy, bilateral: Secondary | ICD-10-CM | POA: Diagnosis not present

## 2024-04-20 DIAGNOSIS — H35371 Puckering of macula, right eye: Secondary | ICD-10-CM

## 2024-04-21 ENCOUNTER — Ambulatory Visit (INDEPENDENT_AMBULATORY_CARE_PROVIDER_SITE_OTHER): Admitting: Family

## 2024-04-21 VITALS — BP 144/64 | HR 66 | Temp 97.7°F | Resp 16 | Ht 66.0 in | Wt 224.0 lb

## 2024-04-21 DIAGNOSIS — E1022 Type 1 diabetes mellitus with diabetic chronic kidney disease: Secondary | ICD-10-CM

## 2024-04-21 DIAGNOSIS — I129 Hypertensive chronic kidney disease with stage 1 through stage 4 chronic kidney disease, or unspecified chronic kidney disease: Secondary | ICD-10-CM

## 2024-04-21 DIAGNOSIS — D509 Iron deficiency anemia, unspecified: Secondary | ICD-10-CM | POA: Diagnosis not present

## 2024-04-21 DIAGNOSIS — F418 Other specified anxiety disorders: Secondary | ICD-10-CM

## 2024-04-21 DIAGNOSIS — E785 Hyperlipidemia, unspecified: Secondary | ICD-10-CM

## 2024-04-21 DIAGNOSIS — L97413 Non-pressure chronic ulcer of right heel and midfoot with necrosis of muscle: Secondary | ICD-10-CM

## 2024-04-21 DIAGNOSIS — K219 Gastro-esophageal reflux disease without esophagitis: Secondary | ICD-10-CM | POA: Diagnosis not present

## 2024-04-21 DIAGNOSIS — N184 Chronic kidney disease, stage 4 (severe): Secondary | ICD-10-CM | POA: Diagnosis not present

## 2024-04-21 DIAGNOSIS — F322 Major depressive disorder, single episode, severe without psychotic features: Secondary | ICD-10-CM | POA: Insufficient documentation

## 2024-04-21 DIAGNOSIS — I10 Essential (primary) hypertension: Secondary | ICD-10-CM

## 2024-04-21 DIAGNOSIS — E559 Vitamin D deficiency, unspecified: Secondary | ICD-10-CM | POA: Diagnosis not present

## 2024-04-21 DIAGNOSIS — G4733 Obstructive sleep apnea (adult) (pediatric): Secondary | ICD-10-CM

## 2024-04-21 DIAGNOSIS — E11621 Type 2 diabetes mellitus with foot ulcer: Secondary | ICD-10-CM

## 2024-04-21 LAB — CBC WITH DIFFERENTIAL/PLATELET
Basophils Absolute: 0.1 K/uL (ref 0.0–0.1)
Basophils Relative: 0.8 % (ref 0.0–3.0)
Eosinophils Absolute: 0.4 K/uL (ref 0.0–0.7)
Eosinophils Relative: 5.6 % — ABNORMAL HIGH (ref 0.0–5.0)
HCT: 36.6 % — ABNORMAL LOW (ref 39.0–52.0)
Hemoglobin: 11.8 g/dL — ABNORMAL LOW (ref 13.0–17.0)
Lymphocytes Relative: 17.5 % (ref 12.0–46.0)
Lymphs Abs: 1.3 K/uL (ref 0.7–4.0)
MCHC: 32.3 g/dL (ref 30.0–36.0)
MCV: 80.8 fl (ref 78.0–100.0)
Monocytes Absolute: 0.7 K/uL (ref 0.1–1.0)
Monocytes Relative: 9.9 % (ref 3.0–12.0)
Neutro Abs: 4.9 K/uL (ref 1.4–7.7)
Neutrophils Relative %: 66.2 % (ref 43.0–77.0)
Platelets: 251 K/uL (ref 150.0–400.0)
RBC: 4.53 Mil/uL (ref 4.22–5.81)
RDW: 13.3 % (ref 11.5–15.5)
WBC: 7.4 K/uL (ref 4.0–10.5)

## 2024-04-21 LAB — COMPREHENSIVE METABOLIC PANEL WITH GFR
ALT: 38 U/L (ref 0–53)
AST: 34 U/L (ref 0–37)
Albumin: 3.7 g/dL (ref 3.5–5.2)
Alkaline Phosphatase: 87 U/L (ref 39–117)
BUN: 42 mg/dL — ABNORMAL HIGH (ref 6–23)
CO2: 25 meq/L (ref 19–32)
Calcium: 9.1 mg/dL (ref 8.4–10.5)
Chloride: 104 meq/L (ref 96–112)
Creatinine, Ser: 3.33 mg/dL — ABNORMAL HIGH (ref 0.40–1.50)
GFR: 20.86 mL/min — ABNORMAL LOW (ref >60.00)
Glucose, Bld: 108 mg/dL — ABNORMAL HIGH (ref 70–99)
Potassium: 4.5 meq/L (ref 3.5–5.1)
Sodium: 136 meq/L (ref 135–145)
Total Bilirubin: 0.6 mg/dL (ref 0.2–1.2)
Total Protein: 6.9 g/dL (ref 6.0–8.3)

## 2024-04-21 LAB — HEMOGLOBIN A1C: Hgb A1c MFr Bld: 6.1 % (ref 4.6–6.5)

## 2024-04-21 MED ORDER — FERROUS SULFATE 325 (65 FE) MG PO TABS: 325.0000 mg | ORAL_TABLET | Freq: Every day | ORAL | Status: AC

## 2024-04-21 MED ORDER — OLMESARTAN MEDOXOMIL 5 MG PO TABS: 10.0000 mg | ORAL_TABLET | Freq: Every day | ORAL | 1 refills | Status: AC

## 2024-04-21 NOTE — Assessment & Plan Note (Signed)
 Normal without supplement.

## 2024-04-21 NOTE — Patient Instructions (Signed)
 VISIT SUMMARY: During your visit, we discussed your increased anxiety and lack of motivation, which you attribute to being unemployed and spending more time at home. We also reviewed your current medications, blood pressure, diabetes management, sleep apnea, and other health concerns. You received advice on managing your conditions and were provided with referrals and follow-up plans.  YOUR PLAN: -TYPE 2 DIABETES MELLITUS WITH RIGHT FOOT ULCER AND DIABETIC CHRONIC KIDNEY DISEASE: Type 2 diabetes is a condition where your body does not use insulin  properly, leading to high blood sugar levels. You have a reopened ulcer on your right foot and chronic kidney disease related to diabetes. We increased your olmesartan dose to 10 mg daily to help manage your blood pressure and kidney function. Please recheck your blood pressure and kidney function in 1-2 weeks with the nurse. We also ordered lab work for your blood count and A1c. Continue your current diabetes management and follow up with your nephrologist in December.  -HYPERTENSION: Hypertension is high blood pressure, which can lead to serious health problems if not managed. Your blood pressure was slightly elevated, so we increased your olmesartan dose to 10 mg daily. Please recheck your blood pressure in 1-2 weeks with the nurse.  -OBSTRUCTIVE SLEEP APNEA: Obstructive sleep apnea is a condition where your breathing stops and starts during sleep. You need new CPAP supplies and have not seen a sleep specialist since your diagnosis. We referred you to [facility] for sleep apnea management and to update your CPAP supplies.  -DEPRESSION AND ANXIETY SYMPTOMS: Depression and anxiety are mental health conditions that can affect your mood and motivation. You reported increased anxiety and lack of motivation, possibly due to unemployment. You prefer counseling over medication, so we provided you with a pamphlet on counseling services and encouraged you  to contact them for support.  -HYPERLIPIDEMIA: Hyperlipidemia is having high levels of fats (lipids) in your blood, which can increase your risk of heart disease. Your cholesterol levels are good as of the last check, so continue your current therapy with Crestor and Zetia .  -IRON DEFICIENCY ANEMIA: Iron deficiency anemia is a condition where you do not have enough iron, leading to a lower number of red blood cells. You have mild anemia and are taking an iron supplement once daily. Continue this supplementation and use a stool softener if you experience constipation.  -GASTROESOPHAGEAL REFLUX DISEASE: Gastroesophageal reflux disease (GERD) is a condition where stomach acid frequently flows back into the tube connecting your mouth and stomach. Your reflux is controlled with omeprazole, except when eating certain foods. Continue taking omeprazole 40 mg daily.  -GENERAL HEALTH MAINTENANCE: You are due for a tetanus shot, which is not covered by Medicare. You have received your flu shot and COVID booster. Check the availability and c ost of the tetanus shot at your pharmacy. Continue with routine health maintenance, including flu and COVID vaccinations.  INSTRUCTIONS: Please recheck your blood pressure and kidney function in 1-2 weeks with the nurse. Follow up with your nephrologist in December. Contact counseling services for support with anxiety and depression. Check the availability and cost of the tetanus shot at your pharmacy. Follow up with [facility] for sleep apnea management and CPAP supplies update.                      Contains text generated by [employer]bridge.  Contains text generated by [employer]bridge.

## 2024-04-21 NOTE — Assessment & Plan Note (Signed)
 BP Readings from Last 3 Encounters:  04/21/24 (!) 144/64  01/23/24 (!) 149/83  01/17/24 130/61   Uncontrolled. Continue amlodipine , metoprolol , olmesartan 5 mg. Will increase olmesartan to 10 mg.

## 2024-04-21 NOTE — Assessment & Plan Note (Signed)
 This is being followed closely by [facility].

## 2024-04-21 NOTE — Assessment & Plan Note (Signed)
 Reports symptoms are stable. He is maintained on omeprazole 40 mg.

## 2024-04-21 NOTE — Assessment & Plan Note (Signed)
 Lab Results  Component Value Date   HGBA1C 6.4 09/10/2023   HGBA1C 6.9 (H) 08/12/2019   Lab Results  Component Value Date   MICROALBUR 159.1 (H) 09/10/2023   LDLCALC 71 09/10/2023   CREATININE 2.66 (H) 01/17/2024   Reports last A1C 6.6- following with Dr. Tommas- endocrinology.

## 2024-04-21 NOTE — Assessment & Plan Note (Signed)
 Lab Results  Component Value Date   CHOL 130 09/10/2023   HDL 37.80 (L) 09/10/2023   LDLCALC 71 09/10/2023   TRIG 105.0 09/10/2023   CHOLHDL 3 09/10/2023  Stable on crestor 40mg  as well as zetia .

## 2024-04-21 NOTE — Assessment & Plan Note (Signed)
 Declines medication- would like to try counseling- will give pamphlet on counseling services/scheduling.

## 2024-04-21 NOTE — Assessment & Plan Note (Signed)
 Lab Results  Component Value Date   WBC 7.6 09/10/2023   HGB 12.3 (L) 09/10/2023   HCT 37.6 (L) 09/10/2023   MCV 81.8 09/10/2023   PLT 251.0 09/10/2023

## 2024-04-21 NOTE — Progress Notes (Signed)
 Subjective:     Patient ID: Javier Hunt., male    DOB: 15-Oct-1975, 48 y.o.   MRN: [id number]  Chief Complaint  Patient presents with   Hypertension    Here for follow up    Depression    Patient reports little motivation at this time   Anxiety    Patient reports  increased symptoms    Hypertension Associated symptoms include anxiety.  Depression        Past medical history includes anxiety.   Anxiety      Discussed the use of AI scribe software for clinical note transcription with the patient, who gave verbal consent to proceed.  History of Present Illness Habeeb Puertas. is a 48 year old male who presents with increased anxiety and lack of motivation.  He has been experiencing increased anxiety and lack of motivation, which he attributes to being unemployed and spending more time at home. He has a history of using Paxil during college when his mother was diagnosed with a brain tumor but is not currently on any medication for anxiety or depression.  He mentions stress eating and a sudden weight gain. He attempted to start exercising by walking on a treadmill but was advised against high-impact activities due to an ulcer on his right foot. The ulcer has reopened. 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  He is currently taking Toprol  XL 100 mg daily, amlodipine  5 mg daily, and olmesartan  10 mg daily for blood pressure management. He believes he has been on olmesartan  for a while but may have overlooked it on his medication list. His blood pressure was noted to be slightly elevated in August.  He has a history of sleep apnea and uses a CPAP machine. He has not seen a doctor for this condition since receiving the machine and is unsure about the need for follow-up or new supplies.  His blood sugars have been stable, and he is scheduled to see his nephrologist in December. He has mild anemia and is taking  an iron supplement once daily, which may cause constipation. His last A1c was 6.6, and he is taking Crestor and Zetia  for cholesterol management.  He experiences reflux controlled by omeprazole 40 mg, with occasional exacerbations when eating certain foods like 'raising canes'. He is unsure who prescribes this medication.  He has received his flu shot and a COVID booster, likely Moderna, about a month and a half ago.      Health Maintenance Due  Topic Date Due   DTaP/Tdap/Td (2 - Td or Tdap) 01/08/2024    Past Medical History:  Diagnosis Date   Acute osteomyelitis of right calcaneus (HCC)    s/p debiidement and wound vac 08-28-2019   Allergy    Anemia of chronic renal failure, stage 3a (HCC) 09/10/2019   sees dr jolee ronco 09-23-2019 on chart   Blood transfusion without reported diagnosis    Charcot's joint of right ankle    Chronic heel ulcer, right, limited to breakdown of skin (HCC)    since march 2021, has wound vac since 10-27-2019 changed vac 10-12-2019   Clotting disorder    DM type 1 (diabetes mellitus, type 1) (HCC)    Erythropoietin  deficiency anemia 09/10/2019   GERD (gastroesophageal reflux disease)    Hyperkalemia    Hypertension    Iron deficiency anemia due to chronic blood loss 09/10/2019   Kidney disease  Neuropathy    feet and toes   Sleep apnea    cpap    Ulcer     Past Surgical History:  Procedure Laterality Date   ANKLE SURGERY Right    APPENDECTOMY  yrs ago   EYE SURGERY     GRAFT APPLICATION Right 10/28/2019   Procedure: GRAFT APPLICATION;  Surgeon: Gretel Ozell PARAS, DPM;  Location: [facility];  Service: Podiatry;  Laterality: Right;   I & D EXTREMITY Right 10/16/2019   Procedure: IRRIGATION AND DEBRIDEMENT OF RIGHT HEEL WITH APPLICATION WOUND VAC;  Surgeon: Gretel Ozell PARAS, DPM;  Location: [facility] [place];  Service: Podiatry;  Laterality: Right;   INCISION AND DRAINAGE OF WOUND Right 10/28/2019   Procedure:  IRRIGATION AND DEBRIDEMENT WOUND RIGHT HEEL;  Surgeon: Gretel Ozell PARAS, DPM;  Location: [facility] [place];  Service: Podiatry;  Laterality: Right;   IRRIGATION AND DEBRIDEMENT FOOT Right 08/28/2019   Procedure: RIght foot wound incision and drainage, debridement and irrigation, bone biopsy, application of wound VAC;  Surgeon: Gretel Ozell PARAS, DPM;  Location: [facility];  Service: Podiatry;  Laterality: Right;    Family History  Problem Relation Age of Onset   Heart failure Mother    Stroke Mother    Kidney failure Mother    Diabetes Mother    Kidney disease Mother    Cancer Father        non hodgkin's lymphoma   Heart failure Father    Heart disease Father    Cancer Brother 57       unsure what type   Cancer Brother     Social History   Socioeconomic History   Marital status: Married    Spouse name: Secondary School Teacher   Number of children: 1   Years of education: 12   Highest education level: Some college, no degree  Occupational History   Not on file  Tobacco Use   Smoking status: Never   Smokeless tobacco: Never  Vaping Use   Vaping status: Never Used  Substance and Sexual Activity   Alcohol use: No   Drug use: Never   Sexual activity: Not Currently  Other Topics Concern   Not on file  Social History Narrative   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   Married   One son 2009   Completed some college   No pets   Enjoys watching his son's sports   Social Drivers of Corporate Investment Banker Strain: Medium Risk (04/14/2024)   Overall Financial Resource Strain (CARDIA)    Difficulty of Paying Living Expenses: Somewhat hard  Food Insecurity: No Food Insecurity (04/14/2024)   Hunger Vital Sign    Worried About Running Out of Food in the Last Year: Never true    Ran Out of Food in the Last Year: Never true  Transportation Needs: No Transportation Needs (04/14/2024)   PRAPARE - Administrator, Civil Service (Medical): No    Lack of  Transportation (Non-Medical): No  Physical Activity: Inactive (04/14/2024)   Exercise Vital Sign    Days of Exercise per Week: 0 days    Minutes of Exercise per Session: Not on file  Stress: Stress Concern Present (04/14/2024)   [employer] of Occupational Health - Occupational Stress Questionnaire    Feeling of Stress: To some extent  Social Connections: Moderately Integrated (04/14/2024)   Social Connection and Isolation Panel    Frequency of Communication with Friends and  Family: Twice a week    Frequency of Social Gatherings with Friends and Family: Once a week    Attends Religious Services: More than 4 times per year    Active Member of [employer] or Organizations: No    Attends Engineer, Structural: Not on file    Marital Status: Married  Catering Manager Violence: Not At Risk (10/29/2023)   Humiliation, Afraid, Rape, and Kick questionnaire    Fear of Current or Ex-Partner: No    Emotionally Abused: No    Physically Abused: No    Sexually Abused: No    Outpatient Medications Prior to Visit  Medication Sig Dispense Refill   alfuzosin  (UROXATRAL ) 10 MG 24 hr tablet Take 1 tablet (10 mg total) by mouth daily. 30 tablet 11   amLODipine  (NORVASC ) 5 MG tablet Take 1 tablet (5 mg total) by mouth daily. 90 tablet 1   aspirin  81 MG tablet Take 81 mg by mouth daily.     ezetimibe  (ZETIA ) 10 MG tablet Take 10 mg by mouth daily.     hydrALAZINE  (APRESOLINE ) 50 MG tablet Take 100 mg by mouth 3 (three) times daily.     Insulin  Human (INSULIN  PUMP) SOLN Inject 1 each into the skin daily. Humalog. Base: 36 units per day.  1 unit per 10 grams of carbohydrates.  Basal rate varies usually 0.95     metoprolol  succinate (TOPROL -XL) 100 MG 24 hr tablet Take 100 mg by mouth daily.     omeprazole (PRILOSEC) 40 MG capsule Take 40 mg by mouth daily.  3   rosuvastatin (CRESTOR) 40 MG tablet Take 40 mg by mouth daily.     tadalafil  (CIALIS ) 20 MG tablet Take 1 tablet (20 mg total) by mouth  daily as needed. 10 tablet 11   ferrous sulfate  325 (65 FE) MG tablet Take 1 tablet (325 mg total) by mouth 2 (two) times daily with a meal.  3   olmesartan (BENICAR) 5 MG tablet Take 5 mg by mouth daily.     No facility-administered medications prior to visit.    Allergies  Allergen Reactions   Lisinopril Cough   Keflex [Cephalexin] Rash    Review of Systems  Psychiatric/Behavioral:  Positive for depression.        Objective:    Physical Exam Constitutional:      General: He is not in acute distress.    Appearance: He is well-developed.  HENT:     Head: Normocephalic and atraumatic.  Cardiovascular:     Rate and Rhythm: Normal rate and regular rhythm.     Heart sounds: No murmur heard. Pulmonary:     Effort: Pulmonary effort is normal. No respiratory distress.     Breath sounds: Normal breath sounds. No wheezing or rales.  Skin:    General: Skin is warm and dry.  Neurological:     Mental Status: He is alert and oriented to person, place, and time.  Psychiatric:        Behavior: Behavior normal.        Thought Content: Thought content normal.      BP (!) 144/64 (BP Location: Right Arm, Patient Position: Sitting, Cuff Size: Large)   Pulse 66   Temp 97.7 F (36.5 C) (Oral)   Resp 16   Ht 5' 6 (1.676 m)   Wt 224 lb (101.6 kg)   SpO2 99%   BMI 36.15 kg/m  Wt Readings from Last 3 Encounters:  04/21/24 224 lb (101.6 kg)  01/23/24  215 lb (97.5 kg)  01/17/24 216 lb (98 kg)       Assessment & Plan:   Problem List Items Addressed This Visit       Unprioritized   CKD (chronic kidney disease) stage 4, GFR 15-29 ml/min (HCC) (Chronic)   Management per nephrology.       Vitamin D  deficiency   Normal without supplement.       Type 1 diabetes mellitus with stage 4 chronic kidney disease and hypertension (HCC)   Lab Results  Component Value Date   HGBA1C 6.4 09/10/2023   HGBA1C 6.9 (H) 08/12/2019   Lab Results  Component Value Date   MICROALBUR 159.1  (H) 09/10/2023   LDLCALC 71 09/10/2023   CREATININE 2.66 (H) 01/17/2024   Reports last A1C 6.6- following with Dr. Tommas- endocrinology.       Relevant Medications   olmesartan (BENICAR) 5 MG tablet   Other Relevant Orders   HgB A1c (Completed)   Comp Met (CMET) (Completed)   Primary hypertension   BP Readings from Last 3 Encounters:  04/21/24 (!) 144/64  01/23/24 (!) 149/83  01/17/24 130/61   Uncontrolled. Continue amlodipine , metoprolol , olmesartan 5 mg. Will increase olmesartan to 10 mg.       Relevant Medications   olmesartan (BENICAR) 5 MG tablet   Moderately severe major depression (HCC)   He declines medication at this time. Will refer for counseling.      Iron deficiency anemia   Lab Results  Component Value Date   WBC 7.6 09/10/2023   HGB 12.3 (L) 09/10/2023   HCT 37.6 (L) 09/10/2023   MCV 81.8 09/10/2023   PLT 251.0 09/10/2023         Relevant Medications   ferrous sulfate  325 (65 FE) MG tablet   Other Relevant Orders   CBC w/Diff (Completed)   Iron, TIBC and Ferritin Panel   Hyperlipidemia   Lab Results  Component Value Date   CHOL 130 09/10/2023   HDL 37.80 (L) 09/10/2023   LDLCALC 71 09/10/2023   TRIG 105.0 09/10/2023   CHOLHDL 3 09/10/2023  Stable on crestor 40mg  as well as zetia .        Relevant Medications   olmesartan (BENICAR) 5 MG tablet   Gastroesophageal reflux disease   Reports symptoms are stable. He is maintained on omeprazole 40 mg.        Diabetic ulcer of right heel associated with type 2 diabetes mellitus, with necrosis of muscle (HCC)   This is being followed closely by [facility].       Relevant Medications   olmesartan (BENICAR) 5 MG tablet   Depression with anxiety - Primary   Declines medication- would like to try counseling- will give pamphlet on counseling services/scheduling.       Other Visit Diagnoses       OSA (obstructive sleep apnea)       Relevant Orders   Ambulatory referral to Pulmonology        I have changed Javier SQUIBB. Karstens Jr.'s olmesartan and ferrous sulfate . I am also having him maintain his aspirin , omeprazole, ezetimibe , metoprolol  succinate, insulin  pump, rosuvastatin, hydrALAZINE , amLODipine , tadalafil , and alfuzosin .  Meds ordered this encounter  Medications   olmesartan (BENICAR) 5 MG tablet    Sig: Take 2 tablets (10 mg total) by mouth daily.    Dispense:  60 tablet    Refill:  1    Supervising Provider:   DOMENICA BLACKBIRD A [[id number]]   ferrous sulfate  325 (  65 FE) MG tablet    Sig: Take 1 tablet (325 mg total) by mouth daily with breakfast.    Supervising Provider:   DOMENICA BLACKBIRD A [[id number]]

## 2024-04-21 NOTE — Assessment & Plan Note (Signed)
 He declines medication at this time. Will refer for counseling.

## 2024-04-21 NOTE — Assessment & Plan Note (Signed)
 Management per nephrology.

## 2024-04-22 LAB — IRON,TIBC AND FERRITIN PANEL
%SAT: 29 % (ref 20–48)
Ferritin: 385 ng/mL — ABNORMAL HIGH (ref 38–380)
Iron: 71 ug/dL (ref 50–180)
TIBC: 243 ug/dL — ABNORMAL LOW (ref 250–425)

## 2024-04-23 ENCOUNTER — Ambulatory Visit: Admitting: Urology

## 2024-04-23 ENCOUNTER — Ambulatory Visit: Payer: Self-pay | Admitting: Family

## 2024-04-27 DIAGNOSIS — N184 Chronic kidney disease, stage 4 (severe): Secondary | ICD-10-CM | POA: Diagnosis not present

## 2024-05-05 ENCOUNTER — Telehealth: Payer: Self-pay

## 2024-05-05 NOTE — Telephone Encounter (Signed)
 Patient called in about his DB shoes. I looked up the tracking on them with the number you provided and it says they sent it back to us  on 03/09/2024.

## 2024-05-06 DIAGNOSIS — E13621 Other specified diabetes mellitus with foot ulcer: Secondary | ICD-10-CM | POA: Diagnosis not present

## 2024-05-06 DIAGNOSIS — M14671 Charcot's joint, right ankle and foot: Secondary | ICD-10-CM | POA: Diagnosis not present

## 2024-05-07 DIAGNOSIS — E7800 Pure hypercholesterolemia, unspecified: Secondary | ICD-10-CM | POA: Diagnosis not present

## 2024-05-12 ENCOUNTER — Ambulatory Visit

## 2024-05-12 ENCOUNTER — Telehealth: Payer: Self-pay

## 2024-05-12 NOTE — Telephone Encounter (Signed)
 Called [facility] to see if I could get a compliance report for his CPAP, as it appears that he had used [facility] previously for other medical supplies. [employer] does not have this pt as a customer.   ATC LVMTCB with patient, asking him to bring CPAP with him to his appointment on 05/13/24. Also sending MyChart message to pt.

## 2024-05-13 ENCOUNTER — Encounter: Payer: Self-pay | Admitting: Pulmonary Disease

## 2024-05-13 ENCOUNTER — Ambulatory Visit: Admitting: Pulmonary Disease

## 2024-05-13 VITALS — BP 128/72 | HR 71 | Temp 97.7°F | Ht 66.0 in | Wt 220.0 lb

## 2024-05-13 DIAGNOSIS — G4733 Obstructive sleep apnea (adult) (pediatric): Secondary | ICD-10-CM | POA: Insufficient documentation

## 2024-05-13 NOTE — Progress Notes (Signed)
 Synopsis: Referred in 04/2024 for OSA by Daryl Setter, NP  Subjective:   PATIENT ID: Javier Hunt. GENDER: male DOB: [birth year], MRN: [id number]  Chief Complaint  Patient presents with   Consult    Dx with OSA around 10 years ago. In lab test done in [place]. Previously saw [facility] medical with Dr Rilla.     HPI  Javier Hunt [employer]. Is a 48 y.o. male with T1DM c/b CKD Stage 4, OSA (on CPAP) who presents today to establish care.  Patient reports that his wife has told him he is snoring through his CPAP. Using nasal mask (Wisp). Adherent to CPAP therapy. Using an old [employer] System One CPAP device. Looked up device on [employer] -- this was part of the recall. Patient was not aware of this recall. Patient denies noticing any black flecks or debris from his device but has been using a SoClean / ozone based cleaning device with his CPAP.  Sleep study was ~10 years ago at another facility and not available for me to review. Patient now on a [facility] plan.  Sleep habits: - Snoring while on CPAP: YES - Bedtime: midnight - 1am - Sleep latency: 20-30 mins. - Number of awakenings per night: 1x/night - Wake time: 6-7am - Waking headaches: No - EDS symptoms: Yes (this is new and he thinks developed after 12-15 lbs weight gain) - Sleep aids: None - Stimulant use: None - H/o MVAs / drowsy driving: No. - Bruxism: Yes. Only intermittently uses a dental guard.  Comorbidities: - Hypertension: Yes - Diabetes: Yes (T1DM) - Obesity: Yes  Other: - GLP-1 use: No  Limited data available from his CPAP device but there is sufficient data to determine he is adherent to therapy.  CPAP Data (obtained directly from device today): - Device: Respironics/Philips System One device - Pressure: Fixed pressure of 7 cm H2O. - Residual AHI: 10.7/hr for past 30 days. - No large leak. - Negligible periodic breathing (< 2%)   Pulm Questionnaires:      No data to  display           Past Medical History:  Diagnosis Date   Acute osteomyelitis of right calcaneus (HCC)    s/p debiidement and wound vac 08-28-2019   Allergy    Anemia of chronic renal failure, stage 3a (HCC) 09/10/2019   sees dr jolee ronco 09-23-2019 on chart   Blood transfusion without reported diagnosis    Charcot's joint of right ankle    Chronic heel ulcer, right, limited to breakdown of skin (HCC)    since march 2021, has wound vac since 10-27-2019 changed vac 10-12-2019   Clotting disorder    DM type 1 (diabetes mellitus, type 1) (HCC)    Erythropoietin  deficiency anemia 09/10/2019   GERD (gastroesophageal reflux disease)    Hyperkalemia    Hypertension    Iron deficiency anemia due to chronic blood loss 09/10/2019   Kidney disease    Neuropathy    feet and toes   Sleep apnea    cpap    Ulcer      Family History  Problem Relation Age of Onset   Heart failure Mother    Stroke Mother    Kidney failure Mother    Diabetes Mother    Kidney disease Mother    Cancer Father        non hodgkin's lymphoma   Heart failure Father    Heart disease Father    Cancer Brother  38       unsure what type   Cancer Brother      Past Surgical History:  Procedure Laterality Date   ANKLE SURGERY Right    APPENDECTOMY  yrs ago   EYE SURGERY     GRAFT APPLICATION Right 10/28/2019   Procedure: GRAFT APPLICATION;  Surgeon: Gretel Ozell PARAS, DPM;  Location: [facility] [place];  Service: Podiatry;  Laterality: Right;   I & D EXTREMITY Right 10/16/2019   Procedure: IRRIGATION AND DEBRIDEMENT OF RIGHT HEEL WITH APPLICATION WOUND VAC;  Surgeon: Gretel Ozell PARAS, DPM;  Location: [facility] [place];  Service: Podiatry;  Laterality: Right;   INCISION AND DRAINAGE OF WOUND Right 10/28/2019   Procedure: IRRIGATION AND DEBRIDEMENT WOUND RIGHT HEEL;  Surgeon: Gretel Ozell PARAS, DPM;  Location: [facility];  Service: Podiatry;  Laterality: Right;   IRRIGATION AND  DEBRIDEMENT FOOT Right 08/28/2019   Procedure: RIght foot wound incision and drainage, debridement and irrigation, bone biopsy, application of wound VAC;  Surgeon: Gretel Ozell PARAS, DPM;  Location: [facility];  Service: Podiatry;  Laterality: Right;    Social History   Socioeconomic History   Marital status: Married    Spouse name: Secondary School Teacher   Number of children: 1   Years of education: 12   Highest education level: Some college, no degree  Occupational History   Not on file  Tobacco Use   Smoking status: Never   Smokeless tobacco: Never  Vaping Use   Vaping status: Never Used  Substance and Sexual Activity   Alcohol use: No   Drug use: Never   Sexual activity: Not Currently  Other Topics Concern   Not on file  Social History Narrative   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   Married   One son 2009   Completed some college   No pets   Enjoys watching his son's sports   Social Drivers of Corporate Investment Banker Strain: Medium Risk (04/14/2024)   Overall Financial Resource Strain (CARDIA)    Difficulty of Paying Living Expenses: Somewhat hard  Food Insecurity: No Food Insecurity (04/14/2024)   Hunger Vital Sign    Worried About Running Out of Food in the Last Year: Never true    Ran Out of Food in the Last Year: Never true  Transportation Needs: No Transportation Needs (04/14/2024)   PRAPARE - Administrator, Civil Service (Medical): No    Lack of Transportation (Non-Medical): No  Physical Activity: Inactive (04/14/2024)   Exercise Vital Sign    Days of Exercise per Week: 0 days    Minutes of Exercise per Session: Not on file  Stress: Stress Concern Present (04/14/2024)   [employer] of Occupational Health - Occupational Stress Questionnaire    Feeling of Stress: To some extent  Social Connections: Moderately Integrated (04/14/2024)   Social Connection and Isolation Panel    Frequency of Communication with Friends and Family:  Twice a week    Frequency of Social Gatherings with Friends and Family: Once a week    Attends Religious Services: More than 4 times per year    Active Member of [employer] or Organizations: No    Attends Banker Meetings: Not on file    Marital Status: Married  Intimate Partner Violence: Not At Risk (10/29/2023)   Humiliation, Afraid, Rape, and Kick questionnaire    Fear of Current or Ex-Partner: No    Emotionally Abused: No  Physically Abused: No    Sexually Abused: No     Allergies  Allergen Reactions   Lisinopril Cough   Keflex [Cephalexin] Rash     Outpatient Medications Prior to Visit  Medication Sig Dispense Refill   alfuzosin  (UROXATRAL ) 10 MG 24 hr tablet Take 1 tablet (10 mg total) by mouth daily. 30 tablet 11   amLODipine  (NORVASC ) 5 MG tablet Take 1 tablet (5 mg total) by mouth daily. 90 tablet 1   aspirin  81 MG tablet Take 81 mg by mouth daily.     ezetimibe  (ZETIA ) 10 MG tablet Take 10 mg by mouth daily.     ferrous sulfate  325 (65 FE) MG tablet Take 1 tablet (325 mg total) by mouth daily with breakfast.     hydrALAZINE  (APRESOLINE ) 50 MG tablet Take 100 mg by mouth 3 (three) times daily.     Insulin  Human (INSULIN  PUMP) SOLN Inject 1 each into the skin daily. Humalog. Base: 36 units per day.  1 unit per 10 grams of carbohydrates.  Basal rate varies usually 0.95     metoprolol  succinate (TOPROL -XL) 100 MG 24 hr tablet Take 100 mg by mouth daily.     olmesartan  (BENICAR ) 5 MG tablet Take 2 tablets (10 mg total) by mouth daily. 60 tablet 1   omeprazole (PRILOSEC) 40 MG capsule Take 40 mg by mouth daily.  3   rosuvastatin (CRESTOR) 40 MG tablet Take 40 mg by mouth daily.     tadalafil  (CIALIS ) 20 MG tablet Take 1 tablet (20 mg total) by mouth daily as needed. 10 tablet 11   No facility-administered medications prior to visit.    ROS   Objective:  Physical Exam Vitals reviewed.  Constitutional:      Appearance: Normal appearance. He is obese.  HENT:      Head: Normocephalic and atraumatic.     Mouth/Throat:     Mouth: Mucous membranes are moist.     Pharynx: Oropharynx is clear. No oropharyngeal exudate or posterior oropharyngeal erythema.     Comments: Mallampati II. Prominent tongue scalloping. Eyes:     General: No scleral icterus.       Right eye: No discharge.        Left eye: No discharge.     Conjunctiva/sclera: Conjunctivae normal.  Pulmonary:     Effort: Pulmonary effort is normal.  Musculoskeletal:     Comments: Uses wheeled assistive device to ambulate.  Neurological:     Mental Status: He is alert and oriented to person, place, and time. Mental status is at baseline.  Psychiatric:        Behavior: Behavior normal.        Thought Content: Thought content normal.        Judgment: Judgment normal.      Vitals:   05/13/24 0930  BP: 128/72  Pulse: 71  Temp: 97.7 F (36.5 C)  SpO2: 96%  Weight: 220 lb (99.8 kg)  Height: 5' 6 (1.676 m)   96% on RA BMI Readings from Last 3 Encounters:  05/13/24 35.51 kg/m  04/21/24 36.15 kg/m  01/23/24 34.70 kg/m   Wt Readings from Last 3 Encounters:  05/13/24 220 lb (99.8 kg)  04/21/24 224 lb (101.6 kg)  01/23/24 215 lb (97.5 kg)     CBC    Component Value Date/Time   WBC 7.4 04/21/2024 1212   RBC 4.53 04/21/2024 1212   HGB 11.8 (L) 04/21/2024 1212   HGB 11.5 (L) 10/15/2019 0826   HCT  36.6 (L) 04/21/2024 1212   PLT 251.0 04/21/2024 1212   PLT 363 10/15/2019 0826   MCV 80.8 04/21/2024 1212   MCH 26.4 10/16/2020 0430   MCHC 32.3 04/21/2024 1212   RDW 13.3 04/21/2024 1212   LYMPHSABS 1.3 04/21/2024 1212   MONOABS 0.7 04/21/2024 1212   EOSABS 0.4 04/21/2024 1212   BASOSABS 0.1 04/21/2024 1212    Chest Imaging: NA  Pulmonary Functions Testing Results:     No data to display          FeNO: NA  Pathology: NA  Echocardiogram: 10/15/2020: Normal LVEF 55-60%. Mild LVH. Normal diastolic parameters.  Heart Catheterization: NA  Sleep  Studies: Not on file.    Assessment & Plan:     ICD-10-CM   1. OSA (obstructive sleep apnea)  G47.33 Home sleep test      Discussion:  # OSA (unknown severity): - Given concomitant use of SoClean with his recalled device, which was noted by [employer] to have increased the likelihood of sound-abatement foam breakdown in their recalled devices, I have advised the patient to stop using his device at this time. - Patient was given the phone number to the [employer] Device Recall hotline to call and enquire about his options. - I will attempt to expedite a HST (since he is now on [facility] and will need to meet CMS criteria and we do not have a copy of his old sleep study). - Following that HST, I hope to expedite getting him a new CPAP device. He needs a higher pressure than CPAP 7 which showed today a residual AHI ranging from 7.7-12.5 /hr. Recommend AutoCPAP 8-15 cm H2O for his new device. - Mask: Wisp mask. - DME: None at present, but patient currently uses [facility] for his diabetic supplies so if they can do CPAP supplies as well he would prefer to use them.  # Bruxism: - Encouraged patient to wear dental guard every night to help mitigate risk of damage to his teeth.  Follow up: Will be scheduled when patient is called by our office with the results of his HST.   Current Outpatient Medications:    alfuzosin  (UROXATRAL ) 10 MG 24 hr tablet, Take 1 tablet (10 mg total) by mouth daily., Disp: 30 tablet, Rfl: 11   amLODipine  (NORVASC ) 5 MG tablet, Take 1 tablet (5 mg total) by mouth daily., Disp: 90 tablet, Rfl: 1   aspirin  81 MG tablet, Take 81 mg by mouth daily., Disp: , Rfl:    ezetimibe  (ZETIA ) 10 MG tablet, Take 10 mg by mouth daily., Disp: , Rfl:    ferrous sulfate  325 (65 FE) MG tablet, Take 1 tablet (325 mg total) by mouth daily with breakfast., Disp: , Rfl:    hydrALAZINE  (APRESOLINE ) 50 MG tablet, Take 100 mg by mouth 3 (three) times daily., Disp: , Rfl:    Insulin  Human (INSULIN   PUMP) SOLN, Inject 1 each into the skin daily. Humalog. Base: 36 units per day.  1 unit per 10 grams of carbohydrates.  Basal rate varies usually 0.95, Disp: , Rfl:    metoprolol  succinate (TOPROL -XL) 100 MG 24 hr tablet, Take 100 mg by mouth daily., Disp: , Rfl:    olmesartan  (BENICAR ) 5 MG tablet, Take 2 tablets (10 mg total) by mouth daily., Disp: 60 tablet, Rfl: 1   omeprazole (PRILOSEC) 40 MG capsule, Take 40 mg by mouth daily., Disp: , Rfl: 3   rosuvastatin (CRESTOR) 40 MG tablet, Take 40 mg by mouth daily., Disp: ,  Rfl:    tadalafil  (CIALIS ) 20 MG tablet, Take 1 tablet (20 mg total) by mouth daily as needed., Disp: 10 tablet, Rfl: 11  Time spent on day of this encounter (includes time spent face-to-face with the patient as well as time spent the same day as the encounter reviewing existing data and notes, and/or documenting my findings and the plan of care): 45 minutes   Lamar Dales, MD Pulmonary, Critical Care & Sleep Medicine [facility] 05/13/24 10:20 AM

## 2024-05-13 NOTE — Patient Instructions (Signed)
 Please call [employer] regarding the recall of your CPAP device to learn more about your options: [phone]  STOP using your CPAP device as it was affected by the recall.  ----------  You will receive a call to schedule your Sleep Study.  ----------   There is no need to schedule a follow up visit today. Our office will call you with the results and next steps, and you will schedule your next visit at that time.  ----------

## 2024-05-25 ENCOUNTER — Encounter (INDEPENDENT_AMBULATORY_CARE_PROVIDER_SITE_OTHER): Admitting: Ophthalmology

## 2024-05-25 DIAGNOSIS — H43813 Vitreous degeneration, bilateral: Secondary | ICD-10-CM | POA: Diagnosis not present

## 2024-05-25 DIAGNOSIS — E103591 Type 1 diabetes mellitus with proliferative diabetic retinopathy without macular edema, right eye: Secondary | ICD-10-CM

## 2024-05-25 DIAGNOSIS — H35371 Puckering of macula, right eye: Secondary | ICD-10-CM

## 2024-05-25 DIAGNOSIS — I10 Essential (primary) hypertension: Secondary | ICD-10-CM | POA: Diagnosis not present

## 2024-05-25 DIAGNOSIS — Z794 Long term (current) use of insulin: Secondary | ICD-10-CM

## 2024-05-25 DIAGNOSIS — H35033 Hypertensive retinopathy, bilateral: Secondary | ICD-10-CM | POA: Diagnosis not present

## 2024-05-25 DIAGNOSIS — E103512 Type 1 diabetes mellitus with proliferative diabetic retinopathy with macular edema, left eye: Secondary | ICD-10-CM | POA: Diagnosis not present

## 2024-05-27 ENCOUNTER — Encounter

## 2024-05-27 DIAGNOSIS — G4733 Obstructive sleep apnea (adult) (pediatric): Secondary | ICD-10-CM

## 2024-06-03 ENCOUNTER — Other Ambulatory Visit: Payer: Self-pay | Admitting: Pulmonary Disease

## 2024-06-03 DIAGNOSIS — G4731 Primary central sleep apnea: Secondary | ICD-10-CM

## 2024-06-03 DIAGNOSIS — R063 Periodic breathing: Secondary | ICD-10-CM

## 2024-06-03 NOTE — Progress Notes (Signed)
 Spoke to patient about results of his HST which showed central sleep apnea.  Recommended and ordered in-lab PAP Titration (CPAP initially, with switch to ASV if needed)  Also advised patient he will need an updated echocardiogram performed to ensure LVEF is preserved (last TTE showed preserved LVEF but was from 2022). Order placed.  Patient voiced understanding of this.  Lamar JINNY Dales, MD

## 2024-06-09 ENCOUNTER — Ambulatory Visit (HOSPITAL_COMMUNITY)
Admission: RE | Admit: 2024-06-09 | Discharge: 2024-06-09 | Disposition: A | Discharge status: Home / Self Care | Source: Ambulatory Visit | Attending: Cardiology | Admitting: Cardiology

## 2024-06-09 DIAGNOSIS — G4731 Primary central sleep apnea: Secondary | ICD-10-CM | POA: Insufficient documentation

## 2024-06-09 DIAGNOSIS — R063 Periodic breathing: Secondary | ICD-10-CM | POA: Insufficient documentation

## 2024-06-09 DIAGNOSIS — I10 Essential (primary) hypertension: Secondary | ICD-10-CM

## 2024-06-09 LAB — ECHOCARDIOGRAM COMPLETE
Area-P 1/2: 5.2 cm2
S' Lateral: 3.6 cm

## 2024-06-14 ENCOUNTER — Ambulatory Visit: Payer: Self-pay | Admitting: Pulmonary Disease

## 2024-06-15 ENCOUNTER — Telehealth: Payer: Self-pay

## 2024-06-15 NOTE — Telephone Encounter (Signed)
 Diabetic shoes and inserts are in [place] office. Spoke to Javier Hunt and scheduled him on 06/17/2024 to pick up

## 2024-06-17 ENCOUNTER — Ambulatory Visit

## 2024-06-17 DIAGNOSIS — L97411 Non-pressure chronic ulcer of right heel and midfoot limited to breakdown of skin: Secondary | ICD-10-CM

## 2024-06-17 DIAGNOSIS — E1022 Type 1 diabetes mellitus with diabetic chronic kidney disease: Secondary | ICD-10-CM

## 2024-06-17 DIAGNOSIS — M2142 Flat foot [pes planus] (acquired), left foot: Secondary | ICD-10-CM

## 2024-06-17 DIAGNOSIS — E11610 Type 2 diabetes mellitus with diabetic neuropathic arthropathy: Secondary | ICD-10-CM

## 2024-06-17 DIAGNOSIS — M2141 Flat foot [pes planus] (acquired), right foot: Secondary | ICD-10-CM

## 2024-06-17 DIAGNOSIS — I129 Hypertensive chronic kidney disease with stage 1 through stage 4 chronic kidney disease, or unspecified chronic kidney disease: Secondary | ICD-10-CM

## 2024-06-17 DIAGNOSIS — E10621 Type 1 diabetes mellitus with foot ulcer: Secondary | ICD-10-CM

## 2024-06-17 DIAGNOSIS — N184 Chronic kidney disease, stage 4 (severe): Secondary | ICD-10-CM

## 2024-06-17 NOTE — Progress Notes (Signed)
" ° °  The patient presented to the office to day to pick up custom diabetic shoes and 2 pr diabetic custom inserts.  1 pr of  inserts were put in the shoes and the shoes were fitted to the patient.   The foot ortheses offered full contact with plantar surface and contoured the arch well.   The shoes fit well with no heel slippage or areas of pressure concern.   Instructions for break in and wear were dispensed as well as instructions for changing out the diabetic insoles every 6 months.  The  delivery documentation and break in instruction forms were signed and a copy of the paperwork was given to the patient.    Patient advised to contact us  if any problems arise.  Patient also advised on how to report any issues  "

## 2024-06-29 ENCOUNTER — Encounter (INDEPENDENT_AMBULATORY_CARE_PROVIDER_SITE_OTHER): Admitting: Ophthalmology

## 2024-07-03 ENCOUNTER — Ambulatory Visit: Admitting: Family

## 2024-07-08 ENCOUNTER — Encounter (INDEPENDENT_AMBULATORY_CARE_PROVIDER_SITE_OTHER): Admitting: Ophthalmology

## 2024-07-08 DIAGNOSIS — Z794 Long term (current) use of insulin: Secondary | ICD-10-CM | POA: Diagnosis not present

## 2024-07-08 DIAGNOSIS — H43813 Vitreous degeneration, bilateral: Secondary | ICD-10-CM

## 2024-07-08 DIAGNOSIS — I10 Essential (primary) hypertension: Secondary | ICD-10-CM | POA: Diagnosis not present

## 2024-07-08 DIAGNOSIS — H35033 Hypertensive retinopathy, bilateral: Secondary | ICD-10-CM | POA: Diagnosis not present

## 2024-07-08 DIAGNOSIS — H2513 Age-related nuclear cataract, bilateral: Secondary | ICD-10-CM

## 2024-07-08 DIAGNOSIS — E103513 Type 1 diabetes mellitus with proliferative diabetic retinopathy with macular edema, bilateral: Secondary | ICD-10-CM

## 2024-08-05 ENCOUNTER — Ambulatory Visit: Admitting: Family

## 2024-08-12 ENCOUNTER — Encounter (INDEPENDENT_AMBULATORY_CARE_PROVIDER_SITE_OTHER): Admitting: Ophthalmology

## 2024-08-14 ENCOUNTER — Ambulatory Visit (HOSPITAL_BASED_OUTPATIENT_CLINIC_OR_DEPARTMENT_OTHER): Admitting: Pulmonary Disease

## 2024-10-29 ENCOUNTER — Ambulatory Visit
